# Patient Record
Sex: Female | Born: 1940
Health system: Southern US, Community
[De-identification: ages and names within clinical notes are randomized; demographics above are authoritative.]

## PROBLEM LIST (undated history)

## (undated) DIAGNOSIS — I10 Essential (primary) hypertension: Secondary | ICD-10-CM

## (undated) DIAGNOSIS — H539 Unspecified visual disturbance: Secondary | ICD-10-CM

## (undated) HISTORY — PX: CHOLECYSTECTOMY, LAPAROSCOPIC: SHX56

## (undated) HISTORY — DX: Unspecified visual disturbance: H53.9

## (undated) NOTE — *Deleted (*Deleted)
Postherpetic Neuralgia Postherpetic neuralgia (PHN) is nerve pain that occurs after a shingles infection. Shingles is a painful rash that appears on one area of the body, usually on the trunk or face. Shingles is caused by the varicella-zoster virus. This is the same virus that causes chickenpox. In people who have had chickenpox, the virus can resurface years later and cause shingles. You may have PHN if you continue to have pain for 4 months after your shingles rash has gone away. PHN appears in the same area where you had the shingles rash. The pain usually goes away after the rash disappears. Getting a vaccination for shingles can prevent PHN. This vaccine is recommended for people older than 60. It may prevent shingles, and may also lower your risk of PHN if you do get shingles. What are the causes? This condition is caused by damage to your nerves from the varicella-zoster virus. The damage makes your nerves overly sensitive. What increases the risk? The following factors may make you more likely to develop this condition:  Being older than 60 years of age.  Having severe pain before your shingles rash starts.  Having a severe rash.  Having shingles in and around the eye area.  Having a disease that makes your body unable to fight infections (weak immune system). What are the signs or symptoms? The main symptom of this condition is pain. The pain may:  Often be very bad and may be described as stabbing, burning, or feeling like an electric shock.  Come and go or may be there all the time.  Be triggered by light touches on the skin or changes in temperature. You may have itching along with the pain. How is this diagnosed? This condition may be diagnosed based on your symptoms and your history of shingles. Lab studies and other diagnostic tests are usually not needed. How is this treated? There is no cure for this condition. Treatment for PHN will focus on pain relief.  Over-the-counter pain relievers do not usually relieve PHN pain. You may need to work with a pain specialist. Treatment may include:  Antidepressant medicines to help with pain and improve sleep.  Anti-seizure medicines to relieve nerve pain.  Strong pain relievers (opioids).  A numbing patch worn on the skin (lidocaine patch).  Botox (botulinum toxin) injections to block pain signals between nerves and muscles.  Injections of numbing medicine or anti-inflammatory medicines around irritated nerves. Follow these instructions at home:   It may take a long time to recover from PHN. Work closely with your health care provider and develop a good support system at home.  Take over-the-counter and prescription medicines only as told by your health care provider.  Do not drive or use heavy machinery while taking prescription pain medicine.  Wear loose, comfortable clothing.  Cover sensitive areas with a dressing to reduce friction from clothing rubbing on the area.  If directed, put ice on the painful area: ? Put ice in a plastic bag. ? Place a towel between your skin and the bag. ? Leave the ice on for 20 minutes, 2-3 times a day.  Talk to your health care provider if you feel depressed or desperate. Living with long-term pain can be depressing.  Keep all follow-up visits as told by your health care provider. This is important. Contact a health care provider if:  Your medicine is not helping.  You are struggling to manage your pain at home. Summary  Postherpetic neuralgia is a very painful disorder   that can occur after an episode of shingles.  The pain is often severe, burning, electric, or stabbing.  Prescription medicines can be helpful in managing persistent pain.  Getting a vaccination for shingles can prevent PHN. This vaccine is recommended for people older than 60. This information is not intended to replace advice given to you by your health care provider. Make sure  you discuss any questions you have with your health care provider. Document Revised: 03/15/2017 Document Reviewed: 06/19/2016 Elsevier Patient Education  2020 [employer] Inc.  

---

## 1998-04-28 ENCOUNTER — Ambulatory Visit (HOSPITAL_COMMUNITY): Admission: RE | Admit: 1998-04-28 | Discharge: 1998-04-28 | Payer: Self-pay | Admitting: Family Medicine

## 1998-04-28 ENCOUNTER — Encounter: Payer: Self-pay | Admitting: Family Medicine

## 1999-09-20 ENCOUNTER — Encounter: Payer: Self-pay | Admitting: Family Medicine

## 1999-09-20 ENCOUNTER — Ambulatory Visit (HOSPITAL_COMMUNITY): Admission: RE | Admit: 1999-09-20 | Discharge: 1999-09-20 | Payer: Self-pay | Admitting: Family Medicine

## 1999-11-29 ENCOUNTER — Ambulatory Visit (HOSPITAL_COMMUNITY): Admission: RE | Admit: 1999-11-29 | Discharge: 1999-11-29 | Payer: Self-pay | Admitting: *Deleted

## 1999-11-29 ENCOUNTER — Encounter: Payer: Self-pay | Admitting: *Deleted

## 1999-12-15 ENCOUNTER — Encounter: Payer: Self-pay | Admitting: General Surgery

## 1999-12-20 ENCOUNTER — Encounter (INDEPENDENT_AMBULATORY_CARE_PROVIDER_SITE_OTHER): Payer: Self-pay | Admitting: Specialist

## 1999-12-20 ENCOUNTER — Observation Stay (HOSPITAL_COMMUNITY): Admission: RE | Admit: 1999-12-20 | Discharge: 1999-12-21 | Payer: Self-pay | Admitting: General Surgery

## 2000-12-02 ENCOUNTER — Encounter: Payer: Self-pay | Admitting: Family Medicine

## 2000-12-02 ENCOUNTER — Encounter: Admission: RE | Admit: 2000-12-02 | Discharge: 2000-12-02 | Payer: Self-pay | Admitting: Family Medicine

## 2001-07-21 ENCOUNTER — Encounter: Payer: Self-pay | Admitting: Family Medicine

## 2001-07-21 ENCOUNTER — Ambulatory Visit (HOSPITAL_COMMUNITY): Admission: RE | Admit: 2001-07-21 | Discharge: 2001-07-21 | Payer: Self-pay | Admitting: Family Medicine

## 2003-03-15 ENCOUNTER — Emergency Department (HOSPITAL_COMMUNITY): Admission: EM | Admit: 2003-03-15 | Discharge: 2003-03-15 | Payer: Self-pay | Admitting: Emergency Medicine

## 2008-04-22 ENCOUNTER — Other Ambulatory Visit: Admission: RE | Admit: 2008-04-22 | Discharge: 2008-04-22 | Payer: Self-pay | Admitting: Family Medicine

## 2010-09-01 NOTE — Op Note (Signed)
Operating Room Services  Patient:    Kathleen Schneider, Kathleen Schneider                      MRN: [id number] Proc. Date: 12/20/99 Adm. Date:  [id number] Attending:  Autumn Messing S                           Operative Report  PREOPERATIVE DIAGNOSIS:  Symptomatic cholelithiasis.  POSTOPERATIVE DIAGNOSIS:  Symptomatic cholelithiasis.  PROCEDURE:  Laparoscopic cholecystectomy.  SURGEON:  Autumn Messing, M.D.  ASSISTANT:  Fenton Malling. Lucia Gaskins, M.D.  ANESTHESIA:  General endotracheal.  DESCRIPTION OF PROCEDURE:  After informed consent was obtained, the patient was brought to the operating room and placed in the supine position on the operating table.  After adequate induction of general anesthesia, the patients abdomen was prepped with Betadine and draped in the usual sterile manner.  A small transverse supraumbilical incision was made with the 15 blade knife.  This was carried down through the skin and the subcutaneous tissue, and then blunt dissection was used to identify the anterior abdominal fascia. The fascia was incised with the 15 blade knife and each end was grasped with Kocher clamps.  Kocher clamps were elevated and blunt dissection through the fat and peritoneum allowed entry into the abdominal cavity.  On digital exam, there were no adhesions superiorly in the abdominal cavity, but there did appear to be some inferior to the incision.  These were not taken down since they were not in the way.  The laparoscope was then inserted.  A 0 Vicryl pursestring stitch was placed in the fascia around this incision and a Hassan cannula was placed through this hole into the abdominal cavity.  It was then anchored with the two ends of the Vicryl pursestring.  The abdomen was then insufflated with carbon dioxide.  The laparoscope was inserted through the [facility] cannula and the liver edge was easily identified.  Next, 0.25% Marcaine was injected into the skin and subcutaneous tissue, and along the  peritoneum in the superior position and a superior midline incision was then made with the 15 blade knife through the skin and subcutaneous tissue.  A 10 mm port was then placed through this incision bluntly into the abdominal cavity under direct vision with the laparoscope.  Next, two sites were chosen for lateral port placements and these areas were injected with 0.25% Marcaine.  Small incisions were made laterally on the right side of the abdomen in these areas with the 15 blade.  Five millimeter ports were then placed through these incisions into the abdominal cavity and under direct vision with the laparoscope.  The patient was placed in the head up position and ruled slightly laterally with the left side down.  This allowed better visualization of the dome of the gallbladder.  A blunt grasper was then placed through the lateral most 5 mm port and used to grasp the dome of the gallbladder.  A blunt grasper was then placed through the other lateral port and used to grasp and retract the fundus of the gallbladder.  The [place] dissector was placed through the superior midline port.  Using the [facility] with the electrocautery, the peritoneal reflection over top it, at the base of the gallbladder was opened.  Once this was done, the [place] was used to bluntly dissect around the area of the gallbladder neck. The gallbladder neck and cystic duct junction was readily identified, and was  dissected bluntly in a circumferential manner.  The common duct was kept medial to this dissection.  Three clips were placed proximally and one distally on the cystic duct and laparoscopic scissors were used to divide the duct between the two sets of clips.  The cystic artery was then identified posterior to this, and again, dissected bluntly in a circumferential manner using the [place].  Two clips were placed proximally and one distally on the artery, and the artery was divided with  laparoscopic scissors between the two.  Next, a hook cautery was used to free the gallbladder from the liver bed using a combination of blunt dissection and sharp dissection with the Bovie electrocautery.  Before removing the gallbladder completely from the liver bed, the liver bed was inspected and there were several places that required use of the electrocautery for hemostasis.  One small area required placement of Surgicel in it and holding the pressure for several minutes.  The gallbladder was then removed the rest of the way from the liver bed using the Bovie electrocautery.  The laparoscope was then moved to the superior midline port and the endoscopic bag was placed through the [facility] cannula.  Under direct vision, the gallbladder was placed in the endoscopic bag and removed from the abdomen through the umbilical port.  The Hassan cannula was then replaced through the umbilical port and the liver bed was inspected once more and found to be hemostatic.  The abdomen was irrigated with copious amounts of saline.  The ports were removed under direct vision and were found to be hemostatic.  The supraumbilical port was closed with the previously placed Vicryl pursestring suture.  The skin was closed with interrupted subcuticular stitches using 4-0 Monocryl.  Benzoin and Steri-Strips were applied.  The patient tolerated the procedure well.  At the end of the case, all needle, sponge, and instrument counts were correct.  The patient was taken to the recovery room in stable condition. DD:  12/20/99 TD:  12/21/99 Job: [id number]

## 2011-02-23 ENCOUNTER — Other Ambulatory Visit (HOSPITAL_COMMUNITY)
Admission: RE | Admit: 2011-02-23 | Discharge: 2011-02-23 | Disposition: A | Discharge status: Home / Self Care | Payer: Self-pay | Source: Ambulatory Visit | Attending: Family Medicine | Admitting: Family Medicine

## 2011-02-23 ENCOUNTER — Other Ambulatory Visit: Payer: Self-pay | Admitting: Family Medicine

## 2011-02-23 DIAGNOSIS — Z124 Encounter for screening for malignant neoplasm of cervix: Secondary | ICD-10-CM | POA: Insufficient documentation

## 2015-02-18 ENCOUNTER — Ambulatory Visit (INDEPENDENT_AMBULATORY_CARE_PROVIDER_SITE_OTHER): Payer: PPO | Admitting: Neurology

## 2015-02-18 ENCOUNTER — Telehealth: Payer: Self-pay

## 2015-02-18 ENCOUNTER — Encounter: Payer: Self-pay | Admitting: Neurology

## 2015-02-18 ENCOUNTER — Ambulatory Visit: Payer: Self-pay | Admitting: Neurology

## 2015-02-18 VITALS — BP 104/66 | HR 78 | Resp 20 | Ht 64.0 in | Wt 235.0 lb

## 2015-02-18 DIAGNOSIS — B0229 Other postherpetic nervous system involvement: Secondary | ICD-10-CM | POA: Insufficient documentation

## 2015-02-18 DIAGNOSIS — G4700 Insomnia, unspecified: Secondary | ICD-10-CM | POA: Diagnosis not present

## 2015-02-18 DIAGNOSIS — R0683 Snoring: Secondary | ICD-10-CM

## 2015-02-18 DIAGNOSIS — R208 Other disturbances of skin sensation: Secondary | ICD-10-CM | POA: Insufficient documentation

## 2015-02-18 MED ORDER — LAMOTRIGINE 100 MG PO TABS: 100.0000 mg | ORAL_TABLET | Freq: Every day | ORAL | Status: DC

## 2015-02-18 MED ORDER — LAMOTRIGINE 25 MG PO TABS: ORAL_TABLET | ORAL | Status: DC

## 2015-02-18 MED ORDER — LIDOCAINE 5 % EX PTCH
2.0000 | MEDICATED_PATCH | CUTANEOUS | Status: DC
Start: 2015-02-18 — End: 2015-05-24

## 2015-02-18 NOTE — Progress Notes (Signed)
[facility]  PATIENT: Kathleen Schneider DOB: Jul 04, 1940  REFERRING DOCTOR OR PCP:  Dr. Nancy Fetter SOURCE: patient and records from Dr. Nancy Fetter  _________________________________   HISTORICAL  CHIEF COMPLAINT:  Chief Complaint  Patient presents with  . Postherpetic Neuralgia    Sts. she had shingles in August 2014, since then has had pain right lower back radiating around toright hip, and right lateral rib pain.  Sts. pain is aggravated by waistband of pants.  Sts. she is currently taking Gabapentin 300mg  tid and sts. this brings pain from an 8 to a 5 on 1-10 pain scale.  She would like to discuss other tx. options that might provide more relief/fim.    HISTORY OF PRESENT ILLNESS:   I had the pleasure seeing you patient, Kathleen Schneider, at [facility] for a neurologic consultation regarding her post herpetic neuralgia.     In mid 2014, she had the onset of lower thoracic pain on the right.   She saw her PCP 2 weeks later and was told that she had also had evidence of a recent rash in that distribution.   Pain has been persistent.   She notes that touch feels like gait burning prickly pain.    Clothes rubbing against her lower abdomen/flank increases the pain.   She was prescribed gabapentin.  She feels it helps pain to reduce from an 8 to a 5/10.   She takes 300 x 3 tablets most days (7 am, 3 pm, 11 pm) but sometimes will take a 4th pill if she will be out of the house/.   She generally tolerates it well but is sometimes sleepy.   She has never tried Lidoderm and has not tried any other medication besides gabapentin.     LBP:   She sometimes has pain in her back, unrelatred to her flank pain from the PHN.  Insomnia:  She usually falls asleep easily but often wakes up and has trouble falling back asleep.  She wakes up mildly unrefreshed many days.    She often naps later in the day and occasionally dozes off watching TV.Marland Kitchen  She has been told that she snores  She has  never been told she snorts or gasps.   However, she wakes up catching her breath  on rare occasions    REVIEW OF SYSTEMS: Constitutional: No fevers, chills, sweats, or change in appetite Eyes: No visual changes, double vision, eye pain Ear, nose and throat: No hearing loss, ear pain, nasal congestion, sore throat Cardiovascular: No chest pain, palpitations Respiratory: No shortness of breath at rest or with exertion.   No wheezes GastrointestinaI: No nausea, vomiting, diarrhea, abdominal pain, fecal incontinence Genitourinary: No dysuria, urinary retention or frequency.  No nocturia. Musculoskeletal: No neck pain, back pain Integumentary: No rash, pruritus, skin lesions Neurological: as above Psychiatric: No depression at this time.  No anxiety Endocrine: No palpitations, diaphoresis, change in appetite, change in weigh or increased thirst Hematologic/Lymphatic: No anemia, purpura, petechiae. Allergic/Immunologic: No itchy/runny eyes, nasal congestion, recent allergic reactions, rashes  ALLERGIES: No Known Allergies  HOME MEDICATIONS:  Current outpatient prescriptions:  .  aspirin 325 MG tablet, Take 325 mg by mouth daily., Disp: , Rfl:  .  gabapentin (NEURONTIN) 300 MG capsule, Take 300 mg by mouth 3 (three) times daily., Disp: , Rfl:   PAST MEDICAL HISTORY: Past Medical History  Diagnosis Date  . Vision abnormalities     PAST SURGICAL HISTORY: Past Surgical History  Procedure Laterality Date  .  Cholecystectomy, laparoscopic      FAMILY HISTORY: Family History  Problem Relation Age of Onset  . Seizures Mother   . Alcoholism Father     SOCIAL HISTORY:  Social History   Social History  . Marital Status: Married    Spouse Name: N/A  . Number of Children: N/A  . Years of Education: N/A   Occupational History  . Not on file.   Social History Main Topics  . Smoking status: Former Research scientist (life sciences)  . Smokeless tobacco: Not on file  . Alcohol Use: 0.0 oz/week    0  Standard drinks or equivalent per week     Comment: Rare--about twice per yr/fim  . Drug Use: No  . Sexual Activity: Not on file   Other Topics Concern  . Not on file   Social History Narrative  . No narrative on file     PHYSICAL EXAM  Filed Vitals:   02/18/15 1029  BP: 104/66  Pulse: 78  Resp: 20  Height: 5\' 4"  (1.626 m)  Weight: 235 lb (106.595 kg)    Body mass index is 40.32 kg/(m^2).   General: The patient is well-developed and well-nourished and in no acute distress  Neck: The neck is supple, no carotid bruits are noted.  The neck is nontender.  Cardiovascular: The heart has a regular rate and rhythm with a normal S1 and S2. There were no murmurs, gallops or rubs.   Skin: No rashes.  Musculoskeletal:  Back is nontender  Neurologic Exam  Mental status: The patient is alert and oriented x 3 at the time of the examination. The patient has apparent normal recent and remote memory, with an apparently normal attention span and concentration ability.   Speech is normal.  Cranial nerves: Extraocular movements are full.   There is good facial sensation to soft touch bilaterally.Facial strength is normal.  Trapezius and sternocleidomastoid strength is normal. No dysarthria is noted.  The tongue is midline, and the patient has symmetric elevation of the soft palate. No obvious hearing deficits are noted.  Motor:  Muscle bulk is normal.   Tone is normal. Strength is  5 / 5 in all 4 extremities.   Sensory: Sensory testing is intact to pinprick, soft touch and vibration sensation in  The limbs. She has  Hyperpathia and allodynia over the right T11 dermatome  Coordination: Cerebellar testing reveals good finger-nose-finger and heel-to-shin bilaterally.  Gait and station: Station is normal.   Gait is normal. Tandem gait is normal. Romberg is negative.   Reflexes: Deep tendon reflexes are symmetric and normal bilaterally.   Plantar responses are flexor.    DIAGNOSTIC DATA  (LABS, IMAGING, TESTING) - I reviewed patient records, labs, notes, testing and imaging myself where available.      ASSESSMENT AND PLAN  Postherpetic neuralgia  Dysesthesia  Insomnia  Snoring  In summary, Marylisa Kalivas is a 74 year old woman with postherpetic neuralgia for the past 2 years that has not responded well enough 2 gabapentin. The dermatomal distribution appears to be the right T11 dermatome though cannot rule out that it is actually T10 or T12.     We discussed various options. She is most interested in a procedure that would hopefully get rid of the pain permanently. I think that she should try some other treatments before going down that route. I have prescribed Lidoderm patches and will also titrate lamotrigine up to 100 mg by mouth twice a day as it is sometimes more beneficial than gabapentin for  PHN. She will continue on the gabapentin. If this interaction is not beneficial, I could refer her to a pain management Center for  Pulsed RFA of the affected dorsal root ganglion or other procedure.  A second problem is insomnia, snoring and sleepiness. I discussed with her that she could have obstructive sleep apnea and I would consider getting a sleep study if her excessive daytime sleepiness worsens.  She will return to see me in 3 months or sooner if there are new or worsening neurologic symptoms.  Thank you for asking me to see Mrs. Oestreicher for a neurologic consultation. Please let me know if I can be of further assistance with her or other patients in the future.    Isak Sotomayor A. Felecia Shelling, MD, PhD [id number], [id number] AM Certified in Neurology, Clinical Neurophysiology, Sleep Medicine, Pain Medicine and Neuroimaging  [facility] [address] [place], [place] [address] [phone]

## 2015-02-18 NOTE — Patient Instructions (Signed)
The pharmacy has the prescription of lamotrigine 25 mg. Please take it as follows: For 1 week take 1 pill once a day. The second week, take 1 pill twice a day. The third week, take 1 pill in the morning and 2 at bedtime or evening. The fourth week take 2 pills twice a day.  I have also give you a paper prescription for lamotrigine 100 mg tablets after the first 4 weeks you should take 100 mg twice a day.  Most people tolerate lamotrigine very well. Some will get a rash. If you do get a significant rash stop the medicine immediately and let us know.

## 2015-02-18 NOTE — Telephone Encounter (Signed)
Called pt to ask her to r/s her appt for today. Dr. Jaynee Eagles is not feeling well. Home number listed kept ringing busy. Left message on a cell phone asking her to call us back as soon as she gets the message.

## 2015-03-22 ENCOUNTER — Telehealth: Payer: Self-pay | Admitting: Neurology

## 2015-03-22 MED ORDER — LAMOTRIGINE 100 MG PO TABS: 100.0000 mg | ORAL_TABLET | Freq: Two times a day (BID) | ORAL | Status: DC

## 2015-03-22 NOTE — Telephone Encounter (Signed)
It appears the 25mg  was a starting titration dose.  Patient is to transition to 100mg  tabs once titration os complete, a Rx was written at last OV. (Ov note says: lamotrigine up to 100 mg by mouth twice a day)  I called back and spoke with the patient.  She asked that we reprocess Rx, as she does not have the Rx written on 11/04.  Rx has been resent.  Receipt confirmed by pharmacy.

## 2015-03-22 NOTE — Telephone Encounter (Signed)
Pt called requesting refill for lamoTRIgine (LAMICTAL) 25 MG tablet .

## 2015-05-23 DIAGNOSIS — Z6841 Body Mass Index (BMI) 40.0 and over, adult: Secondary | ICD-10-CM | POA: Diagnosis not present

## 2015-05-23 DIAGNOSIS — B0229 Other postherpetic nervous system involvement: Secondary | ICD-10-CM | POA: Diagnosis not present

## 2015-05-24 ENCOUNTER — Encounter: Payer: Self-pay | Admitting: *Deleted

## 2015-05-24 ENCOUNTER — Telehealth: Payer: Self-pay | Admitting: Neurology

## 2015-05-24 ENCOUNTER — Ambulatory Visit (INDEPENDENT_AMBULATORY_CARE_PROVIDER_SITE_OTHER): Payer: PPO | Admitting: Neurology

## 2015-05-24 ENCOUNTER — Encounter: Payer: Self-pay | Admitting: Neurology

## 2015-05-24 VITALS — BP 132/78 | HR 72 | Resp 20 | Ht 64.0 in | Wt 247.0 lb

## 2015-05-24 DIAGNOSIS — G4719 Other hypersomnia: Secondary | ICD-10-CM | POA: Diagnosis not present

## 2015-05-24 DIAGNOSIS — R0683 Snoring: Secondary | ICD-10-CM

## 2015-05-24 DIAGNOSIS — B0229 Other postherpetic nervous system involvement: Secondary | ICD-10-CM | POA: Diagnosis not present

## 2015-05-24 DIAGNOSIS — R208 Other disturbances of skin sensation: Secondary | ICD-10-CM

## 2015-05-24 DIAGNOSIS — G4700 Insomnia, unspecified: Secondary | ICD-10-CM

## 2015-05-24 MED ORDER — LAMOTRIGINE 150 MG PO TABS: 150.0000 mg | ORAL_TABLET | Freq: Two times a day (BID) | ORAL | Status: DC

## 2015-05-24 MED ORDER — LIDOCAINE 5 % EX PTCH: 2.0000 | MEDICATED_PATCH | CUTANEOUS | Status: DC

## 2015-05-24 NOTE — Patient Instructions (Signed)
Pick up the new lamotrigine prescription. Take 150 mg twice a day.  If you want to try a higher dose given [name] a call and we can call in the 200 mg to take twice a day.  If you're daytime sleepiness worsens, reconsider getting a sleep study.  I will see you back in 4 months but call sooner if you have any problems.

## 2015-05-24 NOTE — Progress Notes (Signed)
[facility]  PATIENT: Kathleen Schneider DOB: 1941-04-03  REFERRING DOCTOR OR PCP:  Dr. Nancy Fetter SOURCE: patient and records from Dr. Nancy Fetter  _________________________________   HISTORICAL  CHIEF COMPLAINT:  Chief Complaint  Patient presents with  . Postherpetic Neuralgia    Sts. she has continued Gabapentin bid, and is taking Lamictal as rx'd.  Lidoderm patches weren't covered by insurance so she didn't start them.  She sts. pain is about 50% better./fim    HISTORY OF PRESENT ILLNESS:  Kathleen Schneider is a 75 year old woman with post herpetic neuralgia.     PHN Dysesthesia:  She has PHN pain in the lower thoracic region on the right.  The addition of Lamictal to her gabapentin has resulted in about a 50% improvement of her pain.  The area that is painful seems smaller.     Lidoderm patches were too expensive so she has not tried those.    She reduced gabapentin to bid so both med's, would be bid.   Although pain is better, she still has allodynia and close or anything rubbing against her right flank hurts.  PHN / Dysesthesia histoet:   In mid 2014, she had the onset of lower thoracic pain on the right.   She saw her PCP 2 weeks later and was told that she had also had evidence of a recent rash in that distribution.   Pain has been persistent.   She notes that touch feels like gait burning prickly pain.    Clothes rubbing against her lower abdomen/flank increases the pain.   She was prescribed gabapentin.  She felt it helped reduce from an 8 to a 5/10.   It made her sleepy.   Lamotrigine was added with benefit.     Insomnia:  She feels she is sleeping deeper on current medications.    She has nocturia x 3.   She often wakes up and has trouble falling back asleep once awake.    She falls asleep easily at the sleep onset.    She wakes up mildly unrefreshed many days.    She often naps later in the day and occasionally dozes off watching TV.Marland Kitchen  She has been told that she snores  She  occasioanally wakes up catching her breath  on rare occasions    Excessive daytime sleepiness EPWORTH SLEEPINESS SCALE  On a scale of 0 - 3 what is the chance of dozing:  Sitting and Reading:   3 Watching TV (IPAD):   2 Sitting inactive in a public place: 2 Passenger in car for one hour: 3 Lying down to rest in the afternoon: 3 Sitting and talking to someone: 0 Sitting quietly after lunch:  3 In a car, stopped in traffic:  0  Total (out of 24):    16/24  (moderatre sleepiness)   REVIEW OF SYSTEMS: Constitutional: No fevers, chills, sweats, or change in appetite Eyes: No visual changes, double vision, eye pain Ear, nose and throat: No hearing loss, ear pain, nasal congestion, sore throat Cardiovascular: No chest pain, palpitations Respiratory: No shortness of breath at rest or with exertion.   No wheezes GastrointestinaI: No nausea, vomiting, diarrhea, abdominal pain, fecal incontinence Genitourinary: No dysuria, urinary retention or frequency.  No nocturia. Musculoskeletal: No neck pain, back pain Integumentary: No rash, pruritus, skin lesions Neurological: as above Psychiatric: No depression at this time.  No anxiety Endocrine: No palpitations, diaphoresis, change in appetite, change in weigh or increased thirst Hematologic/Lymphatic: No anemia, purpura, petechiae. Allergic/Immunologic:  No itchy/runny eyes, nasal congestion, recent allergic reactions, rashes  ALLERGIES: No Known Allergies  HOME MEDICATIONS:  Current outpatient prescriptions:  .  gabapentin (NEURONTIN) 300 MG capsule, Take 300 mg by mouth 3 (three) times daily., Disp: , Rfl:  .  lamoTRIgine (LAMICTAL) 100 MG tablet, Take 1 tablet (100 mg total) by mouth 2 (two) times daily., Disp: 60 tablet, Rfl: 5 .  aspirin 325 MG tablet, Take 325 mg by mouth daily. Reported on 05/24/2015, Disp: , Rfl:  .  lidocaine (LIDODERM) 5 %, Place 2 patches onto the skin daily. Remove & Discard patch within 12 hours or as  directed by MD (Patient not taking: Reported on 05/24/2015), Disp: 60 patch, Rfl: 5  PAST MEDICAL HISTORY: Past Medical History  Diagnosis Date  . Vision abnormalities     PAST SURGICAL HISTORY: Past Surgical History  Procedure Laterality Date  . Cholecystectomy, laparoscopic      FAMILY HISTORY: Family History  Problem Relation Age of Onset  . Seizures Mother   . Alcoholism Father     SOCIAL HISTORY:  Social History   Social History  . Marital Status: Married    Spouse Name: N/A  . Number of Children: N/A  . Years of Education: N/A   Occupational History  . Not on file.   Social History Main Topics  . Smoking status: Former Research scientist (life sciences)  . Smokeless tobacco: Not on file  . Alcohol Use: 0.0 oz/week    0 Standard drinks or equivalent per week     Comment: Rare--about twice per yr/fim  . Drug Use: No  . Sexual Activity: Not on file   Other Topics Concern  . Not on file   Social History Narrative     PHYSICAL EXAM  Filed Vitals:   05/24/15 0850  BP: 132/78  Pulse: 72  Resp: 20  Height: 5\' 4"  (1.626 m)  Weight: 247 lb (112.038 kg)    Body mass index is 42.38 kg/(m^2).   General: The patient is well-developed and well-nourished and in no acute distress  HEENT:    She has a non-erythematous pharynx.    Soft palate is low lying.  Mallampati 3.    Skin: No rashes.  Musculoskeletal:  Back is nontender  Neurologic Exam  Mental status: The patient is alert and oriented x 3 at the time of the examination. The patient has apparent normal recent and remote memory, with an apparently normal attention span and concentration ability.   Speech is normal.  Cranial nerves: Extraocular movements are full.   There is good facial sensation to soft touch bilaterally.Facial strength is normal.  Trapezius and sternocleidomastoid strength is normal. No dysarthria is noted.  The tongue is midline, and the patient has symmetric elevation of the soft palate. No obvious hearing  deficits are noted.  Motor:  Muscle bulk is normal.   Tone is normal. Strength is  5 / 5 in all 4 extremities.   Sensory: Sensory testing is intact to pinprick, soft touch and vibration sensation in her limbs. She has  hyperpathia and allodynia over the right T11 or T12 dermatome  Coordination: Cerebellar testing reveals good finger-nose-finger and heel-to-shin bilaterally.  Gait and station: Station is normal.   Gait is normal. Tandem gait is normal. Romberg is negative.   Reflexes: Deep tendon reflexes are symmetric and normal bilaterally.         DIAGNOSTIC DATA (LABS, IMAGING, TESTING) - I reviewed patient records, labs, notes, testing and imaging myself where available.  ASSESSMENT AND PLAN   Postherpetic neuralgia  Dysesthesia  Insomnia  Snoring  Excessive daytime sleepiness   1.   Increase lamotrigine to 150 mg po bid and consider increase to 200 mg po bid depending on results (advised her to call in one month if she wants higher dose).   Can also increase gabapentin further.   2.   We discussed probable OSA but she would not wear CPAP so prefers not to be tested at this point.   Will reconsider if excessive sleepiness worsens.   I also discussed HST as option to PSG split night.    Try to lose weight as that might help.    3.   She will return to see me in 4 months or sooner if there are new or worsening neurologic symptoms.    Richard A. Felecia Shelling, MD, PhD [id number], [id number] Certified in Neurology, Clinical Neurophysiology, Sleep Medicine, Pain Medicine and Neuroimaging  [facility] [address] [place], [place] [address] [phone]

## 2015-05-24 NOTE — Telephone Encounter (Signed)
Patient is calling and states she needs a new Rx Lidocaine 5% as she never had the other prescription filled sent to [facility] on [address]. Also, she said she was looking at the list of medicaiton she takes at her appointment today and aspirins needs to be removed, also Gabapentin 300 mg needs to be changed to 2 X day.  Thanks!

## 2015-05-24 NOTE — Telephone Encounter (Signed)
New rx. for Lidocaine patches sent to [facility] on [address].  I have removed ASA from her med list an noted that she is only taking Gabapentin bid/fim

## 2015-05-25 ENCOUNTER — Encounter: Payer: Self-pay | Admitting: *Deleted

## 2015-05-25 NOTE — Telephone Encounter (Signed)
[facility] called to request a prior auth be done for medication lidocaine (LIDODERM) 5 % May call [phone]

## 2015-05-26 NOTE — Telephone Encounter (Signed)
Lidoderm patches are actually FDA indicated for post herpetic neuralgia so maybe she is able to get free medicine from the drug company.   If we can't get this, we can have her try lidocaine ointment

## 2015-05-26 NOTE — Telephone Encounter (Signed)
PA form for Lidoderm patches was completed and faxed to [facility] yesterday/fim

## 2015-05-31 ENCOUNTER — Encounter: Payer: Self-pay | Admitting: *Deleted

## 2015-09-21 ENCOUNTER — Encounter: Payer: Self-pay | Admitting: Neurology

## 2015-09-21 ENCOUNTER — Ambulatory Visit (INDEPENDENT_AMBULATORY_CARE_PROVIDER_SITE_OTHER): Payer: PPO | Admitting: Neurology

## 2015-09-21 VITALS — BP 138/88 | HR 74 | Resp 16 | Ht 64.0 in | Wt 242.0 lb

## 2015-09-21 DIAGNOSIS — G4719 Other hypersomnia: Secondary | ICD-10-CM

## 2015-09-21 DIAGNOSIS — R208 Other disturbances of skin sensation: Secondary | ICD-10-CM

## 2015-09-21 DIAGNOSIS — B0229 Other postherpetic nervous system involvement: Secondary | ICD-10-CM | POA: Diagnosis not present

## 2015-09-21 DIAGNOSIS — R0683 Snoring: Secondary | ICD-10-CM

## 2015-09-21 DIAGNOSIS — G4700 Insomnia, unspecified: Secondary | ICD-10-CM | POA: Diagnosis not present

## 2015-09-21 MED ORDER — LAMOTRIGINE 200 MG PO TABS: 200.0000 mg | ORAL_TABLET | Freq: Two times a day (BID) | ORAL | Status: DC

## 2015-09-21 NOTE — Progress Notes (Signed)
[facility]  PATIENT: Kathleen Schneider DOB: 1940-11-13  REFERRING DOCTOR OR PCP:  Dr. Nancy Fetter SOURCE: patient and records from Dr. Nancy Fetter  _________________________________   HISTORICAL  CHIEF COMPLAINT:  Chief Complaint  Patient presents with  . Post-herpetic Neuralgia    Sts. pain is improved--more than 50% with increase in lamictal and Lidocaine patches.  Today she would like to discuss bladder issues--both urinary frequency with occasional leaking if she doesn't make it to the bathroom in time, and difficutly emptying her bladder.  These are not new issues--she has just never been eval for them/fim    HISTORY OF PRESENT ILLNESS:  Kathleen Schneider is a 75 year old woman with post herpetic neuralgia and insomnia  PHN Dysesthesia:  She has PHN pain in the lower thoracic region on the right.  Increasing the lamotrigine to 150 mg po bid.     She was able to reduce gabapantin to just 2 a day.  She continue son Lidoderm .  The area that is painful seems to have shrunk .    She still has allodynia and close or anything rubbing against her right flank hurts.  PHN / Dysesthesia history from initial visit:   In mid 2014, she had the onset of lower thoracic pain on the right.   She saw her PCP 2 weeks later and was told that she had also had evidence of a recent rash in that distribution.   Pain has been persistent.   She notes that touch feels like gait burning prickly pain.    Clothes rubbing against her lower abdomen/flank increases the pain.   She was prescribed gabapentin.  She felt it helped reduce from an 8 to a 5/10.   It made her sleepy.   Lamotrigine was added with benefit.     Insomnia:  She has insomnia if she takes a nap (which is common).    She has nocturia x 3.   She often wakes up to urinate and has trouble falling back asleep once awake.  She has mixed urinary hesitancy and frequency  She falls asleep easily at the sleep onset.   She has been told that she snores  She  occasioanally wakes up catching her breath  on rare occasions    Excessive daytime sleepiness: EPWORTH SLEEPINESS SCALE  On a scale of 0 - 3 what is the chance of dozing:  Sitting and Reading:   3 Watching TV (IPAD):   2 Sitting inactive in a public place: 2 Passenger in car for one hour: 3 Lying down to rest in the afternoon: 3 Sitting and talking to someone: 0 Sitting quietly after lunch:  3 In a car, stopped in traffic:  0  Total (out of 24):    16/24  (moderatre sleepiness)   REVIEW OF SYSTEMS: Constitutional: No fevers, chills, sweats, or change in appetite.   She has excessive sleepiness. Eyes: No visual changes, double vision, eye pain Ear, nose and throat: No hearing loss, ear pain, nasal congestion, sore throat Cardiovascular: No chest pain, palpitations Respiratory: No shortness of breath at rest or with exertion.   No wheezes.   Has snoring GastrointestinaI: No nausea, vomiting, diarrhea, abdominal pain, fecal incontinence Genitourinary: No dysuria, urinary retention or frequency.  No nocturia. Musculoskeletal: No neck pain, back pain Integumentary: No rash, pruritus, skin lesions Neurological: as above Psychiatric: No depression at this time.  No anxiety Endocrine: No palpitations, diaphoresis, change in appetite, change in weigh or increased thirst Hematologic/Lymphatic: No anemia,  purpura, petechiae. Allergic/Immunologic: No itchy/runny eyes, nasal congestion, recent allergic reactions, rashes  ALLERGIES: No Known Allergies  HOME MEDICATIONS:  Current outpatient prescriptions:  .  gabapentin (NEURONTIN) 300 MG capsule, Take 300 mg by mouth 3 (three) times daily., Disp: , Rfl:  .  lamoTRIgine (LAMICTAL) 150 MG tablet, Take 1 tablet (150 mg total) by mouth 2 (two) times daily., Disp: 60 tablet, Rfl: 11 .  lidocaine (LIDODERM) 5 %, Place 2 patches onto the skin daily. Remove & Discard patch within 12 hours or as directed by MD, Disp: 60 patch, Rfl: 5  PAST  MEDICAL HISTORY: Past Medical History  Diagnosis Date  . Vision abnormalities     PAST SURGICAL HISTORY: Past Surgical History  Procedure Laterality Date  . Cholecystectomy, laparoscopic      FAMILY HISTORY: Family History  Problem Relation Age of Onset  . Seizures Mother   . Alcoholism Father     SOCIAL HISTORY:  Social History   Social History  . Marital Status: Married    Spouse Name: N/A  . Number of Children: N/A  . Years of Education: N/A   Occupational History  . Not on file.   Social History Main Topics  . Smoking status: Former Research scientist (life sciences)  . Smokeless tobacco: Not on file  . Alcohol Use: 0.0 oz/week    0 Standard drinks or equivalent per week     Comment: Rare--about twice per yr/fim  . Drug Use: No  . Sexual Activity: Not on file   Other Topics Concern  . Not on file   Social History Narrative     PHYSICAL EXAM  Filed Vitals:   09/21/15 0924  BP: 138/88  Pulse: 74  Resp: 16  Height: 5\' 4"  (1.626 m)  Weight: 242 lb (109.77 kg)    Body mass index is 41.52 kg/(m^2).   General: The patient is well-developed and well-nourished and in no acute distress  HEENT:    She has a non-erythematous pharynx.    Soft palate is low lying and pharynx is Mallampati 3.    Skin: No rashes.  Musculoskeletal:  Back is nontender  Neurologic Exam  Mental status: The patient is alert and oriented x 3 at the time of the examination. The patient has apparent normal recent and remote memory, with an apparently normal attention span and concentration ability.   Speech is normal.  Cranial nerves: Extraocular movements are full.   There is good facial sensation to soft touch bilaterally.Facial strength is normal.  Trapezius and sternocleidomastoid strength is normal. No dysarthria is noted.  The tongue is midline, and the patient has symmetric elevation of the soft palate. No obvious hearing deficits are noted.  Motor:  Muscle bulk is normal.   Tone is normal.  Strength is  5 / 5 in all 4 extremities.   Sensory: Sensory testing is intact to pinprick, soft touch and vibration sensation in her limbs. She has  hyperpathia and allodynia over the right T11 or T12 dermatome  Gait and station: Station is normal.   Gait is normal. Tandem gait is slightly wide, likely normal for age.. Romberg is negative.   Reflexes: Deep tendon reflexes are symmetric and normal bilaterally.         DIAGNOSTIC DATA (LABS, IMAGING, TESTING) - I reviewed patient records, labs, notes, testing and imaging myself where available.      ASSESSMENT AND PLAN   Postherpetic neuralgia  Dysesthesia   1.   Increase lamotrigine to 200 mg po bid.  Due to sleepiness, we won't  increase gabapentin further.   2.   We discussed probable OSA but she would not wear CPAP so prefers not to be tested at this point.   Will reconsider if excessive sleepiness worsens.   I also discussed HST as option to PSG split night.    Try to lose weight as that might help.    3.   She will return to see me in 4 months or sooner if there are new or worsening neurologic symptoms.    Richard A. Felecia Shelling, MD, PhD [id number], 0000000 AM Certified in Neurology, Clinical Neurophysiology, Sleep Medicine, Pain Medicine and Neuroimaging  [facility] [address] [place], [place] [address] [phone]

## 2016-01-06 DIAGNOSIS — Z1389 Encounter for screening for other disorder: Secondary | ICD-10-CM | POA: Diagnosis not present

## 2016-01-06 DIAGNOSIS — Z23 Encounter for immunization: Secondary | ICD-10-CM | POA: Diagnosis not present

## 2016-01-06 DIAGNOSIS — D1723 Benign lipomatous neoplasm of skin and subcutaneous tissue of right leg: Secondary | ICD-10-CM | POA: Diagnosis not present

## 2016-01-06 DIAGNOSIS — R05 Cough: Secondary | ICD-10-CM | POA: Diagnosis not present

## 2016-03-12 ENCOUNTER — Other Ambulatory Visit: Payer: Self-pay | Admitting: Neurology

## 2016-03-12 NOTE — Telephone Encounter (Signed)
Pt request refill for lidocaine (LIDODERM) 5 %

## 2016-03-22 ENCOUNTER — Ambulatory Visit: Payer: PPO | Admitting: Neurology

## 2016-03-27 ENCOUNTER — Ambulatory Visit (INDEPENDENT_AMBULATORY_CARE_PROVIDER_SITE_OTHER): Payer: PPO | Admitting: Neurology

## 2016-03-27 ENCOUNTER — Encounter: Payer: Self-pay | Admitting: Neurology

## 2016-03-27 DIAGNOSIS — R208 Other disturbances of skin sensation: Secondary | ICD-10-CM

## 2016-03-27 DIAGNOSIS — G4700 Insomnia, unspecified: Secondary | ICD-10-CM

## 2016-03-27 DIAGNOSIS — M545 Low back pain, unspecified: Secondary | ICD-10-CM

## 2016-03-27 DIAGNOSIS — G4719 Other hypersomnia: Secondary | ICD-10-CM

## 2016-03-27 DIAGNOSIS — R0683 Snoring: Secondary | ICD-10-CM

## 2016-03-27 DIAGNOSIS — B0229 Other postherpetic nervous system involvement: Secondary | ICD-10-CM | POA: Diagnosis not present

## 2016-03-27 MED ORDER — MELOXICAM 7.5 MG PO TABS: 7.5000 mg | ORAL_TABLET | Freq: Every day | ORAL | 5 refills | Status: DC

## 2016-03-27 NOTE — Progress Notes (Signed)
[facility]  PATIENT: Kathleen Schneider DOB: 1940-06-27  REFERRING DOCTOR OR PCP:  Dr. Nancy Fetter SOURCE: patient and records from Dr. Nancy Fetter  _________________________________   HISTORICAL  CHIEF COMPLAINT:  Chief Complaint  Patient presents with  . Postherpetic Neuralgia    Sts. neuralgia is improved since increasing Lamictal to 200mg  bid. She wasn't sure if she could continue Gabapentin when Lamictal was increased, so she stopped it.  She needs clarification--if she needs to continue off of Gabapentin, or can restart it./fim    HISTORY OF PRESENT ILLNESS:  Kathleen Schneider is a 75 year old woman with post herpetic neuralgia and insomnia  PHN Dysesthesia:  She has less PHN pain in the right lower thoracic region on the right on  lamotrigine 200 mg po bid.     She also takes gabapentin 300.  She is also on Lidoderm .  She has some allodynia still and clothes and rubbing against her right flank hurts.  PHN / Dysesthesia history from initial visit:   In mid 2014, she had the onset of lower thoracic pain on the right.   She saw her PCP 2 weeks later and was told that she had also had evidence of a recent rash in that distribution.   Pain has been persistent.   She notes that touch feels like gait burning prickly pain.    Clothes rubbing against her lower abdomen/flank increases the pain.   She was prescribed gabapentin.  She felt it helped reduce from an 8 to a 5/10.   It made her sleepy.   Lamotrigine was added with benefit.     LBP:   She has axial LBP that increases with prolonged standing and walking.   She finds that she stoops more when she walks and using a shopping cart helps.     Insomnia:  She has insomnia if she takes a nap (which is common).    She has nocturia x 3.   She often wakes up to urinate and has trouble falling back asleep once awake.  She has mixed urinary hesitancy and frequency  She falls asleep easily at the sleep onset.   She has been told that she snores   She occasionally wakes up catching her breath  on rare occasions.     Her Epworth score is 16/24  (moderate sleepiness)  Mood:   She has noted mild depression  REVIEW OF SYSTEMS: Constitutional: No fevers, chills, sweats, or change in appetite.   She has excessive sleepiness.and insomnia Eyes: No visual changes, double vision, eye pain Ear, nose and throat: No hearing loss, ear pain, nasal congestion, sore throat Cardiovascular: No chest pain, palpitations Respiratory: No shortness of breath at rest or with exertion.   No wheezes.   Has snoring GastrointestinaI: No nausea, vomiting, diarrhea, abdominal pain, fecal incontinence Genitourinary: No dysuria, urinary retention or frequency.  No nocturia. Musculoskeletal: No neck pain, back pain Integumentary: No rash, pruritus, skin lesions Neurological: as above Psychiatric: mild depression at this time.  No anxiety Endocrine: No palpitations, diaphoresis, change in appetite, change in weigh or increased thirst Hematologic/Lymphatic: No anemia, purpura, petechiae. Allergic/Immunologic: No itchy/runny eyes, nasal congestion, recent allergic reactions, rashes  ALLERGIES: No Known Allergies  HOME MEDICATIONS:  Current Outpatient Prescriptions:  .  lamoTRIgine (LAMICTAL) 200 MG tablet, Take 1 tablet (200 mg total) by mouth 2 (two) times daily., Disp: 60 tablet, Rfl: 11 .  lidocaine (LIDODERM) 5 %, Place 2 patches onto the skin daily. Remove & Discard patch  within 12 hours or as directed by MD, Disp: 60 patch, Rfl: 5 .  lidocaine (LIDODERM) 5 %, USE TWO PATCHES EXTERNALLY ONCE DAILY. REMOVE  AND DISCARD PATCH WITHIN 12 HOURS OR AS DIRECTED BY MD, Disp: 60 patch, Rfl: 5 .  gabapentin (NEURONTIN) 300 MG capsule, Take 300 mg by mouth 3 (three) times daily., Disp: , Rfl:  .  meloxicam (MOBIC) 7.5 MG tablet, Take 1 tablet (7.5 mg total) by mouth daily., Disp: 30 tablet, Rfl: 5  PAST MEDICAL HISTORY: Past Medical History:  Diagnosis Date  .  Vision abnormalities     PAST SURGICAL HISTORY: Past Surgical History:  Procedure Laterality Date  . CHOLECYSTECTOMY, LAPAROSCOPIC      FAMILY HISTORY: Family History  Problem Relation Age of Onset  . Seizures Mother   . Alcoholism Father     SOCIAL HISTORY:  Social History   Social History  . Marital status: Married    Spouse name: N/A  . Number of children: N/A  . Years of education: N/A   Occupational History  . Not on file.   Social History Main Topics  . Smoking status: Former Research scientist (life sciences)  . Smokeless tobacco: Not on file  . Alcohol use 0.0 oz/week     Comment: Rare--about twice per yr/fim  . Drug use: No  . Sexual activity: Not on file   Other Topics Concern  . Not on file   Social History Narrative  . No narrative on file     PHYSICAL EXAM  There were no vitals filed for this visit.  There is no height or weight on file to calculate BMI.   General: The patient is well-developed and well-nourished and in no acute distress   Skin: No rashes.  Musculoskeletal:  Back is mildly tender over paraspinal muscles  Neurologic Exam  Mental status: The patient is alert and oriented x 3 at the time of the examination. The patient has apparent normal recent and remote memory, with an apparently normal attention span and concentration ability.   Speech is normal.  Cranial nerves: Extraocular movements are full.   There is good facial sensation to soft touch bilaterally.Facial strength is normal.  Trapezius and sternocleidomastoid strength is normal. No dysarthria is noted.  The tongue is midline, and the patient has symmetric elevation of the soft palate. No obvious hearing deficits are noted.  Motor:  Muscle bulk is normal.   Tone is normal. Strength is  5 / 5 in all 4 extremities.   Sensory: Sensory testing is intact to pinprick, soft touch and vibration sensation in her arms. She has  hyperpathia and allodynia over the right T11 or T12 dermatome.   Mild reduced  left L5 sensation to touch  Gait and station: Station is normal.   Gait is normal. Tandem gait is slightly wide, likely normal for age.. Romberg is negative.   Reflexes: Deep tendon reflexes are symmetric and normal bilaterally.         DIAGNOSTIC DATA (LABS, IMAGING, TESTING) - I reviewed patient records, labs, notes, testing and imaging myself where available.      ASSESSMENT AND PLAN   Postherpetic neuralgia  Dysesthesia  Insomnia, unspecified type  Midline low back pain without sciatica, unspecified chronicity  Snoring  Excessive daytime sleepiness   1.   Continue lamotrigine to 200 mg po bid.    2.   She has probable OSA but she would not wear CPAP or oral appliance so prefers not to be tested at this  point.  Try to lose weight as that might help.    3.   If LBP worsens, we will check lumbar MRI to determine if there is spinal stenosis that may ned a procedure.   4.   She will return to see me in 4 months or sooner if there are new or worsening neurologic symptoms.    Azie Mcconahy A. Felecia Shelling, MD, PhD 87/18/3672, 55:00 PM Certified in Neurology, Clinical Neurophysiology, Sleep Medicine, Pain Medicine and Neuroimaging  [facility] [address] [place], [place] [address] [phone]

## 2016-08-13 ENCOUNTER — Telehealth: Payer: Self-pay | Admitting: Neurology

## 2016-08-13 MED ORDER — LIDOCAINE 5 % EX PTCH: 2.0000 | MEDICATED_PATCH | CUTANEOUS | 5 refills | Status: DC

## 2016-08-13 NOTE — Addendum Note (Signed)
Addended by: France Ravens I on: 08/13/2016 04:43 PM   Modules accepted: Orders

## 2016-08-13 NOTE — Telephone Encounter (Signed)
Lidoderm refilled as requested/fim

## 2016-08-13 NOTE — Telephone Encounter (Signed)
Patient called office requesting refills for lidocaine (LIDODERM) 5 %.

## 2016-09-25 ENCOUNTER — Encounter: Payer: Self-pay | Admitting: Neurology

## 2016-09-25 ENCOUNTER — Ambulatory Visit (INDEPENDENT_AMBULATORY_CARE_PROVIDER_SITE_OTHER): Payer: PPO | Admitting: Neurology

## 2016-09-25 VITALS — BP 146/85 | HR 94 | Resp 20 | Ht 64.0 in | Wt 229.0 lb

## 2016-09-25 DIAGNOSIS — M545 Low back pain, unspecified: Secondary | ICD-10-CM

## 2016-09-25 DIAGNOSIS — G4700 Insomnia, unspecified: Secondary | ICD-10-CM

## 2016-09-25 DIAGNOSIS — B0229 Other postherpetic nervous system involvement: Secondary | ICD-10-CM

## 2016-09-25 DIAGNOSIS — R208 Other disturbances of skin sensation: Secondary | ICD-10-CM | POA: Diagnosis not present

## 2016-09-25 DIAGNOSIS — M48061 Spinal stenosis, lumbar region without neurogenic claudication: Secondary | ICD-10-CM

## 2016-09-25 MED ORDER — LAMOTRIGINE 200 MG PO TABS: 200.0000 mg | ORAL_TABLET | Freq: Two times a day (BID) | ORAL | 11 refills | Status: DC

## 2016-09-25 MED ORDER — LIDOCAINE 5 % EX PTCH: 2.0000 | MEDICATED_PATCH | CUTANEOUS | 5 refills | Status: DC

## 2016-09-25 MED ORDER — MELOXICAM 7.5 MG PO TABS: 7.5000 mg | ORAL_TABLET | Freq: Every day | ORAL | 5 refills | Status: DC

## 2016-09-25 NOTE — Progress Notes (Signed)
[facility]  PATIENT: Kathleen Schneider DOB: December 22, 1940  REFERRING DOCTOR OR PCP:  Dr. Nancy Fetter SOURCE: patient and records from Dr. Nancy Fetter  _________________________________   HISTORICAL  CHIEF COMPLAINT:  Chief Complaint  Patient presents with  . Postherpetic Neuralgia    Sts. neuralgia (mainly right flank) cintinues to improve with Lamictal./fim    HISTORY OF PRESENT ILLNESS:  Kathleen Schneider is a 76 year old woman with post herpetic neuralgia and insomnia  PHN Dysesthesia:  She feels her PHN pain (in the right lower thoracic region) is doing bette on lamotrigine and Lidoderm.  She stopped gabapentin 300.  She is also on Lidoderm .  She has allodynia/hyperpathia triggered by clothes at times in that region.  This is also doing better.  PHN / Dysesthesia history from initial visit:   In mid 2014, she had the onset of lower thoracic pain on the right.   She saw her PCP 2 weeks later and was told that she had also had evidence of a recent rash in that distribution.   Pain has been persistent.   She notes that touch feels like gait burning prickly pain.    Clothes rubbing against her lower abdomen/flank increases the pain.   Gabapentin helped but made her sleepy.     Lamotrigine was added with benefit.     LBP:   LBP is worsening and now occurring daily.   While resting LBP is mild but it can intensify if she stands a while or with walking.   Pain is axial without radiation into legs. She finds that she stoops more when she walks and using a shopping cart helps.    She has never had any imaging.   Meloxicam helps slightly  Insomnia:  She often has insomnia, especially if she takes a nap (which is common).    She also has nocturia x 3.   She often wakes up to urinate and has trouble falling back asleep once awake.  She has mixed urinary hesitancy and frequency     She has been told that she snores  She occasionally wakes up catching her breath  on rare occasions.     Her Epworth  score is 16/24  (moderate sleepiness).   She is not interested in a PSG and would not wear CPAP or an oral appliance.  Mood:   She has noted mild depression but this has not worsened.     REVIEW OF SYSTEMS: Constitutional: No fevers, chills, sweats, or change in appetite.   EDS and insomnia Eyes: No visual changes, double vision, eye pain Ear, nose and throat: No hearing loss, ear pain, nasal congestion, sore throat Cardiovascular: No chest pain, palpitations Respiratory: No shortness of breath at rest or with exertion.   No wheezes.   She has snoring GastrointestinaI: No nausea, vomiting, diarrhea, abdominal pain, fecal incontinence Genitourinary: No dysuria, urinary retention or frequency.  No nocturia. Musculoskeletal: No neck pain, back pain Integumentary: No rash, pruritus, skin lesions Neurological: as above Psychiatric: mild depression at this time.  No anxiety Endocrine: No palpitations, diaphoresis, change in appetite, change in weigh or increased thirst Hematologic/Lymphatic: No anemia, purpura, petechiae. Allergic/Immunologic: No itchy/runny eyes, nasal congestion, recent allergic reactions, rashes  ALLERGIES: No Known Allergies  HOME MEDICATIONS:  Current Outpatient Prescriptions:  .  lamoTRIgine (LAMICTAL) 200 MG tablet, Take 1 tablet (200 mg total) by mouth 2 (two) times daily., Disp: 60 tablet, Rfl: 11 .  lidocaine (LIDODERM) 5 %, Place 2 patches onto the skin  daily. Remove & Discard patch within 12 hours or as directed by MD, Disp: 60 patch, Rfl: 5 .  meloxicam (MOBIC) 7.5 MG tablet, Take 1 tablet (7.5 mg total) by mouth daily., Disp: 30 tablet, Rfl: 5 .  gabapentin (NEURONTIN) 300 MG capsule, Take 300 mg by mouth 3 (three) times daily., Disp: , Rfl:   PAST MEDICAL HISTORY: Past Medical History:  Diagnosis Date  . Vision abnormalities     PAST SURGICAL HISTORY: Past Surgical History:  Procedure Laterality Date  . CHOLECYSTECTOMY, LAPAROSCOPIC      FAMILY  HISTORY: Family History  Problem Relation Age of Onset  . Seizures Mother   . Alcoholism Father     SOCIAL HISTORY:  Social History   Social History  . Marital status: Married    Spouse name: N/A  . Number of children: N/A  . Years of education: N/A   Occupational History  . Not on file.   Social History Main Topics  . Smoking status: Former Research scientist (life sciences)  . Smokeless tobacco: Never Used  . Alcohol use 0.0 oz/week     Comment: Rare--about twice per yr/fim  . Drug use: No  . Sexual activity: Not on file   Other Topics Concern  . Not on file   Social History Narrative  . No narrative on file     PHYSICAL EXAM  Vitals:   09/25/16 1100  BP: (!) 146/85  Pulse: 94  Resp: 20  Weight: 229 lb (103.9 kg)  Height: 5\' 4"  (1.626 m)    Body mass index is 39.31 kg/m.   General: The patient is well-developed and well-nourished and in no acute distress   Skin: No rashes.  Musculoskeletal:  Back is mildly tender over paraspinal muscles  Neurologic Exam  Mental status: The patient is alert and oriented x 3 at the time of the examination. The patient has apparent normal recent and remote memory, with an apparently normal attention span and concentration ability.   Speech is normal.  Cranial nerves: Extraocular movements are full.   There is good facial sensation to soft touch bilaterally.Facial strength is normal.  Trapezius and sternocleidomastoid strength is normal. No dysarthria is noted.  The tongue is midline, and the patient has symmetric elevation of the soft palate. No obvious hearing deficits are noted.  Motor:  Muscle bulk is normal.   Tone is normal. Strength is  5 / 5 in all 4 extremities.   Sensory:  She has intact sensation to touch and vibration in the arms. There is  hyperpathia and allodynia over the right T11 or T12 dermatome.   Mild reduced left L5 sensation to touch  Gait and station: Station is normal.   Gait is normal. Tandem gait is slightly wide, likely  normal for age.. Romberg is negative.   Reflexes: Deep tendon reflexes are symmetric and normal bilaterally.         DIAGNOSTIC DATA (LABS, IMAGING, TESTING) - I reviewed patient records, labs, notes, testing and imaging myself where available.      ASSESSMENT AND PLAN   Postherpetic neuralgia  Dysesthesia  Midline low back pain without sciatica, unspecified chronicity  Insomnia, unspecified type   1.   Postherpetic neuralgia, she will continue lamotrigine 200 mg by mouth twice a day and Lidoderm patches.  2.   We discussed her insomnia and probable sleep apnea. Even if she has OSA she would not wear CPAP or oral appliance so prefers not to be tested at this point.  We  discussed she should try to lose weight.   3.   LBP likely either facet hypertrophy or spinal stenosis and is not getting any better. Has numbness in left L5 distribution on examination. Check lumbar MRI to determine if there is spinal stenosis that may ned a procedure.    If facet hypertrophy, consider medial branch blocks/RFA.   If spinal stenosis, consider ESI. 4.   She will return to see me in 6 months or sooner if there are new or worsening neurologic symptoms.    Richard A. Felecia Shelling, MD, PhD 3/54/6568, 12:75 AM Certified in Neurology, Clinical Neurophysiology, Sleep Medicine, Pain Medicine and Neuroimaging  [facility] [address] [place], [place] [address] [phone]  She fee

## 2016-10-10 ENCOUNTER — Ambulatory Visit
Admission: RE | Admit: 2016-10-10 | Discharge: 2016-10-10 | Disposition: A | Discharge status: Home / Self Care | Payer: PPO | Source: Ambulatory Visit | Attending: Neurology | Admitting: Neurology

## 2016-10-10 DIAGNOSIS — M545 Low back pain, unspecified: Secondary | ICD-10-CM

## 2016-10-10 DIAGNOSIS — M48061 Spinal stenosis, lumbar region without neurogenic claudication: Secondary | ICD-10-CM

## 2016-10-15 ENCOUNTER — Telehealth: Payer: Self-pay | Admitting: *Deleted

## 2016-10-15 DIAGNOSIS — M48061 Spinal stenosis, lumbar region without neurogenic claudication: Secondary | ICD-10-CM

## 2016-10-15 DIAGNOSIS — M545 Low back pain: Secondary | ICD-10-CM

## 2016-10-15 DIAGNOSIS — G8929 Other chronic pain: Secondary | ICD-10-CM

## 2016-10-15 NOTE — Telephone Encounter (Signed)
Pt returned call, it was by accident that she hung up.  Pt asking for a call back

## 2016-10-15 NOTE — Telephone Encounter (Signed)
-----   Message from Britt Bottom, MD sent at 10/14/2016 12:37 PM EDT ----- MRI shows a lot of arthritis at L4L5.   If not better, we can refer to [facility] or elsewhere for L4L5 medial branch blocks / RFA

## 2016-10-15 NOTE — Telephone Encounter (Signed)
LMOM and per RAS, reveiwed MRI results as below.  If pain is no better, she should call back and will place order for MBB/RFA/fim

## 2016-10-16 NOTE — Telephone Encounter (Signed)
I have spoken with pt. this afternoon and per RAS, reviewed MRI results as below.  She verbalized understanding of same and sts. she would like to proceed with MBB.  Order placed in EPIC/fim

## 2016-10-16 NOTE — Addendum Note (Signed)
Addended by: France Ravens I on: 10/16/2016 04:17 PM   Modules accepted: Orders

## 2016-10-19 ENCOUNTER — Other Ambulatory Visit: Payer: Self-pay | Admitting: Neurology

## 2016-10-19 DIAGNOSIS — G8929 Other chronic pain: Secondary | ICD-10-CM

## 2016-10-19 DIAGNOSIS — M48062 Spinal stenosis, lumbar region with neurogenic claudication: Secondary | ICD-10-CM

## 2016-10-19 DIAGNOSIS — M545 Low back pain: Secondary | ICD-10-CM

## 2016-10-22 ENCOUNTER — Telehealth: Payer: Self-pay | Admitting: Neurology

## 2016-10-22 NOTE — Telephone Encounter (Signed)
Kathleen Schneider with [facility] just called stating she is trying to get authorization for the medial branch block and her insurance is wondering if she has had physical therapy and if not the reason why. The best number to contact Kathleen Schneider is [phone] and she leaves at 3 pm every day this week.

## 2016-10-22 NOTE — Telephone Encounter (Signed)
I have spoken with pt. to confirm that she has never had PT for lbp, and she confirmed this.  I have explained that it sounds as it her ins. co. may require PT prior to approving MBB for her.  She verbalized understanding of same and is agreeable to PT if ins. requires it.  I lmom for Anderson Malta at [facility] to let her know this--if ins. won't approve, she can cancel pt's MBB next week and we will order PT/fim

## 2016-10-23 ENCOUNTER — Telehealth: Payer: Self-pay | Admitting: Neurology

## 2016-10-23 DIAGNOSIS — G8929 Other chronic pain: Secondary | ICD-10-CM

## 2016-10-23 DIAGNOSIS — M1990 Unspecified osteoarthritis, unspecified site: Secondary | ICD-10-CM

## 2016-10-23 DIAGNOSIS — M545 Low back pain, unspecified: Secondary | ICD-10-CM

## 2016-10-23 NOTE — Telephone Encounter (Signed)
Anderson Malta with [facility] is calling to advise patient does need PT for insurance to approve injections.

## 2016-10-23 NOTE — Addendum Note (Signed)
Addended by: France Ravens I on: 10/23/2016 09:54 AM   Modules accepted: Orders

## 2016-10-23 NOTE — Telephone Encounter (Signed)
LMOM for pt. that per Anderson Malta at [facility], insurance requires she try PT before they will approve MBB.  Appt. at [facility] has been cancelled and referral for PT has been ordered/fim

## 2016-10-31 ENCOUNTER — Other Ambulatory Visit: Payer: PPO

## 2017-01-23 DIAGNOSIS — Z0000 Encounter for general adult medical examination without abnormal findings: Secondary | ICD-10-CM | POA: Diagnosis not present

## 2017-03-08 DIAGNOSIS — R05 Cough: Secondary | ICD-10-CM | POA: Diagnosis not present

## 2017-03-27 ENCOUNTER — Telehealth: Payer: Self-pay | Admitting: Neurology

## 2017-03-27 ENCOUNTER — Ambulatory Visit: Payer: PPO | Admitting: Neurology

## 2017-03-27 MED ORDER — LIDOCAINE 5 % EX PTCH: 2.0000 | MEDICATED_PATCH | CUTANEOUS | 5 refills | Status: DC

## 2017-03-27 NOTE — Telephone Encounter (Signed)
Pt calling for a new prescription for lidocaine (LIDODERM) 5 %, please send to  [facility], [address] [phone] (Phone) [phone] (Fax)

## 2017-03-27 NOTE — Telephone Encounter (Signed)
Lidoderm patches escribed to [facility] as requested/fim

## 2017-03-27 NOTE — Addendum Note (Signed)
Addended by: France Ravens I on: 03/27/2017 10:48 AM   Modules accepted: Orders

## 2017-04-03 DIAGNOSIS — B0229 Other postherpetic nervous system involvement: Secondary | ICD-10-CM | POA: Diagnosis not present

## 2017-04-03 DIAGNOSIS — M8588 Other specified disorders of bone density and structure, other site: Secondary | ICD-10-CM | POA: Diagnosis not present

## 2017-04-03 DIAGNOSIS — Z1389 Encounter for screening for other disorder: Secondary | ICD-10-CM | POA: Diagnosis not present

## 2017-04-03 DIAGNOSIS — Z23 Encounter for immunization: Secondary | ICD-10-CM | POA: Diagnosis not present

## 2017-04-03 DIAGNOSIS — N183 Chronic kidney disease, stage 3 (moderate): Secondary | ICD-10-CM | POA: Diagnosis not present

## 2017-04-03 DIAGNOSIS — Z0000 Encounter for general adult medical examination without abnormal findings: Secondary | ICD-10-CM | POA: Diagnosis not present

## 2017-05-06 ENCOUNTER — Other Ambulatory Visit: Payer: Self-pay | Admitting: Neurology

## 2017-05-13 ENCOUNTER — Encounter (INDEPENDENT_AMBULATORY_CARE_PROVIDER_SITE_OTHER): Payer: Self-pay

## 2017-05-13 ENCOUNTER — Encounter: Payer: Self-pay | Admitting: Neurology

## 2017-05-13 ENCOUNTER — Ambulatory Visit: Payer: PPO | Admitting: Neurology

## 2017-05-13 VITALS — BP 142/77 | HR 82 | Ht 64.0 in | Wt 229.0 lb

## 2017-05-13 DIAGNOSIS — M47899 Other spondylosis, site unspecified: Secondary | ICD-10-CM | POA: Insufficient documentation

## 2017-05-13 DIAGNOSIS — M47819 Spondylosis without myelopathy or radiculopathy, site unspecified: Principal | ICD-10-CM

## 2017-05-13 DIAGNOSIS — M545 Low back pain, unspecified: Secondary | ICD-10-CM

## 2017-05-13 DIAGNOSIS — B0229 Other postherpetic nervous system involvement: Secondary | ICD-10-CM

## 2017-05-13 DIAGNOSIS — M4690 Unspecified inflammatory spondylopathy, site unspecified: Secondary | ICD-10-CM

## 2017-05-13 MED ORDER — MELOXICAM 15 MG PO TABS: 15.0000 mg | ORAL_TABLET | Freq: Every day | ORAL | 5 refills | Status: DC

## 2017-05-13 NOTE — Progress Notes (Signed)
[facility]  PATIENT: Kathleen Schneider DOB: 02/02/1941  REFERRING DOCTOR OR PCP:  Dr. Nancy Fetter SOURCE: patient and records from Dr. Nancy Fetter  _________________________________   HISTORICAL  CHIEF COMPLAINT:  Chief Complaint  Patient presents with  . Follow-up    Patient reports that she is doing as well as can be expected.     HISTORY OF PRESENT ILLNESS:  Kathleen Schneider is a 77 year old woman with post herpetic neuralgia and insomnia  Update 05/13/2017:  Her PHN is much better and she continues Lamictal and Lidoderm.   However, she is experiencing pain in the lower back and down the leg.   Her lower back pain worsened more the past couple months.    ASA helps the LBP pain some.    I had referred her for facet (medial branch) blocks and RFA due to facet hypertrophy/degeneration at L4L5 but insurance is insisting that she do PT first.     She notes her LBP is worse if standing a while and better if she sits or bends forward.    She is on meloxicam 7.5 mg but feels some the benefit is short lived (2-4 hours)  From 09/25/2016: PHN Dysesthesia:  She feels her PHN pain (in the right lower thoracic region) is doing bette on lamotrigine and Lidoderm.  She stopped gabapentin 300.  She is also on Lidoderm .  She has allodynia/hyperpathia triggered by clothes at times in that region.  This is also doing better.  PHN / Dysesthesia history from initial visit:   In mid 2014, she had the onset of lower thoracic pain on the right.   She saw her PCP 2 weeks later and was told that she had also had evidence of a recent rash in that distribution.   Pain has been persistent.   She notes that touch feels like gait burning prickly pain.    Clothes rubbing against her lower abdomen/flank increases the pain.   Gabapentin helped but made her sleepy.     Lamotrigine was added with benefit.     LBP:   LBP is worsening and now occurring daily.   While resting LBP is mild but it can intensify if she  stands a while or with walking.   Pain is axial without radiation into legs. She finds that she stoops more when she walks and using a shopping cart helps.    She has never had any imaging.   Meloxicam helps slightly  Insomnia:  She often has insomnia, especially if she takes a nap (which is common).    She also has nocturia x 3.   She often wakes up to urinate and has trouble falling back asleep once awake.  She has mixed urinary hesitancy and frequency     She has been told that she snores  She occasionally wakes up catching her breath  on rare occasions.     Her Epworth score is 16/24  (moderate sleepiness).   She is not interested in a PSG and would not wear CPAP or an oral appliance.  Mood:   She has noted mild depression but this has not worsened.     REVIEW OF SYSTEMS: Constitutional: No fevers, chills, sweats, or change in appetite.   EDS and insomnia Eyes: No visual changes, double vision, eye pain Ear, nose and throat: No hearing loss, ear pain, nasal congestion, sore throat Cardiovascular: No chest pain, palpitations Respiratory: No shortness of breath at rest or with exertion.   No wheezes.  She has snoring GastrointestinaI: No nausea, vomiting, diarrhea, abdominal pain, fecal incontinence Genitourinary: No dysuria, urinary retention or frequency.  No nocturia. Musculoskeletal: No neck pain, back pain Integumentary: No rash, pruritus, skin lesions Neurological: as above Psychiatric: mild depression at this time.  No anxiety Endocrine: No palpitations, diaphoresis, change in appetite, change in weigh or increased thirst Hematologic/Lymphatic: No anemia, purpura, petechiae. Allergic/Immunologic: No itchy/runny eyes, nasal congestion, recent allergic reactions, rashes  ALLERGIES: No Known Allergies  HOME MEDICATIONS:  Current Outpatient Medications:  .  lamoTRIgine (LAMICTAL) 200 MG tablet, Take 1 tablet (200 mg total) by mouth 2 (two) times daily., Disp: 60 tablet, Rfl:  11 .  lidocaine (LIDODERM) 5 %, Place 2 patches onto the skin daily. Remove & Discard patch within 12 hours or as directed by MD, Disp: 60 patch, Rfl: 5 .  meloxicam (MOBIC) 15 MG tablet, Take 1 tablet (15 mg total) by mouth daily., Disp: 30 tablet, Rfl: 5  PAST MEDICAL HISTORY: Past Medical History:  Diagnosis Date  . Vision abnormalities     PAST SURGICAL HISTORY: Past Surgical History:  Procedure Laterality Date  . CHOLECYSTECTOMY, LAPAROSCOPIC      FAMILY HISTORY: Family History  Problem Relation Age of Onset  . Seizures Mother   . Alcoholism Father     SOCIAL HISTORY:  Social History   Socioeconomic History  . Marital status: Married    Spouse name: Not on file  . Number of children: Not on file  . Years of education: Not on file  . Highest education level: Not on file  Social Needs  . Financial resource strain: Not on file  . Food insecurity - worry: Not on file  . Food insecurity - inability: Not on file  . Transportation needs - medical: Not on file  . Transportation needs - non-medical: Not on file  Occupational History  . Not on file  Tobacco Use  . Smoking status: Former Research scientist (life sciences)  . Smokeless tobacco: Never Used  Substance and Sexual Activity  . Alcohol use: Yes    Alcohol/week: 0.0 oz    Comment: Rare--about twice per yr/fim  . Drug use: No  . Sexual activity: Not on file  Other Topics Concern  . Not on file  Social History Narrative  . Not on file     PHYSICAL EXAM  Vitals:   05/13/17 1429  BP: (!) 142/77  Pulse: 82  Weight: 229 lb (103.9 kg)  Height: 5\' 4"  (1.626 m)    Body mass index is 39.31 kg/m.   General: The patient is well-developed and well-nourished and in no acute distress   Skin: No rashes.  Musculoskeletal:  Back is mildly tender over paraspinal muscles  Neurologic Exam  Mental status: The patient is alert and oriented x 3 at the time of the examination. The patient has apparent normal recent and remote memory,  with an apparently normal attention span and concentration ability.   Speech is normal.  Cranial nerves: Extraocular movements are full.   There is good facial sensation to soft touch bilaterally.Facial strength is normal.  Trapezius and sternocleidomastoid strength is normal. No dysarthria is noted.  The tongue is midline, and the patient has symmetric elevation of the soft palate. No obvious hearing deficits are noted.  Motor:  Muscle bulk is normal.   Tone is normal. Strength is  5 / 5 in all 4 extremities.   Sensory:  She has intact sensation to touch and vibration in the arms. She has mild hyperpathia  and allodynia at the T11 and T12 dermatomes on the right.  Also, mild reduced left L5 sensation to touch  Gait and station: Station is normal.   Gait is normal. The tandem gait is mildly wide... Romberg is negative.   Reflexes: Deep tendon reflexes are symmetric and normal bilaterally.         DIAGNOSTIC DATA (LABS, IMAGING, TESTING) - I reviewed patient records, labs, notes, testing and imaging myself where available.      ASSESSMENT AND PLAN   Facet joint syndrome ([place]) - Plan: Ambulatory referral to Physical Therapy  Postherpetic neuralgia  Midline low back pain without sciatica, unspecified chronicity - Plan: Ambulatory referral to Physical Therapy   1.   She will continue lamotrigine and Lidoderm patches for her postherpetic neuralgia..  2.  Increased meloxicam from 7.5-15 mg daily for her low back pain that is likely due to facet hypertrophy.  3.    Referral for physical therapy. If not better, consider medial branch blocks/RFA.   4.   She will return to see me in 6 months or sooner if there are new or worsening neurologic symptoms.    Richard A. Felecia Shelling, MD, PhD 3/47/4259, 5:63 PM Certified in Neurology, Clinical Neurophysiology, Sleep Medicine, Pain Medicine and Neuroimaging  [facility] [address], [place], [place] [address] [phone]

## 2017-06-06 ENCOUNTER — Telehealth: Payer: Self-pay | Admitting: Neurology

## 2017-06-06 DIAGNOSIS — M8588 Other specified disorders of bone density and structure, other site: Secondary | ICD-10-CM | POA: Diagnosis not present

## 2017-06-06 NOTE — Telephone Encounter (Signed)
Kathleen Schneider/[facility] [phone] pt lost lamoTRIgine (LAMICTAL) 200 MG tablet and meloxicam (MOBIC) 15 MG tablet, she picked these up on 2/17. Pt is there now and waiting for an answer if she can pick these up early. Please call asap

## 2017-06-06 NOTE — Telephone Encounter (Signed)
Spoke with pharmacist and explained ok for pt. to pick Lamcital and Meloxicam up early/fim

## 2017-07-15 ENCOUNTER — Ambulatory Visit: Payer: PPO | Attending: Neurology

## 2017-07-15 ENCOUNTER — Other Ambulatory Visit: Payer: Self-pay

## 2017-07-15 DIAGNOSIS — M545 Low back pain, unspecified: Secondary | ICD-10-CM

## 2017-07-15 DIAGNOSIS — M6281 Muscle weakness (generalized): Secondary | ICD-10-CM | POA: Insufficient documentation

## 2017-07-15 DIAGNOSIS — G8929 Other chronic pain: Secondary | ICD-10-CM | POA: Insufficient documentation

## 2017-07-15 NOTE — Therapy (Signed)
[facility] [address], [place], [place], [address] Phone: [phone]   Fax:  [phone]  Physical Therapy Evaluation  Patient Details  Name: Kathleen Schneider MRN: [id number] Date of Birth: 08-Dec-1940 Referring Provider: Arlice Colt, MD   Encounter Date: 07/15/2017  PT End of Session - 07/15/17 1529    Visit Number  1    Date for PT Re-Evaluation  09/09/17    Authorization Type  medicare    PT Start Time  1446    PT Stop Time  1530    PT Time Calculation (min)  44 min    Activity Tolerance  Patient tolerated treatment well    Behavior During Therapy  [facility] for tasks assessed/performed       Past Medical History:  Diagnosis Date  . Vision abnormalities     Past Surgical History:  Procedure Laterality Date  . CHOLECYSTECTOMY, LAPAROSCOPIC      There were no vitals filed for this visit.   Subjective Assessment - 07/15/17 1456    Subjective  Pt presents to PT with complaints of chronic LBP.  Pt had MRI 9 months ago and this showed facet joint syndrome, stenosis and HNP at L4-5.  Pt reports that she is limited in standing and walking due to pain.      Limitations  Walking;Standing    How long can you stand comfortably?  <30 minutes    How long can you walk comfortably?  5-10 minutes    Diagnostic tests  MRI: facet joint hypertrophy, stenosis    Patient Stated Goals  reduce LBP, stand and walk longer without limitation    Currently in Pain?  Yes    Pain Score  3  4-7/10    Pain Location  Back    Pain Orientation  Left;Right;Lower    Pain Type  Chronic pain    Pain Onset  More than a month ago    Pain Frequency  Constant    Aggravating Factors   standing and walking    Pain Relieving Factors  sitting, ibuprofen         [facility] PT Assessment - 07/15/17 0001      Assessment   Medical Diagnosis  facet joint syndrome and midline low back pain without sciatica    Referring Provider  Arlice Colt, MD     Onset Date/Surgical Date  07/16/15    Next MD Visit  6 months    Prior Therapy  none      Precautions   Precautions  None      Restrictions   Weight Bearing Restrictions  No      Balance Screen   Has the patient fallen in the past 6 months  Yes    How many times?  1 at night walking up the driveway    Has the patient had a decrease in activity level because of a fear of falling?   No    Is the patient reluctant to leave their home because of a fear of falling?   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 [employer]   Overall Cognitive Status  Within Functional Limits for tasks assessed      Observation/Other Assessments   Focus on Therapeutic Outcomes (FOTO)   56% limitation      Posture/Postural Control   Posture/Postural Control  Postural limitations    Postural Limitations  Flexed trunk;Forward head;Rounded Shoulders      ROM / Strength   AROM / PROM / Strength  AROM;PROM;Strength      AROM   Overall AROM   Within functional limits for tasks performed    Overall AROM Comments  lumbar A/ROM is full with pain reported at end range of each      PROM   Overall PROM   Deficits    Overall PROM Comments  hip flexibility is limited by 50% with SLR and 50% IR and 25% ER      Strength   Overall Strength  Deficits    Overall Strength Comments  4+/5 bil knee strength and 4/5 hip strength      Palpation   Spinal mobility  reduced spinal mobility in the lumbar spine without pain.  Tension over bil lumbar paraspinals       Transfers   Comments  max UE assist with be mobility and supine to sit transition      Ambulation/Gait    Ambulation/Gait  Yes    Gait Pattern  Step-through pattern                Objective measurements completed on examination: See above findings.              PT Education - 07/15/17 1527    Education provided  Yes    Education Details  lumbar and hip flexibility    Person(s) Educated  Patient    Methods  Explanation;Handout;Demonstration    Comprehension  Verbalized understanding;Returned demonstration       PT Short Term Goals - 07/15/17 1530      PT SHORT TERM GOAL #1   Title  be independent in initial HEP    Time  4    Period  Weeks    Status  New    Target Date  08/12/17      PT SHORT TERM GOAL #2   Title  verbalized and demonstrate correct body mechanics and postural modifications for lumbar protection with daily tasks    Time  4    Period  Weeks    Status  New    Target Date  08/12/17      PT SHORT TERM GOAL #3   Title  report < or = to 5/10 LBP with standing and walking    Time  4    Period  Weeks    Status  New    Target Date  08/12/17        PT Long Term Goals - 07/15/17 1501      PT LONG TERM GOAL #1   Title  be independent in advanced HEP    Time  8    Period  Weeks    Status  New    Target Date  09/09/17      PT LONG TERM GOAL #2   Title  walk > or = 10 minutes without limitation due to LBP    Time  8    Period  Weeks  Status  New    Target Date  09/09/17      PT LONG TERM GOAL #3   Title  report < or = to 3/10 LBP with standing activities at home    Time  8    Period  Weeks    Status  New    Target Date  09/09/17      PT LONG TERM GOAL #4   Title  reduce FOTO to < or = to 44% limitation    Time  8    Period  Weeks    Status  New    Target Date  09/09/17      PT LONG TERM GOAL #5   Title  perform regular walking daily in her driveway and verbalize understanding of how to progress    Time  8    Period  Weeks    Status  New    Target Date  09/09/17             Plan - 07/15/17 1622    Clinical  Impression Statement  Pt presents to PT with complaints of LBP of a chronic nature.  Recent imaging showed facet joint degeneration and stenosis.  Pt demonstrates flexed trunk posture in standing.  Pt with decreased hamstring flexibility and limited hip IR/ER.  Pt is limited to standing 30 minutes at home and < 10 minutes of walking in the community due to pain.  Pt will benefit from skilled PT for body mechanics education, core strength, hip strength and flexibility to reduce pain and improve tolerance for standing and walking.      History and Personal Factors relevant to plan of care:  DDD    Clinical Presentation  Stable    Clinical Decision Making  Low    Rehab Potential  Good    PT Frequency  2x / week    PT Duration  8 weeks    PT Treatment/Interventions  ADLs/Self Care Home Management;Cryotherapy;Electrical Stimulation;Ultrasound;Functional mobility training;Therapeutic activities;Therapeutic exercise;Patient/family education;Neuromuscular re-education;Manual techniques;Passive range of motion;Taping;Dry needling    PT Next Visit Plan  body mechanics education, review HEP, core strength, hip flexibility    Consulted and Agree with Plan of Care  Patient       Patient will benefit from skilled therapeutic intervention in order to improve the following deficits and impairments:  Impaired flexibility, Decreased activity tolerance, Postural dysfunction, Increased muscle spasms, Improper body mechanics, Pain  Visit Diagnosis: Chronic bilateral low back pain without sciatica - Plan: PT plan of care cert/re-cert  Muscle weakness (generalized) - Plan: PT plan of care cert/re-cert     Problem List Patient Active Problem List   Diagnosis Date Noted  . Facet joint syndrome 05/13/2017  . Spinal stenosis, lumbar 09/25/2016  . Lower back pain 03/27/2016  . Excessive daytime sleepiness 05/24/2015  . Postherpetic neuralgia 02/18/2015  . Dysesthesia 02/18/2015  . Insomnia 02/18/2015  . Snoring  02/18/2015    Sigurd Sos, PT 07/15/17 4:44 PM  [facility] [address], [place], [place], [address] Phone: [phone]   Fax:  [phone]  Name: Kathleen Schneider MRN: [id number] Date of Birth: Dec 17, 1940

## 2017-07-15 NOTE — Patient Instructions (Signed)
Access Code: [email or web address]  URL: [email or web address]  Date: 07/15/2017  Prepared by: Sigurd Sos   Exercises  Supine Lower Trunk Rotation - 3 reps - 1 sets - 20 hold - 3x daily - 7x weekly  Hooklying Single Knee to Chest - 3 reps - 1 sets - 20 hold - 3x daily - 7x weekly  Seated Hamstring Stretch - 3 reps - 1 sets - 20 hold - 3x daily - 7x weekly    [facility] [address], [place], [place] [address] Phone # [phone] Fax [phone]

## 2017-07-18 ENCOUNTER — Ambulatory Visit: Payer: PPO | Admitting: Physical Therapy

## 2017-07-18 ENCOUNTER — Encounter: Payer: Self-pay | Admitting: Physical Therapy

## 2017-07-18 DIAGNOSIS — M6281 Muscle weakness (generalized): Secondary | ICD-10-CM

## 2017-07-18 DIAGNOSIS — G8929 Other chronic pain: Secondary | ICD-10-CM

## 2017-07-18 DIAGNOSIS — M545 Low back pain: Secondary | ICD-10-CM | POA: Diagnosis not present

## 2017-07-18 NOTE — Therapy (Signed)
[facility] [address] [place], [place], [address] Phone: [phone]   Fax:  [phone]  Physical Therapy Treatment  Patient Details  Name: Kathleen Schneider MRN: [id number] Date of Birth: 26-Sep-1940 Referring Provider: Arlice Colt, MD   Encounter Date: 07/18/2017  PT End of Session - 07/18/17 1140    Visit Number  2    Date for PT Re-Evaluation  09/09/17    Authorization Type  medicare    PT Start Time  1101    PT Stop Time  1145    PT Time Calculation (min)  44 min    Activity Tolerance  Patient tolerated treatment well    Behavior During Therapy  [facility] for tasks assessed/performed       Past Medical History:  Diagnosis Date  . Vision abnormalities     Past Surgical History:  Procedure Laterality Date  . CHOLECYSTECTOMY, LAPAROSCOPIC      There were no vitals filed for this visit.  Subjective Assessment - 07/18/17 1104    Subjective  doing housework all morning so back is uncomfortable today.  hasn't done exercises yet today but has been doing them consistently.    Patient Stated Goals  reduce LBP, stand and walk longer without limitation    Currently in Pain?  Yes    Pain Score  8     Pain Location  Back    Pain Orientation  Right;Left;Lower    Pain Descriptors / Indicators  Aching;Dull    Pain Type  Chronic pain    Pain Onset  More than a month ago    Pain Frequency  Constant    Aggravating Factors   housework, standing, walking    Pain Relieving Factors  sitting, ibuprofen                       [facility] Adult PT Treatment/Exercise - 07/18/17 1117      Self-Care   Self-Care  ADL's    ADL's  discussed proper mechanics for housework and how to better protect back      Exercises   Exercises  Lumbar      Lumbar Exercises: Stretches   Passive Hamstring Stretch  Right;Left;3 reps;20 seconds    Single Knee to Chest Stretch  Right;Left;3 reps;20 seconds    Lower Trunk Rotation  3  reps;20 seconds    Lower Trunk Rotation Limitations  bil      Lumbar Exercises: Supine   Pelvic Tilt  10 reps;5 seconds    Clam  10 reps with ab set             PT Education - 07/18/17 1139    Education provided  Yes    Education Details  body mechanics with IADLs    Person(s) Educated  Patient    Methods  Explanation;Demonstration;Handout    Comprehension  Verbalized understanding       PT Short Term Goals - 07/15/17 1530      PT SHORT TERM GOAL #1   Title  be independent in initial HEP    Time  4    Period  Weeks    Status  New    Target Date  08/12/17      PT SHORT TERM GOAL #2   Title  verbalized and demonstrate correct body mechanics and postural modifications for lumbar protection with daily tasks    Time  4    Period  Weeks  Status  New    Target Date  08/12/17      PT SHORT TERM GOAL #3   Title  report < or = to 5/10 LBP with standing and walking    Time  4    Period  Weeks    Status  New    Target Date  08/12/17        PT Long Term Goals - 07/15/17 1501      PT LONG TERM GOAL #1   Title  be independent in advanced HEP    Time  8    Period  Weeks    Status  New    Target Date  09/09/17      PT LONG TERM GOAL #2   Title  walk > or = 10 minutes without limitation due to LBP    Time  8    Period  Weeks    Status  New    Target Date  09/09/17      PT LONG TERM GOAL #3   Title  report < or = to 3/10 LBP with standing activities at home    Time  8    Period  Weeks    Status  New    Target Date  09/09/17      PT LONG TERM GOAL #4   Title  reduce FOTO to < or = to 44% limitation    Time  8    Period  Weeks    Status  New    Target Date  09/09/17      PT LONG TERM GOAL #5   Title  perform regular walking daily in her driveway and verbalize understanding of how to progress    Time  8    Period  Weeks    Status  New    Target Date  09/09/17            Plan - 07/18/17 1140    Clinical Impression Statement  Pt tolerated  session well today with reports of decreased pain.  Began core stabilization exercises and plan to give at next session.  Will continue to benefit from PT to maximize function and decrease pain.    Rehab Potential  Good    PT Frequency  2x / week    PT Duration  8 weeks    PT Treatment/Interventions  ADLs/Self Care Home Management;Cryotherapy;Electrical Stimulation;Ultrasound;Functional mobility training;Therapeutic activities;Therapeutic exercise;Patient/family education;Neuromuscular re-education;Manual techniques;Passive range of motion;Taping;Dry needling    PT Next Visit Plan  body mechanics education, review HEP, core strength, hip flexibility    Consulted and Agree with Plan of Care  Patient       Patient will benefit from skilled therapeutic intervention in order to improve the following deficits and impairments:  Impaired flexibility, Decreased activity tolerance, Postural dysfunction, Increased muscle spasms, Improper body mechanics, Pain  Visit Diagnosis: Chronic bilateral low back pain without sciatica  Muscle weakness (generalized)     Problem List Patient Active Problem List   Diagnosis Date Noted  . Facet joint syndrome 05/13/2017  . Spinal stenosis, lumbar 09/25/2016  . Lower back pain 03/27/2016  . Excessive daytime sleepiness 05/24/2015  . Postherpetic neuralgia 02/18/2015  . Dysesthesia 02/18/2015  . Insomnia 02/18/2015  . Snoring 02/18/2015      Laureen Abrahams, PT, DPT 07/18/17 11:46 AM    [facility] [address], [place], [place], [address] Phone: [phone]   Fax:  [phone]  Name:  Kathleen Schneider MRN: [id number] Date of Birth: [birth year]

## 2017-07-18 NOTE — Patient Instructions (Addendum)
Sleeping on Back  Place pillow under knees. A pillow with cervical support and a roll around waist are also helpful. Copyright  VHI. All rights reserved.  Sleeping on Side Place pillow between knees. Use cervical support under neck and a roll around waist as needed. Copyright  VHI. All rights reserved.   Sleeping on Stomach   If this is the only desirable sleeping position, place pillow under lower legs, and under stomach or chest as needed.  Posture - Sitting   Sit upright, head facing forward. Try using a roll to support lower back. Keep shoulders relaxed, and avoid rounded back. Keep hips level with knees. Avoid crossing legs for long periods. Stand to Sit / Sit to Stand   To sit: Bend knees to lower self onto front edge of chair, then scoot back on seat. To stand: Reverse sequence by placing one foot forward, and scoot to front of seat. Use rocking motion to stand up.   Work Height and Reach  Ideal work height is no more than 2 to 4 inches below elbow level when standing, and at elbow level when sitting. Reaching should be limited to arm's length, with elbows slightly bent.  Bending  Bend at hips and knees, not back. Keep feet shoulder-width apart.    Posture - Standing   Good posture is important. Avoid slouching and forward head thrust. Maintain curve in low back and align ears over shoul- ders, hips over ankles.  Alternating Positions   Alternate tasks and change positions frequently to reduce fatigue and muscle tension. Take rest breaks. Computer Work   Position work to face forward. Use proper work and seat height. Keep shoulders back and down, wrists straight, and elbows at right angles. Use chair that provides full back support. Add footrest and lumbar roll as needed.  Getting Into / Out of Car  Lower self onto seat, scoot back, then bring in one leg at a time. Reverse sequence to get out.  Dressing  Lie on back to pull socks or slacks over feet, or sit  and bend leg while keeping back straight.    Housework - Sink  Place one foot on ledge of cabinet under sink when standing at sink for prolonged periods.   Pushing / Pulling  Pushing is preferable to pulling. Keep back in proper alignment, and use leg muscles to do the work.  Deep Squat   Squat and lift with both arms held against upper trunk. Tighten stomach muscles without holding breath. Use smooth movements to avoid jerking.  Avoid Twisting   Avoid twisting or bending back. Pivot around using foot movements, and bend at knees if needed when reaching for articles.  Carrying Luggage   Distribute weight evenly on both sides. Use a cart whenever possible. Do not twist trunk. Move body as a unit.   Lifting Principles .Maintain proper posture and head alignment. .Slide object as close as possible before lifting. .Move obstacles out of the way. .Test before lifting; ask for help if too heavy. .Tighten stomach muscles without holding breath. .Use smooth movements; do not jerk. .Use legs to do the work, and pivot with feet. .Distribute the work load symmetrically and close to the center of trunk. .Push instead of pull whenever possible.   Ask For Help   Ask for help and delegate to others when possible. Coordinate your movements when lifting together, and maintain the low back curve.  Log Roll   Lying on back, bend left knee and place left   arm across chest. Roll all in one movement to the right. Reverse to roll to the left. Always move as one unit. Housework - Sweeping  Use long-handled equipment to avoid stooping.   Housework - Wiping  Position yourself as close as possible to reach work surface. Avoid straining your back.  Laundry - Unloading Wash   To unload small items at bottom of washer, lift leg opposite to arm being used to reach.  [place] close to area to be raked. Use arm movements to do the work. Keep back straight and avoid  twisting.     Cart  When reaching into cart with one arm, lift opposite leg to keep back straight.   Getting Into / Out of Bed  Lower self to lie down on one side by raising legs and lowering head at the same time. Use arms to assist moving without twisting. Bend both knees to roll onto back if desired. To sit up, start from lying on side, and use same move-ments in reverse. Housework - Vacuuming  Hold the vacuum with arm held at side. Step back and forth to move it, keeping head up. Avoid twisting.   Laundry - IT consultant so that bending and twisting can be avoided.   Laundry - Unloading Dryer  Squat down to reach into clothes dryer or use a reacher.  Gardening - Weeding / Probation officer or Kneel. Knee pads may be helpful.                     Access Code: [email or web address]  URL: [email or web address]  Date: 07/18/2017  Prepared by: Faustino Congress   Exercises  Supine Lower Trunk Rotation - 3 reps - 1 sets - 20 hold - 1x daily - 7x weekly  Hooklying Single Knee to Chest - 3 reps - 1 sets - 20 hold - 3x daily - 7x weekly  Seated Hamstring Stretch - 3 reps - 1 sets - 20 hold - 3x daily - 7x weekly  Patient Education  Household Activities

## 2017-07-23 ENCOUNTER — Ambulatory Visit: Payer: PPO | Admitting: Physical Therapy

## 2017-07-23 ENCOUNTER — Encounter: Payer: Self-pay | Admitting: Physical Therapy

## 2017-07-23 DIAGNOSIS — M545 Low back pain, unspecified: Secondary | ICD-10-CM

## 2017-07-23 DIAGNOSIS — G8929 Other chronic pain: Secondary | ICD-10-CM

## 2017-07-23 DIAGNOSIS — M6281 Muscle weakness (generalized): Secondary | ICD-10-CM

## 2017-07-23 NOTE — Therapy (Signed)
[facility] [address], [place], [place], [address] Phone: [phone]   Fax:  [phone]  Physical Therapy Treatment  Patient Details  Name: Kathleen Schneider MRN: [id number] Date of Birth: 1940-07-26 Referring Provider: Arlice Colt, MD   Encounter Date: 07/23/2017  PT End of Session - 07/23/17 1145    Visit Number  3    Date for PT Re-Evaluation  09/09/17    Authorization Type  medicare    PT Start Time  1110 pt arrived late    PT Stop Time  1144    PT Time Calculation (min)  34 min    Activity Tolerance  Patient tolerated treatment well    Behavior During Therapy  [facility] for tasks assessed/performed       Past Medical History:  Diagnosis Date  . Vision abnormalities     Past Surgical History:  Procedure Laterality Date  . CHOLECYSTECTOMY, LAPAROSCOPIC      There were no vitals filed for this visit.  Subjective Assessment - 07/23/17 1114    Subjective  arrived late to session, but doing exercises.  back was hurting yesterday "by the time I got home, I was in and out of the car all day."    Patient Stated Goals  reduce LBP, stand and walk longer without limitation    Currently in Pain?  Yes    Pain Score  4     Pain Location  Back    Pain Orientation  Right;Left;Lower    Pain Descriptors / Indicators  Aching;Dull    Pain Type  Chronic pain    Pain Onset  More than a month ago    Pain Frequency  Constant    Aggravating Factors   housework, standing, walking    Pain Relieving Factors  sitting, ibuprofen                       [facility] Adult PT Treatment/Exercise - 07/23/17 1116      Lumbar Exercises: Stretches   Passive Hamstring Stretch  Right;Left;3 reps;20 seconds    Single Knee to Chest Stretch  Right;Left;3 reps;20 seconds    Lower Trunk Rotation  3 reps;20 seconds    Lower Trunk Rotation Limitations  bil      Lumbar Exercises: Aerobic   Nustep  L3 x 5 min      Lumbar  Exercises: Supine   Pelvic Tilt  10 reps;5 seconds    Clam  10 reps with ab set    Heel Slides  10 reps with ab set    Bent Knee Raise  10 reps with ab set    Bridge  10 reps;5 seconds      Lumbar Exercises: Sidelying   Clam  Both;10 reps green theraband             PT Education - 07/23/17 1144    Education provided  Yes    Education Details  core exercises    Person(s) Educated  Patient    Methods  Explanation;Demonstration;Handout    Comprehension  Verbalized understanding;Returned demonstration;Need further instruction       PT Short Term Goals - 07/15/17 1530      PT SHORT TERM GOAL #1   Title  be independent in initial HEP    Time  4    Period  Weeks    Status  New    Target Date  08/12/17      PT SHORT TERM  GOAL #2   Title  verbalized and demonstrate correct body mechanics and postural modifications for lumbar protection with daily tasks    Time  4    Period  Weeks    Status  New    Target Date  08/12/17      PT SHORT TERM GOAL #3   Title  report < or = to 5/10 LBP with standing and walking    Time  4    Period  Weeks    Status  New    Target Date  08/12/17        PT Long Term Goals - 07/15/17 1501      PT LONG TERM GOAL #1   Title  be independent in advanced HEP    Time  8    Period  Weeks    Status  New    Target Date  09/09/17      PT LONG TERM GOAL #2   Title  walk > or = 10 minutes without limitation due to LBP    Time  8    Period  Weeks    Status  New    Target Date  09/09/17      PT LONG TERM GOAL #3   Title  report < or = to 3/10 LBP with standing activities at home    Time  8    Period  Weeks    Status  New    Target Date  09/09/17      PT LONG TERM GOAL #4   Title  reduce FOTO to < or = to 44% limitation    Time  8    Period  Weeks    Status  New    Target Date  09/09/17      PT LONG TERM GOAL #5   Title  perform regular walking daily in her driveway and verbalize understanding of how to progress    Time  8     Period  Weeks    Status  New    Target Date  09/09/17            Plan - 07/23/17 1145    Clinical Impression Statement  Pt arrived late to session but able to add core stability exercises to HEP today needing min cues for technique.  Will continue to benefit from PT to maximize function.    Rehab Potential  Good    PT Frequency  2x / week    PT Duration  8 weeks    PT Treatment/Interventions  ADLs/Self Care Home Management;Cryotherapy;Electrical Stimulation;Ultrasound;Functional mobility training;Therapeutic activities;Therapeutic exercise;Patient/family education;Neuromuscular re-education;Manual techniques;Passive range of motion;Taping;Dry needling    PT Next Visit Plan  review core HEP, continue with hip/core stability exercises    Consulted and Agree with Plan of Care  Patient       Patient will benefit from skilled therapeutic intervention in order to improve the following deficits and impairments:  Impaired flexibility, Decreased activity tolerance, Postural dysfunction, Increased muscle spasms, Improper body mechanics, Pain  Visit Diagnosis: Chronic bilateral low back pain without sciatica  Muscle weakness (generalized)     Problem List Patient Active Problem List   Diagnosis Date Noted  . Facet joint syndrome 05/13/2017  . Spinal stenosis, lumbar 09/25/2016  . Lower back pain 03/27/2016  . Excessive daytime sleepiness 05/24/2015  . Postherpetic neuralgia 02/18/2015  . Dysesthesia 02/18/2015  . Insomnia 02/18/2015  . Snoring 02/18/2015      Laureen Abrahams, PT, DPT  07/23/17 11:47 AM    [facility] [address], [place], [place], [address] Phone: [phone]   Fax:  [phone]  Name: KERIANN RANKIN MRN: [id number] Date of Birth: February 29, 1941

## 2017-07-23 NOTE — Patient Instructions (Signed)
Access Code: [email or web address]  URL: [email or web address]  Date: 07/23/2017  Prepared by: Faustino Congress   Exercises  Supine Lower Trunk Rotation - 3 reps - 1 sets - 20 hold - 1x daily - 7x weekly  Hooklying Single Knee to Chest - 3 reps - 1 sets - 20 hold - 3x daily - 7x weekly  Seated Hamstring Stretch - 3 reps - 1 sets - 20 hold - 3x daily - 7x weekly  Supine Posterior Pelvic Tilt - 10 reps - 1-2 sets - 5 sec hold - 2x daily - 7x weekly  Bent Knee Fallouts - 10 reps - 1-2 sets - 2x daily - 7x weekly  Supine March - 10 reps - 1-2 sets - 2x daily - 7x weekly  Supine Heel Slides - 10 reps - 1-2 sets - 2x daily - 7x weekly  Patient Education  Household Activities

## 2017-07-25 ENCOUNTER — Encounter: Payer: Self-pay | Admitting: Physical Therapy

## 2017-07-25 ENCOUNTER — Ambulatory Visit: Payer: PPO | Admitting: Physical Therapy

## 2017-07-25 DIAGNOSIS — M545 Low back pain: Secondary | ICD-10-CM | POA: Diagnosis not present

## 2017-07-25 DIAGNOSIS — M6281 Muscle weakness (generalized): Secondary | ICD-10-CM

## 2017-07-25 DIAGNOSIS — G8929 Other chronic pain: Secondary | ICD-10-CM

## 2017-07-25 NOTE — Therapy (Signed)
[facility] [address], [place], [place], [address] Phone: [phone]   Fax:  [phone]  Physical Therapy Treatment  Patient Details  Name: Kathleen Schneider MRN: [id number] Date of Birth: 1940/08/15 Referring Provider: Arlice Colt, MD   Encounter Date: 07/25/2017  PT End of Session - 07/25/17 1142    Visit Number  4    Date for PT Re-Evaluation  09/09/17    Authorization Type  medicare    PT Start Time  1100    PT Stop Time  1141    PT Time Calculation (min)  41 min    Activity Tolerance  Patient tolerated treatment well    Behavior During Therapy  [facility] for tasks assessed/performed       Past Medical History:  Diagnosis Date  . Vision abnormalities     Past Surgical History:  Procedure Laterality Date  . CHOLECYSTECTOMY, LAPAROSCOPIC      There were no vitals filed for this visit.  Subjective Assessment - 07/25/17 1103    Subjective  back is doing okay; did exercises this morning.  feels pain is less intense overall    Patient Stated Goals  reduce LBP, stand and walk longer without limitation    Currently in Pain?  No/denies                       [facility] Adult PT Treatment/Exercise - 07/25/17 1106      Lumbar Exercises: Aerobic   Nustep  L5 x 5 min      Lumbar Exercises: Supine   Bridge  10 reps;5 seconds    Other Supine Lumbar Exercises  lumbar decompression series x 10 reps each               PT Short Term Goals - 07/15/17 1530      PT SHORT TERM GOAL #1   Title  be independent in initial HEP    Time  4    Period  Weeks    Status  New    Target Date  08/12/17      PT SHORT TERM GOAL #2   Title  verbalized and demonstrate correct body mechanics and postural modifications for lumbar protection with daily tasks    Time  4    Period  Weeks    Status  New    Target Date  08/12/17      PT SHORT TERM GOAL #3   Title  report < or = to 5/10 LBP with standing and  walking    Time  4    Period  Weeks    Status  New    Target Date  08/12/17        PT Long Term Goals - 07/15/17 1501      PT LONG TERM GOAL #1   Title  be independent in advanced HEP    Time  8    Period  Weeks    Status  New    Target Date  09/09/17      PT LONG TERM GOAL #2   Title  walk > or = 10 minutes without limitation due to LBP    Time  8    Period  Weeks    Status  New    Target Date  09/09/17      PT LONG TERM GOAL #3   Title  report < or = to 3/10 LBP with standing activities at home  Time  8    Period  Weeks    Status  New    Target Date  09/09/17      PT LONG TERM GOAL #4   Title  reduce FOTO to < or = to 44% limitation    Time  8    Period  Weeks    Status  New    Target Date  09/09/17      PT LONG TERM GOAL #5   Title  perform regular walking daily in her driveway and verbalize understanding of how to progress    Time  8    Period  Weeks    Status  New    Target Date  09/09/17            Plan - 07/25/17 1142    Clinical Impression Statement  Pt tolerated session well today without increase in pain, needing min cues for exercises today.  Progressing well with PT reporting decreased intensity of pain.    Rehab Potential  Good    PT Frequency  2x / week    PT Duration  8 weeks    PT Treatment/Interventions  ADLs/Self Care Home Management;Cryotherapy;Electrical Stimulation;Ultrasound;Functional mobility training;Therapeutic activities;Therapeutic exercise;Patient/family education;Neuromuscular re-education;Manual techniques;Passive range of motion;Taping;Dry needling    PT Next Visit Plan  review core HEP, continue with hip/core stability exercises    Consulted and Agree with Plan of Care  Patient       Patient will benefit from skilled therapeutic intervention in order to improve the following deficits and impairments:  Impaired flexibility, Decreased activity tolerance, Postural dysfunction, Increased muscle spasms, Improper body  mechanics, Pain  Visit Diagnosis: Chronic bilateral low back pain without sciatica  Muscle weakness (generalized)     Problem List Patient Active Problem List   Diagnosis Date Noted  . Facet joint syndrome 05/13/2017  . Spinal stenosis, lumbar 09/25/2016  . Lower back pain 03/27/2016  . Excessive daytime sleepiness 05/24/2015  . Postherpetic neuralgia 02/18/2015  . Dysesthesia 02/18/2015  . Insomnia 02/18/2015  . Snoring 02/18/2015      Laureen Abrahams, PT, DPT 07/25/17 11:43 AM     [facility] [address], [place], [place], [address] Phone: [phone]   Fax:  [phone]  Name: Kathleen Schneider MRN: [id number] Date of Birth: 05/21/1940

## 2017-07-29 ENCOUNTER — Ambulatory Visit: Payer: PPO

## 2017-07-29 DIAGNOSIS — M545 Low back pain: Secondary | ICD-10-CM | POA: Diagnosis not present

## 2017-07-29 DIAGNOSIS — M6281 Muscle weakness (generalized): Secondary | ICD-10-CM

## 2017-07-29 DIAGNOSIS — G8929 Other chronic pain: Secondary | ICD-10-CM

## 2017-07-29 NOTE — Therapy (Addendum)
[facility] [address] [place], [place], [address] Phone: [phone]   Fax:  [phone]  Physical Therapy Treatment  Patient Details  Name: Kathleen Schneider MRN: [id number] Date of Birth: 1940-06-29 Referring Provider: Arlice Colt, MD   Encounter Date: 07/29/2017  PT End of Session - 07/29/17 1258    Visit Number  5    Date for PT Re-Evaluation  09/09/17    Authorization Type  medicare    PT Start Time  1228    PT Stop Time  1306    PT Time Calculation (min)  38 min    Activity Tolerance  Patient tolerated treatment well    Behavior During Therapy  [facility] for tasks assessed/performed       Past Medical History:  Diagnosis Date  . Vision abnormalities     Past Surgical History:  Procedure Laterality Date  . CHOLECYSTECTOMY, LAPAROSCOPIC      There were no vitals filed for this visit.  Subjective Assessment - 07/29/17 1226    Subjective  I was proud of myself yesterday.  I was able to walk a lot in [place] and it didn't hurt as bad as it usually does.      Currently in Pain?  Yes    Pain Score  6     Pain Location  Back    Pain Orientation  Right;Left;Lower    Pain Descriptors / Indicators  Aching;Dull    Pain Type  Chronic pain    Pain Onset  More than a month ago    Pain Frequency  Constant    Aggravating Factors   walking, standing, housework    Pain Relieving Factors  sitting, ibuprofen                       [facility] Adult PT Treatment/Exercise - 07/29/17 0001      Lumbar Exercises: Stretches   Active Hamstring Stretch  3 reps;20 seconds    Lower Trunk Rotation  3 reps;20 seconds      Lumbar Exercises: Aerobic   Nustep  L2 x 9 min (2 laps) PT present to discuss progress      Lumbar Exercises: Supine   Ab Set  -- tactile cues and verbal cueing for technique    Pelvic Tilt  10 reps;5 seconds    Clam  10 reps with ab set    Heel Slides  10 reps with ab set    Bent Knee Raise   10 reps with ab set    Bridge  10 reps;5 seconds      Lumbar Exercises: Sidelying   Clam  Both;10 reps green theraband               PT Short Term Goals - 07/29/17 1232      PT SHORT TERM GOAL #1   Title  be independent in initial HEP    Status  Achieved      PT SHORT TERM GOAL #2   Title  verbalized and demonstrate correct body mechanics and postural modifications for lumbar protection with daily tasks    Status  Achieved      PT SHORT TERM GOAL #3   Title  report < or = to 5/10 LBP with standing and walking    Baseline  up to 7/10    Time  4    Period  Weeks    Status  On-going  PT Long Term Goals - 07/15/17 1501      PT LONG TERM GOAL #1   Title  be independent in advanced HEP    Time  8    Period  Weeks    Status  New    Target Date  09/09/17      PT LONG TERM GOAL #2   Title  walk > or = 10 minutes without limitation due to LBP    Time  8    Period  Weeks    Status  New    Target Date  09/09/17      PT LONG TERM GOAL #3   Title  report < or = to 3/10 LBP with standing activities at home    Time  8    Period  Weeks    Status  New    Target Date  09/09/17      PT LONG TERM GOAL #4   Title  reduce FOTO to < or = to 44% limitation    Time  8    Period  Weeks    Status  New    Target Date  09/09/17      PT LONG TERM GOAL #5   Title  perform regular walking daily in her driveway and verbalize understanding of how to progress    Time  8    Period  Weeks    Status  New    Target Date  09/09/17            Plan - 07/29/17 1238    Clinical Impression Statement  Pt is making body mechanics modifications at home and PT discussed some modifications for brushing teeth to reduce lumbar flexion.  Pt is independent and compliant with HEP for core strength and hip flexibility.  Pt is able to walk longer in the community with less LBP.  Pt requires tactile cues and supervision to ensure correct technique with exercise in the clinic today.  Pt  will continue to benefit from skilled PT for core strength, endurance, flexibiity and pain management as needed.      PT Frequency  2x / week    PT Duration  8 weeks    PT Treatment/Interventions  ADLs/Self Care Home Management;Cryotherapy;Electrical Stimulation;Ultrasound;Functional mobility training;Therapeutic activities;Therapeutic exercise;Patient/family education;Neuromuscular re-education;Manual techniques;Passive range of motion;Taping;Dry needling    PT Next Visit Plan  hip stability, continue core strength, pain management as needed.      Recommended Other Services  initial certification is signed    Consulted and Agree with Plan of Care  Patient       Patient will benefit from skilled therapeutic intervention in order to improve the following deficits and impairments:  Impaired flexibility, Decreased activity tolerance, Postural dysfunction, Increased muscle spasms, Improper body mechanics, Pain  Visit Diagnosis: Chronic bilateral low back pain without sciatica  Muscle weakness (generalized)     Problem List Patient Active Problem List   Diagnosis Date Noted  . Facet joint syndrome 05/13/2017  . Spinal stenosis, lumbar 09/25/2016  . Lower back pain 03/27/2016  . Excessive daytime sleepiness 05/24/2015  . Postherpetic neuralgia 02/18/2015  . Dysesthesia 02/18/2015  . Insomnia 02/18/2015  . Snoring 02/18/2015    Sigurd Sos, PT 07/29/17 1:13 PM  [facility] [address], [place], [place], [address] Phone: [phone]   Fax:  [phone]  Name: JOVITA PERSING MRN: [id number] Date of Birth: 31-Jan-1941

## 2017-08-01 ENCOUNTER — Ambulatory Visit: Payer: PPO

## 2017-08-01 DIAGNOSIS — M545 Low back pain: Principal | ICD-10-CM

## 2017-08-01 DIAGNOSIS — G8929 Other chronic pain: Secondary | ICD-10-CM

## 2017-08-01 DIAGNOSIS — M6281 Muscle weakness (generalized): Secondary | ICD-10-CM

## 2017-08-01 NOTE — Therapy (Signed)
[facility] [address], [place], [place], [address] Phone: [phone]   Fax:  [phone]  Physical Therapy Treatment  Patient Details  Name: Kathleen Schneider MRN: [id number] Date of Birth: [birth year] Referring Provider: Arlice Colt, MD   Encounter Date: 08/01/2017  PT End of Session - 08/01/17 1020    Visit Number  6    Date for PT Re-Evaluation  09/09/17    Authorization Type  medicare    PT Start Time  0930    PT Stop Time  1013    PT Time Calculation (min)  43 min    Activity Tolerance  Patient tolerated treatment well    Behavior During Therapy  [facility] for tasks assessed/performed       Past Medical History:  Diagnosis Date  . Vision abnormalities     Past Surgical History:  Procedure Laterality Date  . CHOLECYSTECTOMY, LAPAROSCOPIC      There were no vitals filed for this visit.  Subjective Assessment - 08/01/17 0941    Subjective  I am proud of myself. I ran many errands the other day and I was fine when I got home.    Currently in Pain?  Yes    Pain Score  1     Pain Location  Back    Pain Orientation  Left    Pain Descriptors / Indicators  Aching;Dull    Pain Onset  More than a month ago    Pain Frequency  Constant                       [facility] Adult PT Treatment/Exercise - 08/01/17 0001      Lumbar Exercises: Stretches   Active Hamstring Stretch  3 reps;20 seconds    Lower Trunk Rotation  3 reps;20 seconds      Lumbar Exercises: Aerobic   Nustep  L2 x 8 min, 20 seconds  (2 laps) PT present to discuss progress      Lumbar Exercises: Standing   Row  Strengthening;Both;20 reps;Theraband    Theraband Level (Row)  Level 2 (Red)    Shoulder Extension  Strengthening;Both;Theraband    Theraband Level (Shoulder Extension)  Level 2 (Red)      Lumbar Exercises: Seated   Other Seated Lumbar Exercises  diagonals with red ball: 2x10 bil each      Lumbar Exercises: Supine    Pelvic Tilt  10 reps;5 seconds    Bridge  10 reps;5 seconds      Lumbar Exercises: Sidelying   Clam  Both;10 reps green theraband             PT Education - 08/01/17 1006    Education provided  Yes    Education Details  [email or web address] access code    Methods  Explanation;Demonstration;Handout    Comprehension  Verbalized understanding;Returned demonstration       PT Short Term Goals - 07/29/17 1232      PT SHORT TERM GOAL #1   Title  be independent in initial HEP    Status  Achieved      PT SHORT TERM GOAL #2   Title  verbalized and demonstrate correct body mechanics and postural modifications for lumbar protection with daily tasks    Status  Achieved      PT SHORT TERM GOAL #3   Title  report < or = to 5/10 LBP with standing and walking    Baseline  up to 7/10  Time  4    Period  Weeks    Status  On-going        PT Long Term Goals - 07/15/17 1501      PT LONG TERM GOAL #1   Title  be independent in advanced HEP    Time  8    Period  Weeks    Status  New    Target Date  09/09/17      PT LONG TERM GOAL #2   Title  walk > or = 10 minutes without limitation due to LBP    Time  8    Period  Weeks    Status  New    Target Date  09/09/17      PT LONG TERM GOAL #3   Title  report < or = to 3/10 LBP with standing activities at home    Time  8    Period  Weeks    Status  New    Target Date  09/09/17      PT LONG TERM GOAL #4   Title  reduce FOTO to < or = to 44% limitation    Time  8    Period  Weeks    Status  New    Target Date  09/09/17      PT LONG TERM GOAL #5   Title  perform regular walking daily in her driveway and verbalize understanding of how to progress    Time  8    Period  Weeks    Status  New    Target Date  09/09/17            Plan - 08/01/17 0949    Clinical Impression Statement  Pt reports that she is able to do more in the community without increased pain or limitation.  Pt is making postural corrections and modifications  with home tasks.  Pt was able to tolerate functional core strength exercises and required verbal cues for technique and to control speed.  Pt will cointinue to benefit from skilled Pt for core strength, flexibility and pain management as needed.      Rehab Potential  Good    PT Frequency  2x / week    PT Duration  8 weeks    PT Treatment/Interventions  ADLs/Self Care Home Management;Cryotherapy;Electrical Stimulation;Ultrasound;Functional mobility training;Therapeutic activities;Therapeutic exercise;Patient/family education;Neuromuscular re-education;Manual techniques;Passive range of motion;Taping;Dry needling    PT Next Visit Plan  hip stability, continue core strength, pain management as needed.      PT Home Exercise Plan  access code: [email or web address]     Consulted and Agree with Plan of Care  Patient       Patient will benefit from skilled therapeutic intervention in order to improve the following deficits and impairments:  Impaired flexibility, Decreased activity tolerance, Postural dysfunction, Increased muscle spasms, Improper body mechanics, Pain  Visit Diagnosis: Chronic bilateral low back pain without sciatica  Muscle weakness (generalized)     Problem List Patient Active Problem List   Diagnosis Date Noted  . Facet joint syndrome 05/13/2017  . Spinal stenosis, lumbar 09/25/2016  . Lower back pain 03/27/2016  . Excessive daytime sleepiness 05/24/2015  . Postherpetic neuralgia 02/18/2015  . Dysesthesia 02/18/2015  . Insomnia 02/18/2015  . Snoring 02/18/2015     Sigurd Sos, PT 08/01/17 10:21 AM  [facility] [address] [place], [place], [address] Phone: [phone]   Fax:  [phone]  Name: RABECCA BIRGE  MRN: [id number] Date of Birth: 1940/08/05

## 2017-08-01 NOTE — Patient Instructions (Signed)
Standing shoulder extension and rows with red band  Seated chops: weighted   2x10 each, 2x/day.  [facility] [address], [place], [place] [address] Phone # [phone] Fax [phone]

## 2017-08-05 ENCOUNTER — Ambulatory Visit: Payer: PPO | Admitting: Physical Therapy

## 2017-08-05 ENCOUNTER — Encounter: Payer: Self-pay | Admitting: Physical Therapy

## 2017-08-05 DIAGNOSIS — M6281 Muscle weakness (generalized): Secondary | ICD-10-CM

## 2017-08-05 DIAGNOSIS — M545 Low back pain, unspecified: Secondary | ICD-10-CM

## 2017-08-05 DIAGNOSIS — G8929 Other chronic pain: Secondary | ICD-10-CM

## 2017-08-05 NOTE — Therapy (Signed)
[facility] [address] [place], [place], [address] Phone: [phone]   Fax:  [phone]  Physical Therapy Treatment  Patient Details  Name: Kathleen Schneider MRN: [id number] Date of Birth: 06-25-1940 Referring Provider: Arlice Colt, MD   Encounter Date: 08/05/2017  PT End of Session - 08/05/17 0959    Visit Number  7    Date for PT Re-Evaluation  09/09/17    Authorization Type  medicare    PT Start Time  0927    PT Stop Time  1007    PT Time Calculation (min)  40 min    Activity Tolerance  Patient tolerated treatment well    Behavior During Therapy  [facility] for tasks assessed/performed       Past Medical History:  Diagnosis Date  . Vision abnormalities     Past Surgical History:  Procedure Laterality Date  . CHOLECYSTECTOMY, LAPAROSCOPIC      There were no vitals filed for this visit.  Subjective Assessment - 08/05/17 0930    Subjective  spent most of yesterday in church [employer] holding babies, then to a friends house and able to get through the day with min pain.  doing well today. pain 3/10 during day yesterday    Limitations  Walking;Standing    How long can you stand comfortably?  hasn't done 15 min since back started hurting    How long can you walk comfortably?  "probably longer than just standing"     Patient Stated Goals  reduce LBP, stand and walk longer without limitation    Currently in Pain?  No/denies                       [facility] Adult PT Treatment/Exercise - 08/05/17 0934      Lumbar Exercises: Stretches   Single Knee to Chest Stretch  Right;Left;3 reps;20 seconds    Lower Trunk Rotation  3 reps;20 seconds    Lower Trunk Rotation Limitations  bil      Lumbar Exercises: Aerobic   Nustep  L5 x 6 min      Lumbar Exercises: Supine   Pelvic Tilt  10 reps;5 seconds    Bridge  10 reps;5 seconds 2nd set with strap for isometric hip abdct    Straight Leg Raise  10 reps 2#  with ab set    Isometric Hip Flexion  10 reps;5 seconds single limb; alternating               PT Short Term Goals - 08/05/17 1008      PT SHORT TERM GOAL #1   Title  be independent in initial HEP    Status  Achieved      PT SHORT TERM GOAL #2   Title  verbalized and demonstrate correct body mechanics and postural modifications for lumbar protection with daily tasks    Status  Achieved      PT SHORT TERM GOAL #3   Title  report < or = to 5/10 LBP with standing and walking    Baseline  up to 7/10    Time  4    Period  Weeks    Status  Achieved        PT Long Term Goals - 07/15/17 1501      PT LONG TERM GOAL #1   Title  be independent in advanced HEP    Time  8    Period  Weeks  Status  New    Target Date  09/09/17      PT LONG TERM GOAL #2   Title  walk > or = 10 minutes without limitation due to LBP    Time  8    Period  Weeks    Status  New    Target Date  09/09/17      PT LONG TERM GOAL #3   Title  report < or = to 3/10 LBP with standing activities at home    Time  8    Period  Weeks    Status  New    Target Date  09/09/17      PT LONG TERM GOAL #4   Title  reduce FOTO to < or = to 44% limitation    Time  8    Period  Weeks    Status  New    Target Date  09/09/17      PT LONG TERM GOAL #5   Title  perform regular walking daily in her driveway and verbalize understanding of how to progress    Time  8    Period  Weeks    Status  New    Target Date  09/09/17            Plan - 08/05/17 1000    Clinical Impression Statement  Pt reports pain levels decreased during the day and STG #3 met.  Progressing well with PT increasing activity with decreased pain.    Rehab Potential  Good    PT Frequency  2x / week    PT Duration  8 weeks    PT Treatment/Interventions  ADLs/Self Care Home Management;Cryotherapy;Electrical Stimulation;Ultrasound;Functional mobility training;Therapeutic activities;Therapeutic exercise;Patient/family  education;Neuromuscular re-education;Manual techniques;Passive range of motion;Taping;Dry needling    PT Next Visit Plan  hip stability, continue core strength, pain management as needed.      PT Home Exercise Plan  access code: [email or web address]     Consulted and Agree with Plan of Care  Patient       Patient will benefit from skilled therapeutic intervention in order to improve the following deficits and impairments:  Impaired flexibility, Decreased activity tolerance, Postural dysfunction, Increased muscle spasms, Improper body mechanics, Pain  Visit Diagnosis: Chronic bilateral low back pain without sciatica  Muscle weakness (generalized)     Problem List Patient Active Problem List   Diagnosis Date Noted  . Facet joint syndrome 05/13/2017  . Spinal stenosis, lumbar 09/25/2016  . Lower back pain 03/27/2016  . Excessive daytime sleepiness 05/24/2015  . Postherpetic neuralgia 02/18/2015  . Dysesthesia 02/18/2015  . Insomnia 02/18/2015  . Snoring 02/18/2015      Laureen Abrahams, PT, DPT 08/05/17 10:09 AM    [facility] [address] [place], [place], [address] Phone: [phone]   Fax:  [phone]  Name: RICA HEATHER MRN: [id number] Date of Birth: October 14, 1940

## 2017-08-08 ENCOUNTER — Ambulatory Visit: Payer: PPO

## 2017-08-08 DIAGNOSIS — M545 Low back pain, unspecified: Secondary | ICD-10-CM

## 2017-08-08 DIAGNOSIS — G8929 Other chronic pain: Secondary | ICD-10-CM

## 2017-08-08 DIAGNOSIS — M6281 Muscle weakness (generalized): Secondary | ICD-10-CM

## 2017-08-08 NOTE — Therapy (Signed)
[facility] [address] [place], [place], [address] Phone: [phone]   Fax:  [phone]  Physical Therapy Treatment  Patient Details  Name: Kathleen Schneider MRN: [id number] Date of Birth: [birth year] Referring Provider: Arlice Colt, MD   Encounter Date: 08/08/2017  PT End of Session - 08/08/17 1010    Visit Number  8    Date for PT Re-Evaluation  09/09/17    Authorization Type  medicare    PT Start Time  0929    PT Stop Time  1012    PT Time Calculation (min)  43 min    Activity Tolerance  Patient tolerated treatment well    Behavior During Therapy  [facility] for tasks assessed/performed       Past Medical History:  Diagnosis Date  . Vision abnormalities     Past Surgical History:  Procedure Laterality Date  . CHOLECYSTECTOMY, LAPAROSCOPIC      There were no vitals filed for this visit.  Subjective Assessment - 08/08/17 0936    Subjective  I'm feeling "blah" today.  My back is feeling better.  I feel 40% overall improvement since the start of care.    Currently in Pain?  Yes    Pain Score  2     Pain Location  Buttocks    Pain Orientation  Right                       [facility] Adult PT Treatment/Exercise - 08/08/17 0001      Exercises   Exercises  Knee/Hip      Lumbar Exercises: Stretches   Single Knee to Chest Stretch  Right;Left;3 reps;20 seconds    Lower Trunk Rotation  3 reps;20 seconds      Lumbar Exercises: Aerobic   Nustep  L3 x 9 min 2 laps      Lumbar Exercises: Supine   Bridge with Ball Squeeze  20 reps;5 seconds      Knee/Hip Exercises: Standing   Hip Flexion  Stengthening;Both;2 sets;10 reps;Knee bent    Hip Flexion Limitations  2#    Hip Abduction  Stengthening;Both;2 sets;10 reps    Abduction Limitations  2# added    Hip Extension  Stengthening;Both;2 sets;10 reps    Extension Limitations  2#               PT Short Term Goals - 08/05/17 1008      PT  SHORT TERM GOAL #1   Title  be independent in initial HEP    Status  Achieved      PT SHORT TERM GOAL #2   Title  verbalized and demonstrate correct body mechanics and postural modifications for lumbar protection with daily tasks    Status  Achieved      PT SHORT TERM GOAL #3   Title  report < or = to 5/10 LBP with standing and walking    Baseline  up to 7/10    Time  4    Period  Weeks    Status  Achieved        PT Long Term Goals - 07/15/17 1501      PT LONG TERM GOAL #1   Title  be independent in advanced HEP    Time  8    Period  Weeks    Status  New    Target Date  09/09/17      PT LONG TERM GOAL #2  Title  walk > or = 10 minutes without limitation due to LBP    Time  8    Period  Weeks    Status  New    Target Date  09/09/17      PT LONG TERM GOAL #3   Title  report < or = to 3/10 LBP with standing activities at home    Time  8    Period  Weeks    Status  New    Target Date  09/09/17      PT LONG TERM GOAL #4   Title  reduce FOTO to < or = to 44% limitation    Time  8    Period  Weeks    Status  New    Target Date  09/09/17      PT LONG TERM GOAL #5   Title  perform regular walking daily in her driveway and verbalize understanding of how to progress    Time  8    Period  Weeks    Status  New    Target Date  09/09/17            Plan - 08/08/17 0944    Clinical Impression Statement  Pt reports 40% overall improvement in LBP since the start of care.  Pt was able to complete standing LE and core strength exercises today.  Pt requires verbal cues for posture and alignment with exercises in the clinic.  Pt with core weakness and hip stiffness and will continue to benefit from skilled PT for strength, flexibility and pain management as needed.      Rehab Potential  Good    PT Frequency  2x / week    PT Duration  8 weeks    PT Treatment/Interventions  ADLs/Self Care Home Management;Cryotherapy;Electrical Stimulation;Ultrasound;Functional mobility  training;Therapeutic activities;Therapeutic exercise;Patient/family education;Neuromuscular re-education;Manual techniques;Passive range of motion;Taping;Dry needling    PT Next Visit Plan  hip stability, continue core strength, pain management as needed.      PT Home Exercise Plan  access code: [email or web address]     Consulted and Agree with Plan of Care  Patient       Patient will benefit from skilled therapeutic intervention in order to improve the following deficits and impairments:  Impaired flexibility, Decreased activity tolerance, Postural dysfunction, Increased muscle spasms, Improper body mechanics, Pain  Visit Diagnosis: Chronic bilateral low back pain without sciatica  Muscle weakness (generalized)     Problem List Patient Active Problem List   Diagnosis Date Noted  . Facet joint syndrome 05/13/2017  . Spinal stenosis, lumbar 09/25/2016  . Lower back pain 03/27/2016  . Excessive daytime sleepiness 05/24/2015  . Postherpetic neuralgia 02/18/2015  . Dysesthesia 02/18/2015  . Insomnia 02/18/2015  . Snoring 02/18/2015   Sigurd Sos, PT 08/08/17 10:16 AM  [facility] [address], [place], [place], [address] Phone: [phone]   Fax:  [phone]  Name: MARKIA KYER MRN: [id number] Date of Birth: 1940/07/18

## 2017-08-12 ENCOUNTER — Ambulatory Visit: Payer: PPO

## 2017-08-12 DIAGNOSIS — M545 Low back pain: Principal | ICD-10-CM

## 2017-08-12 DIAGNOSIS — G8929 Other chronic pain: Secondary | ICD-10-CM

## 2017-08-12 DIAGNOSIS — M6281 Muscle weakness (generalized): Secondary | ICD-10-CM

## 2017-08-12 NOTE — Patient Instructions (Signed)
Pelvic floor activation exercises  5 second hold x 10, many times a day.    [facility] [address], [place], [place] [address] Phone # [phone] Fax [phone]

## 2017-08-12 NOTE — Therapy (Signed)
[facility] [address], [place], [place], [address] Phone: [phone]   Fax:  [phone]  Physical Therapy Treatment  Patient Details  Name: Kathleen Schneider MRN: [id number] Date of Birth: Sep 19, 1940 Referring Provider: Arlice Colt, MD   Encounter Date: 08/12/2017  PT End of Session - 08/12/17 1142    Visit Number  9    Date for PT Re-Evaluation  09/09/17    Authorization Type  medicare    PT Start Time  1059    PT Stop Time  1143    PT Time Calculation (min)  44 min    Activity Tolerance  Patient tolerated treatment well    Behavior During Therapy  [facility] for tasks assessed/performed       Past Medical History:  Diagnosis Date  . Vision abnormalities     Past Surgical History:  Procedure Laterality Date  . CHOLECYSTECTOMY, LAPAROSCOPIC      There were no vitals filed for this visit.  Subjective Assessment - 08/12/17 1109    Subjective  My back is feeling better overall. 40% better overall.    Pain Score  3     Pain Location  Back    Pain Orientation  Right    Pain Descriptors / Indicators  Aching    Pain Type  Chronic pain    Pain Onset  More than a month ago    Pain Frequency  Constant    Aggravating Factors   walking, standing, housework    Pain Relieving Factors  sitting, ibuprofen                       [facility] Adult PT Treatment/Exercise - 08/12/17 0001      Lumbar Exercises: Stretches   Single Knee to Chest Stretch  --    Lower Trunk Rotation  --      Lumbar Exercises: Aerobic   Nustep  L3 x 9 min 2 laps      Lumbar Exercises: Supine   Ab Set  20 reps added pelvic floor contraction    Pelvic Tilt  -- leg drop outs    Heel Slides  10 reps    Bridge with Ball Squeeze  --      Knee/Hip Exercises: Standing   Hip Flexion  Stengthening;Both;2 sets;10 reps;Knee bent    Hip Flexion Limitations  2#    Hip Abduction  Stengthening;Both;2 sets;10 reps    Abduction Limitations   2# added    Hip Extension  Stengthening;Both;2 sets;10 reps    Extension Limitations  2#             PT Education - 08/12/17 1137    Education provided  Yes    Education Details  [email or web address] access code: pelvic floor activation    Person(s) Educated  Patient    Methods  Explanation;Demonstration;Handout    Comprehension  Verbalized understanding;Returned demonstration       PT Short Term Goals - 08/05/17 1008      PT SHORT TERM GOAL #1   Title  be independent in initial HEP    Status  Achieved      PT SHORT TERM GOAL #2   Title  verbalized and demonstrate correct body mechanics and postural modifications for lumbar protection with daily tasks    Status  Achieved      PT SHORT TERM GOAL #3   Title  report < or = to 5/10 LBP with standing and walking  Baseline  up to 7/10    Time  4    Period  Weeks    Status  Achieved        PT Long Term Goals - 07/15/17 1501      PT LONG TERM GOAL #1   Title  be independent in advanced HEP    Time  8    Period  Weeks    Status  New    Target Date  09/09/17      PT LONG TERM GOAL #2   Title  walk > or = 10 minutes without limitation due to LBP    Time  8    Period  Weeks    Status  New    Target Date  09/09/17      PT LONG TERM GOAL #3   Title  report < or = to 3/10 LBP with standing activities at home    Time  8    Period  Weeks    Status  New    Target Date  09/09/17      PT LONG TERM GOAL #4   Title  reduce FOTO to < or = to 44% limitation    Time  8    Period  Weeks    Status  New    Target Date  09/09/17      PT LONG TERM GOAL #5   Title  perform regular walking daily in her driveway and verbalize understanding of how to progress    Time  8    Period  Weeks    Status  New    Target Date  09/09/17            Plan - 08/12/17 1110    Clinical Impression Statement  Pt reports 40% overall improvement in LBP since the start of care.  Pt was able to complete standing LE and core strength exercises  without difficulty or increased pain.  Pt requires minor verbal and tactiel cues for posture and alignment with exercise in the clinic.  Pt with continued intermittent Rt sided lumbar and buttock pain that is related to increased activity.  Pt will continue to benefit from skilled PT for progression of core strength, flexibility and pain management as needed.      Rehab Potential  Good    PT Frequency  2x / week    PT Duration  8 weeks    PT Treatment/Interventions  ADLs/Self Care Home Management;Cryotherapy;Electrical Stimulation;Ultrasound;Functional mobility training;Therapeutic activities;Therapeutic exercise;Patient/family education;Neuromuscular re-education;Manual techniques;Passive range of motion;Taping;Dry needling    PT Next Visit Plan  hip stability, continue core strength, pain management as needed.      PT Home Exercise Plan  access code: [email or web address]     Consulted and Agree with Plan of Care  Patient       Patient will benefit from skilled therapeutic intervention in order to improve the following deficits and impairments:  Impaired flexibility, Decreased activity tolerance, Postural dysfunction, Increased muscle spasms, Improper body mechanics, Pain  Visit Diagnosis: Chronic bilateral low back pain without sciatica  Muscle weakness (generalized)     Problem List Patient Active Problem List   Diagnosis Date Noted  . Facet joint syndrome 05/13/2017  . Spinal stenosis, lumbar 09/25/2016  . Lower back pain 03/27/2016  . Excessive daytime sleepiness 05/24/2015  . Postherpetic neuralgia 02/18/2015  . Dysesthesia 02/18/2015  . Insomnia 02/18/2015  . Snoring 02/18/2015     Sigurd Sos, PT 08/12/17 11:45 AM  [facility] [address], [place], [place], [address] Phone: [phone]   Fax:  [phone]  Name: EULANDA DORION MRN: [id number] Date of Birth: Nov 06, 1940

## 2017-08-15 ENCOUNTER — Encounter: Payer: Self-pay | Admitting: Physical Therapy

## 2017-08-15 ENCOUNTER — Ambulatory Visit: Payer: PPO | Attending: Neurology | Admitting: Physical Therapy

## 2017-08-15 DIAGNOSIS — M545 Low back pain: Secondary | ICD-10-CM | POA: Diagnosis not present

## 2017-08-15 DIAGNOSIS — G8929 Other chronic pain: Secondary | ICD-10-CM | POA: Diagnosis not present

## 2017-08-15 DIAGNOSIS — M6281 Muscle weakness (generalized): Secondary | ICD-10-CM | POA: Diagnosis not present

## 2017-08-15 NOTE — Therapy (Signed)
[facility] [address] [place], [place], [address] Phone: [phone]   Fax:  [phone]  Physical Therapy Treatment  Patient Details  Name: Kathleen Schneider MRN: [id number] Date of Birth: Jan 20, 1941 Referring Provider: Arlice Colt, MD   Progress Note Reporting Period 07/15/17 to 08/15/17  See note below for Objective Data and Assessment of Progress/Goals.    Encounter Date: 08/15/2017  PT End of Session - 08/15/17 1617    Visit Number  10    Date for PT Re-Evaluation  09/09/17    Authorization Type  medicare    PT Start Time  1534    PT Stop Time  1615    PT Time Calculation (min)  41 min    Activity Tolerance  Patient tolerated treatment well    Behavior During Therapy  [facility] for tasks assessed/performed              Past Medical History:  Diagnosis Date  . Vision abnormalities     Past Surgical History:  Procedure Laterality Date  . CHOLECYSTECTOMY, LAPAROSCOPIC      There were no vitals filed for this visit.  Subjective Assessment - 08/15/17 1535    Subjective  back is doing alright; seeing pt later in day today and no significant worsening of symptoms; pain up to 5/10 with standing activities    How long can you walk comfortably?  "I'm afraid to" when asking about time limits    Patient Stated Goals  reduce LBP, stand and walk longer without limitation    Currently in Pain?  Yes    Pain Score  3     Pain Location  Back    Pain Orientation  Right    Pain Descriptors / Indicators  Aching    Pain Onset  More than a month ago    Pain Frequency  Constant    Aggravating Factors   walking, standing, housework    Pain Relieving Factors  sitting, ibuprofen         [facility] PT Assessment - 08/15/17 1542      Observation/Other Assessments   Focus on Therapeutic Outcomes (FOTO)   40 (60% limitation)                   [facility] Adult PT Treatment/Exercise - 08/15/17 1540      Self-Care    Self-Care  Other Self-Care Comments    Other Self-Care Comments   current progress, goals and FOTO score      Lumbar Exercises: Stretches   Prone on Elbows Stretch  60 seconds;3 reps continuous      Lumbar Exercises: Aerobic   Nustep  L4 x 8 min PT present      Lumbar Exercises: Supine   Ab Set  20 reps added pelvic floor contraction    Bridge  Compliant;20 reps;5 seconds on red physioball    Other Supine Lumbar Exercises  with abdominal bracing: horizontal abduction x 20 with green theraband; external rotation with green theraband x 20 reps; overhead pull with green theraband x 20 rep               PT Short Term Goals - 08/05/17 1008      PT SHORT TERM GOAL #1   Title  be independent in initial HEP    Status  Achieved      PT SHORT TERM GOAL #2   Title  verbalized and demonstrate correct body mechanics and postural modifications for  lumbar protection with daily tasks    Status  Achieved      PT SHORT TERM GOAL #3   Title  report < or = to 5/10 LBP with standing and walking    Baseline  up to 7/10    Time  4    Period  Weeks    Status  Achieved        PT Long Term Goals - 08/15/17 1606      PT LONG TERM GOAL #1   Title  be independent in advanced HEP    Time  8    Period  Weeks    Status  On-going    Target Date  09/09/17      PT LONG TERM GOAL #2   Title  walk > or = 10 minutes without limitation due to LBP    Baseline  08/15/17: "I'm afraid to" when asked about walking for 10 min    Time  8    Period  Weeks    Status  On-going    Target Date  09/09/17      PT LONG TERM GOAL #3   Title  report < or = to 3/10 LBP with standing activities at home    Baseline  08/15/17: up to 5/10    Time  8    Period  Weeks    Status  On-going    Target Date  09/09/17      PT LONG TERM GOAL #4   Title  reduce FOTO to < or = to 44% limitation    Baseline  08/15/17: decreased to 60% limitation    Time  8    Period  Weeks    Status  On-going    Target Date  09/09/17       PT LONG TERM GOAL #5   Title  perform regular walking daily in her driveway and verbalize understanding of how to progress    Baseline  08/15/17: reports doing one lap to mailbox and back (unable to elaborate on distance or time)    Time  8    Period  Weeks    Status  Partially Met    Target Date  09/09/17            Plan - 08/15/17 1617    Clinical Impression Statement  Pt has met all STGs and is progressing towards LTGs.  Reports overall 40% improvement despite FOTO score decreasing 4%.  Encouraged to increase walking tolerance as able to begin working towards increased activity and fitness.  Will continue to benefit from PT to maximize function.    Rehab Potential  Good    PT Frequency  2x / week    PT Duration  8 weeks    PT Treatment/Interventions  ADLs/Self Care Home Management;Cryotherapy;Electrical Stimulation;Ultrasound;Functional mobility training;Therapeutic activities;Therapeutic exercise;Patient/family education;Neuromuscular re-education;Manual techniques;Passive range of motion;Taping;Dry needling    PT Next Visit Plan  hip stability, continue core strength, pain management as needed.      PT Home Exercise Plan  access code: [email or web address]     Consulted and Agree with Plan of Care  Patient       Patient will benefit from skilled therapeutic intervention in order to improve the following deficits and impairments:  Impaired flexibility, Decreased activity tolerance, Postural dysfunction, Increased muscle spasms, Improper body mechanics, Pain  Visit Diagnosis: Chronic bilateral low back pain without sciatica  Muscle weakness (generalized)     Problem List Patient Active Problem List  Diagnosis Date Noted  . Facet joint syndrome 05/13/2017  . Spinal stenosis, lumbar 09/25/2016  . Lower back pain 03/27/2016  . Excessive daytime sleepiness 05/24/2015  . Postherpetic neuralgia 02/18/2015  . Dysesthesia 02/18/2015  . Insomnia 02/18/2015  . Snoring 02/18/2015       Laureen Abrahams, PT, DPT 08/15/17 4:20 PM     [facility] [address] [place], [place], [address] Phone: [phone]   Fax:  [phone]  Name: Kathleen Schneider MRN: [id number] Date of Birth: 10-27-1940

## 2017-08-19 ENCOUNTER — Ambulatory Visit: Payer: PPO | Admitting: Physical Therapy

## 2017-08-19 ENCOUNTER — Encounter: Payer: Self-pay | Admitting: Physical Therapy

## 2017-08-19 DIAGNOSIS — M6281 Muscle weakness (generalized): Secondary | ICD-10-CM

## 2017-08-19 DIAGNOSIS — G8929 Other chronic pain: Secondary | ICD-10-CM

## 2017-08-19 DIAGNOSIS — M545 Low back pain: Principal | ICD-10-CM

## 2017-08-19 NOTE — Therapy (Signed)
[facility] [address] [place], [place], [address] Phone: [phone]   Fax:  [phone]  Physical Therapy Treatment  Patient Details  Name: Kathleen Schneider MRN: [id number] Date of Birth: January 10, 1941 Referring Provider: Arlice Colt, MD   Encounter Date: 08/19/2017  PT End of Session - 08/19/17 1010    Visit Number  11    Date for PT Re-Evaluation  09/09/17    Authorization Type  medicare    PT Start Time  0930    PT Stop Time  1010    PT Time Calculation (min)  40 min    Activity Tolerance  Patient tolerated treatment well    Behavior During Therapy  [facility] for tasks assessed/performed       Past Medical History:  Diagnosis Date  . Vision abnormalities     Past Surgical History:  Procedure Laterality Date  . CHOLECYSTECTOMY, LAPAROSCOPIC      There were no vitals filed for this visit.  Subjective Assessment - 08/19/17 0935    Subjective  feeling much better this week, pain is better.      How long can you walk comfortably?  "I'm afraid to" when asking about time limits    Patient Stated Goals  reduce LBP, stand and walk longer without limitation    Currently in Pain?  No/denies    Pain Score  0-No pain    Pain Onset  More than a month ago                       [facility] Adult PT Treatment/Exercise - 08/19/17 0936      Lumbar Exercises: Aerobic   Nustep  L4 x 9 min (2 laps)      Lumbar Exercises: Supine   Ab Set  20 reps    Bridge  Compliant;20 reps;5 seconds;Non-compliant on red physioball, and on mat with green tband    Other Supine Lumbar Exercises  hamstring curls with red physioball and abdominal bracing 2x10      Lumbar Exercises: Quadruped   Madcat/Old Horse  10 reps 2 sets    Straight Leg Raise  10 reps alternating, 2 sets               PT Short Term Goals - 08/05/17 1008      PT SHORT TERM GOAL #1   Title  be independent in initial HEP    Status  Achieved       PT SHORT TERM GOAL #2   Title  verbalized and demonstrate correct body mechanics and postural modifications for lumbar protection with daily tasks    Status  Achieved      PT SHORT TERM GOAL #3   Title  report < or = to 5/10 LBP with standing and walking    Baseline  up to 7/10    Time  4    Period  Weeks    Status  Achieved        PT Long Term Goals - 08/15/17 1606      PT LONG TERM GOAL #1   Title  be independent in advanced HEP    Time  8    Period  Weeks    Status  On-going    Target Date  09/09/17      PT LONG TERM GOAL #2   Title  walk > or = 10 minutes without limitation due to LBP    Baseline  08/15/17: "  I'm afraid to" when asked about walking for 10 min    Time  8    Period  Weeks    Status  On-going    Target Date  09/09/17      PT LONG TERM GOAL #3   Title  report < or = to 3/10 LBP with standing activities at home    Baseline  08/15/17: up to 5/10    Time  8    Period  Weeks    Status  On-going    Target Date  09/09/17      PT LONG TERM GOAL #4   Title  reduce FOTO to < or = to 44% limitation    Baseline  08/15/17: decreased to 60% limitation    Time  8    Period  Weeks    Status  On-going    Target Date  09/09/17      PT LONG TERM GOAL #5   Title  perform regular walking daily in her driveway and verbalize understanding of how to progress    Baseline  08/15/17: reports doing one lap to mailbox and back (unable to elaborate on distance or time)    Time  8    Period  Weeks    Status  Partially Met    Target Date  09/09/17            Plan - 08/19/17 1011    Clinical Impression Statement  Pt tolerated session well today with increased core and hip strengthening exercises.  Progressing well with PT.    Rehab Potential  Good    PT Frequency  2x / week    PT Duration  8 weeks    PT Treatment/Interventions  ADLs/Self Care Home Management;Cryotherapy;Electrical Stimulation;Ultrasound;Functional mobility training;Therapeutic activities;Therapeutic  exercise;Patient/family education;Neuromuscular re-education;Manual techniques;Passive range of motion;Taping;Dry needling    PT Next Visit Plan  hip stability, continue core strength, pain management as needed.      PT Home Exercise Plan  access code: [email or web address]     Consulted and Agree with Plan of Care  Patient       Patient will benefit from skilled therapeutic intervention in order to improve the following deficits and impairments:  Impaired flexibility, Decreased activity tolerance, Postural dysfunction, Increased muscle spasms, Improper body mechanics, Pain  Visit Diagnosis: Chronic bilateral low back pain without sciatica  Muscle weakness (generalized)     Problem List Patient Active Problem List   Diagnosis Date Noted  . Facet joint syndrome 05/13/2017  . Spinal stenosis, lumbar 09/25/2016  . Lower back pain 03/27/2016  . Excessive daytime sleepiness 05/24/2015  . Postherpetic neuralgia 02/18/2015  . Dysesthesia 02/18/2015  . Insomnia 02/18/2015  . Snoring 02/18/2015      Laureen Abrahams, PT, DPT 08/19/17 10:12 AM    [facility] [address] [place], [place], [address] Phone: [phone]   Fax:  [phone]  Name: Kathleen Schneider MRN: [id number] Date of Birth: [birth year]

## 2017-08-21 DIAGNOSIS — H2513 Age-related nuclear cataract, bilateral: Secondary | ICD-10-CM | POA: Diagnosis not present

## 2017-08-22 ENCOUNTER — Ambulatory Visit: Payer: PPO

## 2017-08-22 DIAGNOSIS — M6281 Muscle weakness (generalized): Secondary | ICD-10-CM

## 2017-08-22 DIAGNOSIS — M545 Low back pain: Principal | ICD-10-CM

## 2017-08-22 DIAGNOSIS — G8929 Other chronic pain: Secondary | ICD-10-CM

## 2017-08-22 NOTE — Patient Instructions (Signed)
Cat/Camel x10, 5 second hold 2x/day  [facility] [address], [place], [place] [address] Phone # [phone] Fax [phone]

## 2017-08-22 NOTE — Therapy (Signed)
[facility] [address], [place], [place], [address] Phone: [phone]   Fax:  [phone]  Physical Therapy Treatment  Patient Details  Name: Kathleen Schneider MRN: [id number] Date of Birth: 03-Jul-1940 Referring Provider: Arlice Colt, MD   Encounter Date: 08/22/2017  PT End of Session - 08/22/17 1220    Visit Number  12    Date for PT Re-Evaluation  09/09/17    Authorization Type  medicare    PT Start Time  1146    PT Stop Time  1225    PT Time Calculation (min)  39 min    Activity Tolerance  Patient tolerated treatment well    Behavior During Therapy  [facility] for tasks assessed/performed       Past Medical History:  Diagnosis Date  . Vision abnormalities     Past Surgical History:  Procedure Laterality Date  . CHOLECYSTECTOMY, LAPAROSCOPIC      There were no vitals filed for this visit.  Subjective Assessment - 08/22/17 1150    Subjective  I'm feeling better.  I feel 35% overall improvement since the start of therapy.      Currently in Pain?  Yes    Pain Score  3     Pain Location  Back    Pain Orientation  Right;Lower    Pain Descriptors / Indicators  Aching    Pain Type  Chronic pain    Pain Onset  More than a month ago    Pain Frequency  Constant    Aggravating Factors   walking, standing, housework    Pain Relieving Factors  sitting, ibuprofen                       [facility] Adult PT Treatment/Exercise - 08/22/17 0001      Lumbar Exercises: Stretches   Active Hamstring Stretch  3 reps;20 seconds    Lower Trunk Rotation  3 reps;20 seconds ball under legs      Lumbar Exercises: Aerobic   Nustep  L4 x 9 min (2 laps)      Lumbar Exercises: Supine   Ab Set  20 reps    Bridge  Compliant;20 reps;5 seconds;Non-compliant on red physioball, and on mat with green tband    Other Supine Lumbar Exercises  seated chops with red weighted ball x10 bil.      Lumbar Exercises: Quadruped   Madcat/Old Horse  10 reps 2 sets    Straight Leg Raise  10 reps alternating, 2 sets      Knee/Hip Exercises: Seated   Ball Squeeze  x 20, 5 second hold with abdominal bracing             PT Education - 08/22/17 1204    Education provided  Yes    Education Details  [id number]  quadruped flexibility    Person(s) Educated  Patient    Methods  Explanation;Demonstration;Handout    Comprehension  Verbalized understanding;Returned demonstration       PT Short Term Goals - 08/05/17 1008      PT SHORT TERM GOAL #1   Title  be independent in initial HEP    Status  Achieved      PT SHORT TERM GOAL #2   Title  verbalized and demonstrate correct body mechanics and postural modifications for lumbar protection with daily tasks    Status  Achieved      PT SHORT TERM GOAL #3   Title  report < or = to 5/10 LBP with standing and walking    Baseline  up to 7/10    Time  4    Period  Weeks    Status  Achieved        PT Long Term Goals - 08/15/17 1606      PT LONG TERM GOAL #1   Title  be independent in advanced HEP    Time  8    Period  Weeks    Status  On-going    Target Date  09/09/17      PT LONG TERM GOAL #2   Title  walk > or = 10 minutes without limitation due to LBP    Baseline  08/15/17: "I'm afraid to" when asked about walking for 10 min    Time  8    Period  Weeks    Status  On-going    Target Date  09/09/17      PT LONG TERM GOAL #3   Title  report < or = to 3/10 LBP with standing activities at home    Baseline  08/15/17: up to 5/10    Time  8    Period  Weeks    Status  On-going    Target Date  09/09/17      PT LONG TERM GOAL #4   Title  reduce FOTO to < or = to 44% limitation    Baseline  08/15/17: decreased to 60% limitation    Time  8    Period  Weeks    Status  On-going    Target Date  09/09/17      PT LONG TERM GOAL #5   Title  perform regular walking daily in her driveway and verbalize understanding of how to progress    Baseline  08/15/17: reports  doing one lap to mailbox and back (unable to elaborate on distance or time)    Time  8    Period  Weeks    Status  Partially Met    Target Date  09/09/17            Plan - 08/22/17 1152    Clinical Impression Statement  Pt reports 35% overall improvement in symptoms since the start of care.  Pt tolerates increased levels of exercise in the clinic without limitation.  Pt is compliant and independent in HEP for strength and flexibility.  Pt is able to stand and walk for 30 minutes at home and the community now.  Pt will continue to benefit from skilled PT for core strength, flexibility, manual and modalities.      Rehab Potential  Good    PT Frequency  2x / week    PT Duration  8 weeks    PT Treatment/Interventions  ADLs/Self Care Home Management;Cryotherapy;Electrical Stimulation;Ultrasound;Functional mobility training;Therapeutic activities;Therapeutic exercise;Patient/family education;Neuromuscular re-education;Manual techniques;Passive range of motion;Taping;Dry needling    PT Next Visit Plan  hip stability, continue core strength, pain management as needed.      PT Home Exercise Plan  access code: [email or web address]     Consulted and Agree with Plan of Care  Patient       Patient will benefit from skilled therapeutic intervention in order to improve the following deficits and impairments:  Impaired flexibility, Decreased activity tolerance, Postural dysfunction, Increased muscle spasms, Improper body mechanics, Pain  Visit Diagnosis: Chronic bilateral low back pain without sciatica  Muscle weakness (generalized)     Problem List Patient Active Problem List  Diagnosis Date Noted  . Facet joint syndrome 05/13/2017  . Spinal stenosis, lumbar 09/25/2016  . Lower back pain 03/27/2016  . Excessive daytime sleepiness 05/24/2015  . Postherpetic neuralgia 02/18/2015  . Dysesthesia 02/18/2015  . Insomnia 02/18/2015  . Snoring 02/18/2015   Sigurd Sos, PT 08/22/17 12:23 PM  [facility] [address] [place], [place], [address] Phone: [phone]   Fax:  [phone]  Name: CAMYLLE WHICKER MRN: [id number] Date of Birth: January 28, 1941

## 2017-08-23 DIAGNOSIS — S80811A Abrasion, right lower leg, initial encounter: Secondary | ICD-10-CM | POA: Diagnosis not present

## 2017-08-26 ENCOUNTER — Ambulatory Visit: Payer: PPO

## 2017-08-26 DIAGNOSIS — M6281 Muscle weakness (generalized): Secondary | ICD-10-CM

## 2017-08-26 DIAGNOSIS — M545 Low back pain, unspecified: Secondary | ICD-10-CM

## 2017-08-26 DIAGNOSIS — G8929 Other chronic pain: Secondary | ICD-10-CM

## 2017-08-26 NOTE — Therapy (Signed)
[facility] [address] [place], [place], [address] Phone: [phone]   Fax:  [phone]  Physical Therapy Treatment  Patient Details  Name: Kathleen Schneider MRN: [id number] Date of Birth: [birth year] Referring Provider: Arlice Colt, MD   Encounter Date: 08/26/2017  PT End of Session - 08/26/17 1139    Visit Number  13    Date for PT Re-Evaluation  09/09/17    Authorization Type  medicare  KX at 81     PT Start Time  1059    PT Stop Time  1139    PT Time Calculation (min)  40 min    Activity Tolerance  Patient tolerated treatment well    Behavior During Therapy  [facility] for tasks assessed/performed       Past Medical History:  Diagnosis Date  . Vision abnormalities     Past Surgical History:  Procedure Laterality Date  . CHOLECYSTECTOMY, LAPAROSCOPIC      There were no vitals filed for this visit.  Subjective Assessment - 08/26/17 1111    Subjective  I'm feeling better.     Currently in Pain?  Yes    Pain Score  2     Pain Location  Back    Pain Orientation  Right;Lower    Pain Descriptors / Indicators  Aching    Pain Type  Chronic pain    Pain Onset  More than a month ago    Pain Frequency  Constant    Aggravating Factors   walking, standing    Pain Relieving Factors  sitting, pain medication as needed                       [facility] Adult PT Treatment/Exercise - 08/26/17 0001      Lumbar Exercises: Stretches   Active Hamstring Stretch  3 reps;20 seconds    Lower Trunk Rotation  3 reps;20 seconds ball under legs      Lumbar Exercises: Aerobic   Nustep  L4 x 9 min (2 laps)      Lumbar Exercises: Supine   Bridge  Compliant;20 reps;5 seconds;Non-compliant    Other Supine Lumbar Exercises  seated chops with blue weighted ball x10 bil.      Knee/Hip Exercises: Standing   Hip Flexion  Stengthening;Both;2 sets;10 reps    Hip Flexion Limitations  2# added    Hip Abduction   Stengthening;Both;2 sets;10 reps    Abduction Limitations  2# added    Hip Extension  Stengthening;Both;2 sets;10 reps    Extension Limitations  2#      Knee/Hip Exercises: Seated   Ball Squeeze  x 20, 5 second hold with abdominal bracing weighted heavy ball               PT Short Term Goals - 08/05/17 1008      PT SHORT TERM GOAL #1   Title  be independent in initial HEP    Status  Achieved      PT SHORT TERM GOAL #2   Title  verbalized and demonstrate correct body mechanics and postural modifications for lumbar protection with daily tasks    Status  Achieved      PT SHORT TERM GOAL #3   Title  report < or = to 5/10 LBP with standing and walking    Baseline  up to 7/10    Time  4    Period  Weeks    Status  Achieved        PT Long Term Goals - 08/15/17 1606      PT LONG TERM GOAL #1   Title  be independent in advanced HEP    Time  8    Period  Weeks    Status  On-going    Target Date  09/09/17      PT LONG TERM GOAL #2   Title  walk > or = 10 minutes without limitation due to LBP    Baseline  08/15/17: "I'm afraid to" when asked about walking for 10 min    Time  8    Period  Weeks    Status  On-going    Target Date  09/09/17      PT LONG TERM GOAL #3   Title  report < or = to 3/10 LBP with standing activities at home    Baseline  08/15/17: up to 5/10    Time  8    Period  Weeks    Status  On-going    Target Date  09/09/17      PT LONG TERM GOAL #4   Title  reduce FOTO to < or = to 44% limitation    Baseline  08/15/17: decreased to 60% limitation    Time  8    Period  Weeks    Status  On-going    Target Date  09/09/17      PT LONG TERM GOAL #5   Title  perform regular walking daily in her driveway and verbalize understanding of how to progress    Baseline  08/15/17: reports doing one lap to mailbox and back (unable to elaborate on distance or time)    Time  8    Period  Weeks    Status  Partially Met    Target Date  09/09/17             Plan - 08/26/17 1117    Clinical Impression Statement  Pt reports 35% overall improvement in symptoms since the start of care.  Pt was able to stand and walk at church all day on Saturday without difficulty and has improved tolerance for walking in the community without pain.  Pt requires verbal cues for posture and speed with standing hip exercises today.  Pt will continue to benefit from skilled PT for core strength, flexibility and endurance activities.    Rehab Potential  Good    PT Frequency  2x / week    PT Duration  8 weeks    PT Treatment/Interventions  ADLs/Self Care Home Management;Cryotherapy;Electrical Stimulation;Ultrasound;Functional mobility training;Therapeutic activities;Therapeutic exercise;Patient/family education;Neuromuscular re-education;Manual techniques;Passive range of motion;Taping;Dry needling    PT Next Visit Plan  hip stability, continue core strength, pain management as needed.      Consulted and Agree with Plan of Care  Patient       Patient will benefit from skilled therapeutic intervention in order to improve the following deficits and impairments:  Impaired flexibility, Decreased activity tolerance, Postural dysfunction, Increased muscle spasms, Improper body mechanics, Pain  Visit Diagnosis: Chronic bilateral low back pain without sciatica  Muscle weakness (generalized)     Problem List Patient Active Problem List   Diagnosis Date Noted  . Facet joint syndrome 05/13/2017  . Spinal stenosis, lumbar 09/25/2016  . Lower back pain 03/27/2016  . Excessive daytime sleepiness 05/24/2015  . Postherpetic neuralgia 02/18/2015  . Dysesthesia 02/18/2015  . Insomnia 02/18/2015  . Snoring 02/18/2015     Sigurd Sos,  PT 08/26/17 11:42 AM  [facility] [address] [place], [place], [address] Phone: [phone]   Fax:  [phone]  Name: Kathleen Schneider MRN: [id number] Date  of Birth: 1940/10/24

## 2017-08-29 ENCOUNTER — Ambulatory Visit: Payer: PPO

## 2017-08-29 DIAGNOSIS — M6281 Muscle weakness (generalized): Secondary | ICD-10-CM

## 2017-08-29 DIAGNOSIS — M545 Low back pain, unspecified: Secondary | ICD-10-CM

## 2017-08-29 DIAGNOSIS — G8929 Other chronic pain: Secondary | ICD-10-CM

## 2017-08-29 NOTE — Therapy (Signed)
[facility] [address] [place], [place], [address] Phone: [phone]   Fax:  [phone]  Physical Therapy Treatment  Patient Details  Name: Kathleen Schneider MRN: [id number] Date of Birth: Feb 03, 1941 Referring Provider: Arlice Colt, MD   Encounter Date: 08/29/2017  PT End of Session - 08/29/17 1025    Visit Number  14    Date for PT Re-Evaluation  09/09/17    Authorization Type  medicare  KX at 48     PT Start Time  0932    PT Stop Time  1022    PT Time Calculation (min)  50 min    Activity Tolerance  Patient tolerated treatment well    Behavior During Therapy  [facility] for tasks assessed/performed       Past Medical History:  Diagnosis Date  . Vision abnormalities     Past Surgical History:  Procedure Laterality Date  . CHOLECYSTECTOMY, LAPAROSCOPIC      There were no vitals filed for this visit.  Subjective Assessment - 08/29/17 0938    Subjective  I am feeling much better.  I am 35% better overall.  I am very pleased with my ability to do things outside my house now.      Currently in Pain?  Yes    Pain Score  1     Pain Location  Back                       [facility] Adult PT Treatment/Exercise - 08/29/17 0001      Lumbar Exercises: Stretches   Active Hamstring Stretch  3 reps;20 seconds    Lower Trunk Rotation  3 reps;20 seconds ball under legs      Lumbar Exercises: Aerobic   Nustep  L4 x 9 min (2 laps)      Lumbar Exercises: Supine   Bridge  Compliant;20 reps;5 seconds;Non-compliant ball under legs    Bridge with Ball Squeeze  20 reps;5 seconds      Knee/Hip Exercises: Standing   Hip Flexion  Stengthening;Both;2 sets;10 reps    Hip Flexion Limitations  2# added    Hip Abduction  Stengthening;Both;2 sets;10 reps    Abduction Limitations  2# added    Hip Extension  Stengthening;Both;2 sets;10 reps    Extension Limitations  2#      Knee/Hip Exercises: Seated   Ball Squeeze   x 20, 5 second hold with abdominal bracing weighted heavy ball             PT Education - 08/29/17 0947    Education provided  Yes    Education Details  [email or web address] access code    Person(s) Educated  Patient    Methods  Demonstration;Explanation;Handout    Comprehension  Verbalized understanding;Returned demonstration       PT Short Term Goals - 08/29/17 0939      PT SHORT TERM GOAL #3   Title  report < or = to 5/10 LBP with standing and walking    Baseline  3/10    Status  Achieved        PT Long Term Goals - 08/15/17 1606      PT LONG TERM GOAL #1   Title  be independent in advanced HEP    Time  8    Period  Weeks    Status  On-going    Target Date  09/09/17      PT LONG TERM GOAL #2  Title  walk > or = 10 minutes without limitation due to LBP    Baseline  08/15/17: "I'm afraid to" when asked about walking for 10 min    Time  8    Period  Weeks    Status  On-going    Target Date  09/09/17      PT LONG TERM GOAL #3   Title  report < or = to 3/10 LBP with standing activities at home    Baseline  08/15/17: up to 5/10    Time  8    Period  Weeks    Status  On-going    Target Date  09/09/17      PT LONG TERM GOAL #4   Title  reduce FOTO to < or = to 44% limitation    Baseline  08/15/17: decreased to 60% limitation    Time  8    Period  Weeks    Status  On-going    Target Date  09/09/17      PT LONG TERM GOAL #5   Title  perform regular walking daily in her driveway and verbalize understanding of how to progress    Baseline  08/15/17: reports doing one lap to mailbox and back (unable to elaborate on distance or time)    Time  8    Period  Weeks    Status  Partially Met    Target Date  09/09/17            Plan - 08/29/17 0954    Clinical Impression Statement  Pt reports 35% overall improvement in symptoms since the start of care.  Pt reports that she is able to do more out in the community due to reduced pain.  Pt is able to tolerate increased challenge  with exercise today.  pt with up to 3/10 LBP with standing and walking.  Pt will continue to benefit from skilled PT for core strength, flexibility and endurance tasks.    Rehab Potential  Good    PT Frequency  2x / week    PT Duration  8 weeks    PT Treatment/Interventions  ADLs/Self Care Home Management;Cryotherapy;Electrical Stimulation;Ultrasound;Functional mobility training;Therapeutic activities;Therapeutic exercise;Patient/family education;Neuromuscular re-education;Manual techniques;Passive range of motion;Taping;Dry needling    PT Next Visit Plan  hip stability, continue core strength, pain management as needed.      PT Home Exercise Plan  access code: [email or web address]     Consulted and Agree with Plan of Care  Patient       Patient will benefit from skilled therapeutic intervention in order to improve the following deficits and impairments:  Impaired flexibility, Decreased activity tolerance, Postural dysfunction, Increased muscle spasms, Improper body mechanics, Pain  Visit Diagnosis: Chronic bilateral low back pain without sciatica  Muscle weakness (generalized)     Problem List Patient Active Problem List   Diagnosis Date Noted  . Facet joint syndrome 05/13/2017  . Spinal stenosis, lumbar 09/25/2016  . Lower back pain 03/27/2016  . Excessive daytime sleepiness 05/24/2015  . Postherpetic neuralgia 02/18/2015  . Dysesthesia 02/18/2015  . Insomnia 02/18/2015  . Snoring 02/18/2015    Sigurd Sos, PT 08/29/17 10:57 AM  [facility] [address], [place], [place], [address] Phone: [phone]   Fax:  [phone]  Name: Kathleen Schneider MRN: [id number] Date of Birth: 1940/10/29

## 2017-08-29 NOTE — Patient Instructions (Signed)
Access Code: [email or web address]  URL: [email or web address]  Date: 08/29/2017  Prepared by: Sigurd Sos   Exercises  Standing Hip Extension - 10 reps - 2 sets - 1x daily - 7x weekly  Standing Hip Abduction - 10 reps - 2 sets - 1x daily - 7x weekly  Standing hip march- 10 reps-2sets-1x daily

## 2017-09-02 ENCOUNTER — Ambulatory Visit: Payer: PPO

## 2017-09-02 DIAGNOSIS — G8929 Other chronic pain: Secondary | ICD-10-CM

## 2017-09-02 DIAGNOSIS — M545 Low back pain: Principal | ICD-10-CM

## 2017-09-02 DIAGNOSIS — M6281 Muscle weakness (generalized): Secondary | ICD-10-CM

## 2017-09-02 NOTE — Therapy (Signed)
[facility] [address], [place], [place], [address] Phone: [phone]   Fax:  [phone]  Physical Therapy Treatment  Patient Details  Name: Kathleen Schneider MRN: [id number] Date of Birth: 01-09-1941 Referring Provider: Arlice Colt, MD   Encounter Date: 09/02/2017  PT End of Session - 09/02/17 1000    Visit Number  15    Date for PT Re-Evaluation  09/09/17    Authorization Type  medicare  KX at 53     PT Start Time  0928    PT Stop Time  1012    PT Time Calculation (min)  44 min    Activity Tolerance  Patient tolerated treatment well    Behavior During Therapy  [facility] for tasks assessed/performed       Past Medical History:  Diagnosis Date  . Vision abnormalities     Past Surgical History:  Procedure Laterality Date  . CHOLECYSTECTOMY, LAPAROSCOPIC      There were no vitals filed for this visit.  Subjective Assessment - 09/02/17 0928    Subjective  I was busy this morning.  I did well with my pain.      Currently in Pain?  Yes    Pain Score  3     Pain Location  Back    Pain Orientation  Right;Lower    Pain Descriptors / Indicators  Aching    Pain Type  Chronic pain    Pain Onset  More than a month ago    Aggravating Factors   end of the day, walking, standing    Pain Relieving Factors  sitting, pain medication as needed                       [facility] Adult PT Treatment/Exercise - 09/02/17 0001      Lumbar Exercises: Stretches   Active Hamstring Stretch  3 reps;20 seconds    Lower Trunk Rotation  3 reps;20 seconds ball under legs      Lumbar Exercises: Aerobic   Nustep  L4 x 9 min (2 laps)      Lumbar Exercises: Supine   Ab Set  20 reps;5 seconds with blue weighted ball squeeze    Bridge  Compliant;20 reps;5 seconds;Non-compliant ball under legs    Bridge with [employer]  --      Knee/Hip Exercises: Standing   Hip Flexion  Stengthening;Both;2 sets;10 reps    Hip Flexion  Limitations  2# added    Hip Abduction  Stengthening;Both;2 sets;10 reps    Abduction Limitations  2# added    Hip Extension  Stengthening;Both;2 sets;10 reps    Extension Limitations  2#      Knee/Hip Exercises: Seated   Long Arc Quad  Strengthening;Both;2 sets;10 reps;Weights    Long Arc Quad Weight  2 lbs.    Ball Squeeze  x 20, 5 second hold with abdominal bracing weighted heavy ball               PT Short Term Goals - 08/29/17 0939      PT SHORT TERM GOAL #3   Title  report < or = to 5/10 LBP with standing and walking    Baseline  3/10    Status  Achieved        PT Long Term Goals - 09/02/17 0943      PT LONG TERM GOAL #1   Title  be independent in advanced HEP  Time  8    Period  Weeks    Status  On-going      PT LONG TERM GOAL #3   Title  report < or = to 3/10 LBP with standing activities at home    Baseline  up to 4/10    Time  8    Period  Weeks    Status  On-going      PT LONG TERM GOAL #5   Title  perform regular walking daily in her driveway and verbalize understanding of how to progress    Baseline  doing 1x/day, 1 lap.  Her goal is 2x    Time  8    Period  Weeks    Status  On-going            Plan - 09/02/17 0944    Clinical Impression Statement  Pt reports 35% overall improvement in symptoms since the start of care.  Pt reports 2/10 pain early in the day and up to 4/10 LBP at the end of the day.  Pt is walking in her driveway 1x/day and her goal is 2x/day.  Pt requires verbal cues for posture during standing exercise today.  Pt will continue to benefit from skilled PT for core strength, flexibility and endurance tasks.      Rehab Potential  Good    PT Frequency  2x / week    PT Duration  8 weeks    PT Treatment/Interventions  ADLs/Self Care Home Management;Cryotherapy;Electrical Stimulation;Ultrasound;Functional mobility training;Therapeutic activities;Therapeutic exercise;Patient/family education;Neuromuscular re-education;Manual  techniques;Passive range of motion;Taping;Dry needling    PT Next Visit Plan  hip stability, continue core strength, pain management as needed.      PT Home Exercise Plan  access code: [email or web address]     Consulted and Agree with Plan of Care  Patient       Patient will benefit from skilled therapeutic intervention in order to improve the following deficits and impairments:  Impaired flexibility, Decreased activity tolerance, Postural dysfunction, Increased muscle spasms, Improper body mechanics, Pain  Visit Diagnosis: Chronic bilateral low back pain without sciatica  Muscle weakness (generalized)     Problem List Patient Active Problem List   Diagnosis Date Noted  . Facet joint syndrome 05/13/2017  . Spinal stenosis, lumbar 09/25/2016  . Lower back pain 03/27/2016  . Excessive daytime sleepiness 05/24/2015  . Postherpetic neuralgia 02/18/2015  . Dysesthesia 02/18/2015  . Insomnia 02/18/2015  . Snoring 02/18/2015    Sigurd Sos, PT 09/02/17 10:03 AM  [facility] [address] [place], [place], [address] Phone: [phone]   Fax:  [phone]  Name: Kathleen Schneider MRN: [id number] Date of Birth: 1940/04/23

## 2017-09-05 ENCOUNTER — Ambulatory Visit: Payer: PPO

## 2017-09-05 DIAGNOSIS — M545 Low back pain: Principal | ICD-10-CM

## 2017-09-05 DIAGNOSIS — M6281 Muscle weakness (generalized): Secondary | ICD-10-CM

## 2017-09-05 DIAGNOSIS — G8929 Other chronic pain: Secondary | ICD-10-CM

## 2017-09-05 NOTE — Therapy (Signed)
[facility] [address], [place], [place], [address] Phone: [phone]   Fax:  [phone]  Physical Therapy Treatment  Patient Details  Name: LORALI KHAMIS MRN: [id number] Date of Birth: 04/22/1940 Referring Provider: Arlice Colt, MD   Encounter Date: 09/05/2017  PT End of Session - 09/05/17 1001    Visit Number  16    Authorization Type  medicare  KX at 15     PT Start Time  0932    PT Stop Time  1003    PT Time Calculation (min)  31 min       Past Medical History:  Diagnosis Date  . Vision abnormalities     Past Surgical History:  Procedure Laterality Date  . CHOLECYSTECTOMY, LAPAROSCOPIC      There were no vitals filed for this visit.  Subjective Assessment - 09/05/17 0933    Subjective  I am ready for discharge to HEP.  I feel 75% overall improvement since the start.      Patient Stated Goals  reduce LBP, stand and walk longer without limitation    Currently in Pain?  Yes    Pain Score  1     Pain Location  Back    Pain Orientation  Right;Lower    Pain Descriptors / Indicators  Aching    Pain Type  Chronic pain    Pain Onset  More than a month ago         [facility] PT Assessment - 09/05/17 0001      Assessment   Medical Diagnosis  facet joint syndrome and midline low back pain without sciatica      Cognition   Overall Cognitive Status  Within Functional Limits for tasks assessed      Observation/Other Assessments   Focus on Therapeutic Outcomes (FOTO)   44% limitation                   [facility] Adult PT Treatment/Exercise - 09/05/17 0001      Lumbar Exercises: Stretches   Lower Trunk Rotation  3 reps;20 seconds ball under legs      Lumbar Exercises: Aerobic   Nustep  L4 x 9 min (2 laps)      Knee/Hip Exercises: Standing   Hip Flexion  Stengthening;Both;2 sets;10 reps    Hip Flexion Limitations  2# added    Hip Abduction  Stengthening;Both;2 sets;10 reps    Abduction  Limitations  2# added    Hip Extension  Stengthening;Both;2 sets;10 reps    Extension Limitations  2#      Knee/Hip Exercises: Seated   Long Arc Quad  Strengthening;Both;2 sets;10 reps;Weights    Long Arc Quad Weight  2 lbs.    Ball Squeeze  x 20, 5 second hold with abdominal bracing weighted heavy ball               PT Short Term Goals - 08/29/17 0939      PT SHORT TERM GOAL #3   Title  report < or = to 5/10 LBP with standing and walking    Baseline  3/10    Status  Achieved        PT Long Term Goals - 09/05/17 0935      PT LONG TERM GOAL #1   Title  be independent in advanced HEP    Status  Achieved      PT LONG TERM GOAL #2   Title  walk > or =  10 minutes without limitation due to LBP    Baseline  30 minutes    Status  Achieved      PT LONG TERM GOAL #3   Title  report < or = to 3/10 LBP with standing activities at home    Baseline  up to 4/10    Status  Achieved      PT LONG TERM GOAL #4   Title  reduce FOTO to < or = to 44% limitation    Baseline  44% limitation    Status  Achieved      PT LONG TERM GOAL #5   Title  perform regular walking daily in her driveway and verbalize understanding of how to progress    Baseline  doing 1x/day, 1 lap.  Her goal is 2x    Status  Achieved            Plan - 09/05/17 0950    Clinical Impression Statement  Pt is ready to discharge to HEP.  Pt reports 1-4/10 LBP with activity.  Pt is working to address pain with comprehensive HEP for strength and flexibility. Pt is able to now stand and walk for 30 minutes without limitation.  Pt will D/C to HEP today.      PT Next Visit Plan  D/C PT to HEP    PT Home Exercise Plan  access code: [email or web address]     Consulted and Agree with Plan of Care  Patient       Patient will benefit from skilled therapeutic intervention in order to improve the following deficits and impairments:     Visit Diagnosis: Chronic bilateral low back pain without sciatica  Muscle weakness  (generalized)     Problem List Patient Active Problem List   Diagnosis Date Noted  . Facet joint syndrome 05/13/2017  . Spinal stenosis, lumbar 09/25/2016  . Lower back pain 03/27/2016  . Excessive daytime sleepiness 05/24/2015  . Postherpetic neuralgia 02/18/2015  . Dysesthesia 02/18/2015  . Insomnia 02/18/2015  . Snoring 02/18/2015   PHYSICAL THERAPY DISCHARGE SUMMARY  Visits from Start of Care: 16  Current functional level related to goals / functional outcomes: See above for current status.  Pt will D/C to HEP.  Remaining deficits: See above.     Education / Equipment: HEP, posture/body mechanics Plan: Patient agrees to discharge.  Patient goals were partially met. Patient is being discharged due to being pleased with the current functional level.  ?????         Sigurd Sos, PT 09/05/17 10:05 AM  [facility] [address] [place], [place], [address] Phone: [phone]   Fax:  [phone]  Name: SANDRA BRENTS MRN: [id number] Date of Birth: [birth year]

## 2017-10-03 ENCOUNTER — Other Ambulatory Visit: Payer: Self-pay | Admitting: Neurology

## 2017-11-04 ENCOUNTER — Other Ambulatory Visit: Payer: Self-pay | Admitting: Neurology

## 2017-11-11 ENCOUNTER — Encounter: Payer: Self-pay | Admitting: Neurology

## 2017-11-11 ENCOUNTER — Ambulatory Visit: Payer: PPO | Admitting: Neurology

## 2017-11-11 VITALS — BP 127/69 | HR 80 | Ht 64.0 in | Wt 215.0 lb

## 2017-11-11 DIAGNOSIS — M545 Low back pain, unspecified: Secondary | ICD-10-CM

## 2017-11-11 DIAGNOSIS — M47819 Spondylosis without myelopathy or radiculopathy, site unspecified: Secondary | ICD-10-CM

## 2017-11-11 DIAGNOSIS — M47899 Other spondylosis, site unspecified: Secondary | ICD-10-CM

## 2017-11-11 DIAGNOSIS — R350 Frequency of micturition: Secondary | ICD-10-CM

## 2017-11-11 DIAGNOSIS — R208 Other disturbances of skin sensation: Secondary | ICD-10-CM | POA: Diagnosis not present

## 2017-11-11 DIAGNOSIS — B0229 Other postherpetic nervous system involvement: Secondary | ICD-10-CM | POA: Diagnosis not present

## 2017-11-11 MED ORDER — OXYBUTYNIN CHLORIDE 5 MG PO TABS: 5.0000 mg | ORAL_TABLET | Freq: Two times a day (BID) | ORAL | 11 refills | Status: DC

## 2017-11-11 MED ORDER — MELOXICAM 7.5 MG PO TABS: 7.5000 mg | ORAL_TABLET | Freq: Every day | ORAL | 11 refills | Status: DC

## 2017-11-11 MED ORDER — LIDOCAINE 5 % EX PTCH: 2.0000 | MEDICATED_PATCH | CUTANEOUS | 5 refills | Status: DC

## 2017-11-11 NOTE — Progress Notes (Signed)
[facility]  PATIENT: Kathleen Schneider DOB: 01-29-1941  REFERRING DOCTOR OR PCP:  Dr. Nancy Fetter SOURCE: patient and records from Dr. Nancy Fetter  _________________________________   HISTORICAL  CHIEF COMPLAINT:  Chief Complaint  Patient presents with  . Follow-up    Patient reports that she is doing well.     HISTORY OF PRESENT ILLNESS:  Kathleen Schneider is a 77 year old woman with post herpetic neuralgia and insomnia  Update 11/11/2017: She feels her pain is more tolerable than ear;ier this year.   She has pain from PHN in the right lower abdomen/flank.   It bothers her most when something rubs against her or she takes a shower. .   The combination of he lamotrigine and lidoderm patches has greatly helped.     She did PT or her LBP and felt it helped her some.   She was doing hte exercises more a couple moths ago but has been more lax and is noting more pain.    She has facet hypertrophy at L4L5 on her MRI.     She wakes up several times a night due to urinary urgency.    She is now interested to try a medication.   Update 05/13/2017:  Her PHN is much better and she continues Lamictal and Lidoderm.   However, she is experiencing pain in the lower back and down the leg.   Her lower back pain worsened more the past couple months.    ASA helps the LBP pain some.    I had referred her for facet (medial branch) blocks and RFA due to facet hypertrophy/degeneration at L4L5 but insurance is insisting that she do PT first.     She notes her LBP is worse if standing a while and better if she sits or bends forward.    She is on meloxicam 7.5 mg but feels some the benefit is short lived (2-4 hours)  From 09/25/2016: PHN Dysesthesia:  She feels her PHN pain (in the right lower thoracic region) is doing bette on lamotrigine and Lidoderm.  She stopped gabapentin 300.  She is also on Lidoderm .  She has allodynia/hyperpathia triggered by clothes at times in that region.  This is also doing  better.  PHN / Dysesthesia history from initial visit:   In mid 2014, she had the onset of lower thoracic pain on the right.   She saw her PCP 2 weeks later and was told that she had also had evidence of a recent rash in that distribution.   Pain has been persistent.   She notes that touch feels like gait burning prickly pain.    Clothes rubbing against her lower abdomen/flank increases the pain.   Gabapentin helped but made her sleepy.     Lamotrigine was added with benefit.     LBP:   LBP is worsening and now occurring daily.   While resting LBP is mild but it can intensify if she stands a while or with walking.   Pain is axial without radiation into legs. She finds that she stoops more when she walks and using a shopping cart helps.    She has never had any imaging.   Meloxicam helps slightly  Insomnia:  She often has insomnia, especially if she takes a nap (which is common).    She also has nocturia x 3.   She often wakes up to urinate and has trouble falling back asleep once awake.  She has mixed urinary hesitancy and frequency  She has been told that she snores  She occasionally wakes up catching her breath  on rare occasions.     Her Epworth score is 16/24  (moderate sleepiness).   She is not interested in a PSG and would not wear CPAP or an oral appliance.  Mood:   She has noted mild depression but this has not worsened.     REVIEW OF SYSTEMS: Constitutional: No fevers, chills, sweats, or change in appetite.   EDS and insomnia Eyes: No visual changes, double vision, eye pain Ear, nose and throat: No hearing loss, ear pain, nasal congestion, sore throat Cardiovascular: No chest pain, palpitations Respiratory: No shortness of breath at rest or with exertion.   No wheezes.   She has snoring GastrointestinaI: No nausea, vomiting, diarrhea, abdominal pain, fecal incontinence Genitourinary: No dysuria, urinary retention or frequency.  No nocturia. Musculoskeletal: No neck pain, back  pain Integumentary: No rash, pruritus, skin lesions Neurological: as above Psychiatric: mild depression at this time.  No anxiety Endocrine: No palpitations, diaphoresis, change in appetite, change in weigh or increased thirst Hematologic/Lymphatic: No anemia, purpura, petechiae. Allergic/Immunologic: No itchy/runny eyes, nasal congestion, recent allergic reactions, rashes  ALLERGIES: No Known Allergies  HOME MEDICATIONS:  Current Outpatient Medications:  .  lamoTRIgine (LAMICTAL) 200 MG tablet, TAKE 1 TABLET BY MOUTH TWICE DAILY, Disp: 60 tablet, Rfl: 11 .  lidocaine (LIDODERM) 5 %, Place 2 patches onto the skin daily. Remove & Discard patch within 12 hours or as directed by MD, Disp: 60 patch, Rfl: 5 .  meloxicam (MOBIC) 7.5 MG tablet, Take 1 tablet (7.5 mg total) by mouth daily., Disp: 30 tablet, Rfl: 11  PAST MEDICAL HISTORY: Past Medical History:  Diagnosis Date  . Vision abnormalities     PAST SURGICAL HISTORY: Past Surgical History:  Procedure Laterality Date  . CHOLECYSTECTOMY, LAPAROSCOPIC      FAMILY HISTORY: Family History  Problem Relation Age of Onset  . Seizures Mother   . Alcoholism Father     SOCIAL HISTORY:  Social History   Socioeconomic History  . Marital status: Married    Spouse name: Not on file  . Number of children: Not on file  . Years of education: Not on file  . Highest education level: Not on file  Occupational History  . Not on file  Social Needs  . Financial resource strain: Not on file  . Food insecurity:    Worry: Not on file    Inability: Not on file  . Transportation needs:    Medical: Not on file    Non-medical: Not on file  Tobacco Use  . Smoking status: Former Research scientist (life sciences)  . Smokeless tobacco: Never Used  Substance and Sexual Activity  . Alcohol use: Yes    Alcohol/week: 0.0 oz    Comment: Rare--about twice per yr/fim  . Drug use: No  . Sexual activity: Not on file  Lifestyle  . Physical activity:    Days per week:  Not on file    Minutes per session: Not on file  . Stress: Not on file  Relationships  . Social connections:    Talks on phone: Not on file    Gets together: Not on file    Attends religious service: Not on file    Active member of club or organization: Not on file    Attends meetings of clubs or organizations: Not on file    Relationship status: Not on file  . Intimate partner violence:    Fear  of current or ex partner: Not on file    Emotionally abused: Not on file    Physically abused: Not on file    Forced sexual activity: Not on file  Other Topics Concern  . Not on file  Social History Narrative  . Not on file     PHYSICAL EXAM  Vitals:   11/11/17 1254  BP: 127/69  Pulse: 80  Weight: 215 lb (97.5 kg)  Height: 5\' 4"  (1.626 m)    Body mass index is 36.9 kg/m.   General: The patient is well-developed and well-nourished and in no acute distress   Skin: No rashes.  Musculoskeletal:  Back is mildly tender over paraspinal muscles  Neurologic Exam  Mental status: The patient is alert and oriented x 3 at the time of the examination. The patient has apparent normal recent and remote memory, with an apparently normal attention span and concentration ability.   Speech is normal.  Cranial nerves: Extraocular movements are full.   There is good facial sensation to soft touch bilaterally.Facial strength is normal.  Trapezius and sternocleidomastoid strength is normal. No dysarthria is noted.  The tongue is midline, and the patient has symmetric elevation of the soft palate. No obvious hearing deficits are noted.  Motor:  Muscle bulk is normal.   Tone is normal. Strength is  5 / 5 in all 4 extremities.   Sensory:  She has intact sensation to touch and vibration in the arms. She has mild hyperpathia and allodynia at the T11 and T12 dermatomes on the right.  Also, mild reduced left L5 sensation to touch  Gait and station: Station is normal.   Gait is normal. The tandem gait is  mildly wide... Romberg is negative.   Reflexes: Deep tendon reflexes are symmetric and normal bilaterally.         DIAGNOSTIC DATA (LABS, IMAGING, TESTING) - I reviewed patient records, labs, notes, testing and imaging myself where available.      ASSESSMENT AND PLAN   Postherpetic neuralgia  Dysesthesia  Midline low back pain without sciatica, unspecified chronicity  Facet joint syndrome  Urinary frequency   1.  Continue lamotrigine and Lidoderm patches for her postherpetic neuralgia..  2.  Increased meloxicam from 7.5-15 mg daily for her low back pain that is likely due to facet hypertrophy.  3.    If LBP worsens, consider a refresher PT course and/or referral for RFA of the medial branches to denervate L4L5  4.   Oxybutynin 5 mg once or twice a day 5.  She will return to see me in 12 months or sooner if there are new or worsening neurologic symptoms.    Richard A. Felecia Shelling, MD, PhD 0/94/0768, 0:88 PM Certified in Neurology, Clinical Neurophysiology, Sleep Medicine, Pain Medicine and Neuroimaging  [facility] [address] [place], [place] [address] [phone]

## 2018-10-01 DIAGNOSIS — Z1389 Encounter for screening for other disorder: Secondary | ICD-10-CM | POA: Diagnosis not present

## 2018-10-01 DIAGNOSIS — F321 Major depressive disorder, single episode, moderate: Secondary | ICD-10-CM | POA: Diagnosis not present

## 2018-10-22 ENCOUNTER — Telehealth: Payer: Self-pay | Admitting: Neurology

## 2018-10-22 MED ORDER — LIDOCAINE 5 % EX PTCH: 2.0000 | MEDICATED_PATCH | CUTANEOUS | 5 refills | Status: DC

## 2018-10-22 NOTE — Telephone Encounter (Signed)
Reviewed pt chart. Last seen 10/2017. Has yearly f/u 11/13/2018 with Dr. Felecia Shelling. Escribed refills to updated pharmacy. Updated pharmacy in [place].

## 2018-10-22 NOTE — Telephone Encounter (Signed)
Patient has changed her pharmacy from [facility] to [facility] [employer] [address] [employer] number  [place] [phone] .   Patient needs her lidocaine patches refilled  patient called into [facility] .

## 2018-10-22 NOTE — Addendum Note (Signed)
Addended by: Hope Pigeon on: 10/22/2018 12:54 PM   Modules accepted: Orders

## 2018-10-24 ENCOUNTER — Telehealth: Payer: Self-pay | Admitting: Neurology

## 2018-10-24 NOTE — Telephone Encounter (Signed)
Pt states that recently she transferred from [place] to [place] and her prescriptions were to be transferred over. Pt states her lidocaine (LIDODERM) 5 % was not transferred over and is wanting to know if the provider can write a new prescription for it and send it in to [facility]. Please advise.

## 2018-10-27 NOTE — Telephone Encounter (Signed)
Called LVM for pt letting her know to contact [facility] for refill. We sent in refill on 10/22/18 for her. Asked her to call back if she has further questions/concerns.

## 2018-10-27 NOTE — Telephone Encounter (Signed)
Rx was escribed to [facility] 10/22/18 as requested.

## 2018-10-28 DIAGNOSIS — F321 Major depressive disorder, single episode, moderate: Secondary | ICD-10-CM | POA: Diagnosis not present

## 2018-11-13 ENCOUNTER — Encounter: Payer: Self-pay | Admitting: Neurology

## 2018-11-13 ENCOUNTER — Other Ambulatory Visit: Payer: Self-pay

## 2018-11-13 ENCOUNTER — Ambulatory Visit (INDEPENDENT_AMBULATORY_CARE_PROVIDER_SITE_OTHER): Payer: PPO | Admitting: Neurology

## 2018-11-13 VITALS — BP 105/61 | HR 74 | Temp 98.6°F | Ht 64.0 in | Wt 194.0 lb

## 2018-11-13 DIAGNOSIS — M545 Low back pain, unspecified: Secondary | ICD-10-CM

## 2018-11-13 DIAGNOSIS — R208 Other disturbances of skin sensation: Secondary | ICD-10-CM

## 2018-11-13 DIAGNOSIS — M47899 Other spondylosis, site unspecified: Secondary | ICD-10-CM

## 2018-11-13 DIAGNOSIS — B0229 Other postherpetic nervous system involvement: Secondary | ICD-10-CM

## 2018-11-13 DIAGNOSIS — M47819 Spondylosis without myelopathy or radiculopathy, site unspecified: Secondary | ICD-10-CM

## 2018-11-13 MED ORDER — LAMOTRIGINE 150 MG PO TABS: 150.0000 mg | ORAL_TABLET | Freq: Two times a day (BID) | ORAL | 11 refills | Status: DC

## 2018-11-13 MED ORDER — MELOXICAM 7.5 MG PO TABS: 7.5000 mg | ORAL_TABLET | Freq: Every day | ORAL | 11 refills | Status: DC

## 2018-11-13 MED ORDER — SOLIFENACIN SUCCINATE 5 MG PO TABS: 5.0000 mg | ORAL_TABLET | Freq: Every day | ORAL | 11 refills | Status: DC

## 2018-11-13 MED ORDER — LIDOCAINE 5 % EX PTCH: 2.0000 | MEDICATED_PATCH | CUTANEOUS | 11 refills | Status: DC

## 2018-11-13 NOTE — Progress Notes (Signed)
[facility]  PATIENT: Kathleen Schneider DOB: [birth year]  REFERRING DOCTOR OR PCP:  Dr. Nancy Fetter SOURCE: patient and records from Dr. Nancy Fetter  _________________________________   HISTORICAL  CHIEF COMPLAINT:  Chief Complaint  Patient presents with   Follow-up    Room 12, alone. Postherpetic neuralgia    HISTORY OF PRESENT ILLNESS:  Kathleen Schneider is a 78 year old woman with post herpetic neuralgia and insomnia  Update 11/13/2018: She is reporting that the postherpetic nerve is doing well.  She is on lamotrigine 200 mg po bid and tolerates it well.    She also wears the Lidoderm patches with benefit.  She haas no skin changes.  Pain is in the right flank/abdomen.   She also reports LBP.   Meloxicam has helped.    She felt bette when she was doing PT and more exercises.    We discussed continuing the exercises.     She has facet hypertrophy at L4L5 on her MRI.     Her urinary urgency is better with oxybutynin and she has no recent incontinence.      Update 11/11/2017: She feels her pain is more tolerable than ear;ier this year.   She has pain from PHN in the right lower abdomen/flank.   It bothers her most when something rubs against her or she takes a shower. .   The combination of he lamotrigine and lidoderm patches has greatly helped.     She did PT or her LBP and felt it helped her some.   She was doing hte exercises more a couple moths ago but has been more lax and is noting more pain.    She wakes up several times a night due to urinary urgency.    She is now interested to try a medication.   Update 05/13/2017:  Her PHN is much better and she continues Lamictal and Lidoderm.   However, she is experiencing pain in the lower back and down the leg.   Her lower back pain worsened more the past couple months.    ASA helps the LBP pain some.    I had referred her for facet (medial branch) blocks and RFA due to facet hypertrophy/degeneration at L4L5 but insurance is  insisting that she do PT first.     She notes her LBP is worse if standing a while and better if she sits or bends forward.    She is on meloxicam 7.5 mg but feels some the benefit is short lived (2-4 hours)  From 09/25/2016: PHN Dysesthesia:  She feels her PHN pain (in the right lower thoracic region) is doing bette on lamotrigine and Lidoderm.  She stopped gabapentin 300.  She is also on Lidoderm .  She has allodynia/hyperpathia triggered by clothes at times in that region.  This is also doing better.  PHN / Dysesthesia history from initial visit:   In mid 2014, she had the onset of lower thoracic pain on the right.   She saw her PCP 2 weeks later and was told that she had also had evidence of a recent rash in that distribution.   Pain has been persistent.   She notes that touch feels like gait burning prickly pain.    Clothes rubbing against her lower abdomen/flank increases the pain.   Gabapentin helped but made her sleepy.     Lamotrigine was added with benefit.     LBP:   LBP is worsening and now occurring daily.   While resting LBP  is mild but it can intensify if she stands a while or with walking.   Pain is axial without radiation into legs. She finds that she stoops more when she walks and using a shopping cart helps.    She has never had any imaging.   Meloxicam helps slightly  Insomnia:  She often has insomnia, especially if she takes a nap (which is common).    She also has nocturia x 3.   She often wakes up to urinate and has trouble falling back asleep once awake.  She has mixed urinary hesitancy and frequency     She has been told that she snores  She occasionally wakes up catching her breath  on rare occasions.     Her Epworth score is 16/24  (moderate sleepiness).   She is not interested in a PSG and would not wear CPAP or an oral appliance.  Mood:   She has noted mild depression but this has not worsened.     REVIEW OF SYSTEMS: Constitutional: No fevers, chills, sweats, or change in  appetite.   EDS and insomnia Eyes: No visual changes, double vision, eye pain Ear, nose and throat: No hearing loss, ear pain, nasal congestion, sore throat Cardiovascular: No chest pain, palpitations Respiratory: No shortness of breath at rest or with exertion.   No wheezes.   She has snoring GastrointestinaI: No nausea, vomiting, diarrhea, abdominal pain, fecal incontinence Genitourinary: No dysuria, urinary retention or frequency.  No nocturia. Musculoskeletal: No neck pain, back pain Integumentary: No rash, pruritus, skin lesions Neurological: as above Psychiatric: mild depression at this time.  No anxiety Endocrine: No palpitations, diaphoresis, change in appetite, change in weigh or increased thirst Hematologic/Lymphatic: No anemia, purpura, petechiae. Allergic/Immunologic: No itchy/runny eyes, nasal congestion, recent allergic reactions, rashes  ALLERGIES: No Known Allergies  HOME MEDICATIONS:  Current Outpatient Medications:    lamoTRIgine (LAMICTAL) 200 MG tablet, TAKE 1 TABLET BY MOUTH TWICE DAILY, Disp: 60 tablet, Rfl: 11   lidocaine (LIDODERM) 5 %, Place 2 patches onto the skin daily. Remove & Discard patch within 12 hours or as directed by MD, Disp: 60 patch, Rfl: 5   meloxicam (MOBIC) 7.5 MG tablet, Take 1 tablet (7.5 mg total) by mouth daily., Disp: 30 tablet, Rfl: 11   oxybutynin (DITROPAN) 5 MG tablet, Take 1 tablet (5 mg total) by mouth 2 (two) times daily., Disp: 60 tablet, Rfl: 11  PAST MEDICAL HISTORY: Past Medical History:  Diagnosis Date   Vision abnormalities     PAST SURGICAL HISTORY: Past Surgical History:  Procedure Laterality Date   CHOLECYSTECTOMY, LAPAROSCOPIC      FAMILY HISTORY: Family History  Problem Relation Age of Onset   Seizures Mother    Alcoholism Father     SOCIAL HISTORY:  Social History   Socioeconomic History   Marital status: Married    Spouse name: Not on file   Number of children: Not on file   Years of  education: Not on file   Highest education level: Not on file  Occupational History   Not on file  Social Needs   Financial resource strain: Not on file   Food insecurity    Worry: Not on file    Inability: Not on file   Transportation needs    Medical: Not on file    Non-medical: Not on file  Tobacco Use   Smoking status: Former Smoker   Smokeless tobacco: Never Used  Substance and Sexual Activity   Alcohol use: Yes  Alcohol/week: 0.0 standard drinks    Comment: Rare--about twice per yr/fim   Drug use: No   Sexual activity: Not on file  Lifestyle   Physical activity    Days per week: Not on file    Minutes per session: Not on file   Stress: Not on file  Relationships   Social connections    Talks on phone: Not on file    Gets together: Not on file    Attends religious service: Not on file    Active member of club or organization: Not on file    Attends meetings of clubs or organizations: Not on file    Relationship status: Not on file   Intimate partner violence    Fear of current or ex partner: Not on file    Emotionally abused: Not on file    Physically abused: Not on file    Forced sexual activity: Not on file  Other Topics Concern   Not on file  Social History Narrative   Not on file     PHYSICAL EXAM  Vitals:   11/13/18 1244  BP: 105/61  Pulse: 74  Temp: 98.6 F (37 C)  Weight: 194 lb (88 kg)  Height: 5\' 4"  (1.626 m)    Body mass index is 33.3 kg/m.   General: The patient is well-developed and well-nourished and in no acute distress  Skin: No rashes.  Musculoskeletal:  Mildly tender over paraspinal muscles  Neurologic Exam  Mental status: The patient is alert and oriented x 3 at the time of the examination. The patient has apparent normal recent and remote memory, with an apparently normal attention span and concentration ability.   Speech is normal.  Cranial nerves: Extraocular movements are full.  Facial strength is  normal.   No obvious hearing deficits are noted.  Motor:  Muscle bulk is normal.   Tone is normal. Strength is  5 / 5 in all 4 extremities.   Sensory:  She has intact sensation to touch and vibration in the arms. minimal hyperpathia/allodynia at the T11 and T12 dermatomes on the right.  Also, mild reduced left L5 sensation to touch  Gait and station: Station is normal.   Gait is normal. The tandem gait is mildly wide... Romberg is negative.   Reflexes: Deep tendon reflexes are symmetric and normal bilaterally.         DIAGNOSTIC DATA (LABS, IMAGING, TESTING) - I reviewed patient records, labs, notes, testing and imaging myself where available.      ASSESSMENT AND PLAN     1. Postherpetic neuralgia   2. Dysesthesia   3. Facet joint syndrome   4. Midline low back pain without sciatica, unspecified chronicity     1.  Continue lamotrigine but reduced dose to 150 mg po bid (consider further reduction if pain continues to be minimal.  Continue Lidoderm patches 2.  Continue meloxicam 7.5 mg daily for low back pain that is likely due to facet hypertrophy.  3.    If LBP worsens, consider additional PT and/or referral for RFA of the medial branches to denervate L4L5  4.  Change oxybutynin to vesicare as may have less dry mouth 5.  She will return to see me in 12 months or sooner if there are new or worsening neurologic symptoms.    Ramadan Couey A. Felecia Shelling, MD, PhD 2/35/3614, 4:31 PM Certified in Neurology, Clinical Neurophysiology, Sleep Medicine, Pain Medicine and Neuroimaging  [facility] [address] [place], [place]  [address] °[phone] ° °

## 2018-12-04 ENCOUNTER — Other Ambulatory Visit: Payer: Self-pay | Admitting: Neurology

## 2018-12-08 ENCOUNTER — Telehealth: Payer: Self-pay | Admitting: Neurology

## 2018-12-08 MED ORDER — OXYBUTYNIN CHLORIDE 5 MG PO TABS: 5.0000 mg | ORAL_TABLET | Freq: Two times a day (BID) | ORAL | 11 refills | Status: DC

## 2018-12-08 NOTE — Telephone Encounter (Signed)
Called pt. She is agreeable to go back to oxybutynin. I escribed refill for this and asked that pharmacy d/t vesicare on file.

## 2018-12-08 NOTE — Telephone Encounter (Signed)
Lets go back to oxybutynin 5 mg bid #60  #11

## 2018-12-08 NOTE — Telephone Encounter (Signed)
Dr. Sater- please advise 

## 2018-12-08 NOTE — Telephone Encounter (Signed)
Pt called and stated that she is normally on oxybutynin (DITROPAN) 5 MG tablet which it comes out to $5 but when she went to the pharmacy to pick up her refill they gave her solifenacin (VESICARE) 5 MG tablet which it turned out to $90. She would like to know if this is a mistake and/or if she can go back to the oxybutynin (DITROPAN) 5 MG tablet which is more affordable for her. Please advise.

## 2018-12-16 DIAGNOSIS — N3281 Overactive bladder: Secondary | ICD-10-CM | POA: Diagnosis not present

## 2018-12-16 DIAGNOSIS — L659 Nonscarring hair loss, unspecified: Secondary | ICD-10-CM | POA: Diagnosis not present

## 2018-12-16 DIAGNOSIS — N183 Chronic kidney disease, stage 3 (moderate): Secondary | ICD-10-CM | POA: Diagnosis not present

## 2018-12-16 DIAGNOSIS — M8588 Other specified disorders of bone density and structure, other site: Secondary | ICD-10-CM | POA: Diagnosis not present

## 2018-12-16 DIAGNOSIS — B0229 Other postherpetic nervous system involvement: Secondary | ICD-10-CM | POA: Diagnosis not present

## 2018-12-16 DIAGNOSIS — F321 Major depressive disorder, single episode, moderate: Secondary | ICD-10-CM | POA: Diagnosis not present

## 2018-12-16 DIAGNOSIS — Z0000 Encounter for general adult medical examination without abnormal findings: Secondary | ICD-10-CM | POA: Diagnosis not present

## 2018-12-16 DIAGNOSIS — Z13220 Encounter for screening for lipoid disorders: Secondary | ICD-10-CM | POA: Diagnosis not present

## 2018-12-17 DIAGNOSIS — Z136 Encounter for screening for cardiovascular disorders: Secondary | ICD-10-CM | POA: Diagnosis not present

## 2018-12-17 DIAGNOSIS — N183 Chronic kidney disease, stage 3 (moderate): Secondary | ICD-10-CM | POA: Diagnosis not present

## 2018-12-17 DIAGNOSIS — L659 Nonscarring hair loss, unspecified: Secondary | ICD-10-CM | POA: Diagnosis not present

## 2019-01-07 DIAGNOSIS — N183 Chronic kidney disease, stage 3 (moderate): Secondary | ICD-10-CM | POA: Diagnosis not present

## 2019-04-23 ENCOUNTER — Telehealth: Payer: Self-pay | Admitting: *Deleted

## 2019-04-23 NOTE — Telephone Encounter (Addendum)
PA approved. PA Case: [id number], Status: Approved, Coverage Starts on: 04/23/2019 12:00:00 AM, Coverage Ends on: 04/22/2020 12:00:00 AM.  Faxed notice of approval to [facility] at [phone]. Received fax confirmation.

## 2019-04-23 NOTE — Telephone Encounter (Signed)
Submitted PA lidocaine patch on CMM. [id number]. Waiting on determination from [facility].

## 2019-09-08 ENCOUNTER — Other Ambulatory Visit: Payer: Self-pay | Admitting: Neurology

## 2019-11-10 ENCOUNTER — Other Ambulatory Visit: Payer: Self-pay | Admitting: Neurology

## 2019-11-18 ENCOUNTER — Encounter: Payer: Self-pay | Admitting: Family Medicine

## 2019-11-18 ENCOUNTER — Ambulatory Visit: Payer: PPO | Admitting: Family Medicine

## 2019-11-18 NOTE — Progress Notes (Deleted)
PATIENT: Kathleen Schneider DOB: 05-10-1940  REASON FOR VISIT: follow up HISTORY FROM: patient  No chief complaint on file.    HISTORY OF PRESENT ILLNESS: Today 11/18/19 Kathleen Schneider is a 79 y.o. female here today for follow up for post herpetic neuralgia, low back pain, urinary frequency and insomnia. She continues lamotrigine 150mg  BID. meloxicam 7.5mg  daily Lidoderm patches also help with pain.  She continues oxybutynin for urinary frequency.   HISTORY: (copied from Dr Garth Bigness note on 11/13/2018)  Kathleen Schneider is a 79 year old woman with post herpetic neuralgia and insomnia  Update 11/13/2018: She is reporting that the postherpetic nerve is doing well.  She is on lamotrigine 200 mg po bid and tolerates it well.    She also wears the Lidoderm patches with benefit.  She haas no skin changes.  Pain is in the right flank/abdomen.   She also reports LBP.   Meloxicam has helped.    She felt bette when she was doing PT and more exercises.    We discussed continuing the exercises.     She has facet hypertrophy at L4L5 on her MRI.     Her urinary urgency is better with oxybutynin and she has no recent incontinence.      Update 11/11/2017: She feels her pain is more tolerable than ear;ier this year.   She has pain from PHN in the right lower abdomen/flank.   It bothers her most when something rubs against her or she takes a shower. .   The combination of he lamotrigine and lidoderm patches has greatly helped.     She did PT or her LBP and felt it helped her some.   She was doing hte exercises more a couple moths ago but has been more lax and is noting more pain.    She wakes up several times a night due to urinary urgency.    She is now interested to try a medication.   Update 05/13/2017:  Her PHN is much better and she continues Lamictal and Lidoderm.   However, she is experiencing pain in the lower back and down the leg.   Her lower back pain worsened more the past couple  months.    ASA helps the LBP pain some.    I had referred her for facet (medial branch) blocks and RFA due to facet hypertrophy/degeneration at L4L5 but insurance is insisting that she do PT first.     She notes her LBP is worse if standing a while and better if she sits or bends forward.    She is on meloxicam 7.5 mg but feels some the benefit is short lived (2-4 hours)  From 09/25/2016: PHN Dysesthesia:  She feels her PHN pain (in the right lower thoracic region) is doing bette on lamotrigine and Lidoderm.  She stopped gabapentin 300.  She is also on Lidoderm .  She has allodynia/hyperpathia triggered by clothes at times in that region.  This is also doing better.  PHN / Dysesthesia history from initial visit:   In mid 2014, she had the onset of lower thoracic pain on the right.   She saw her PCP 2 weeks later and was told that she had also had evidence of a recent rash in that distribution.   Pain has been persistent.   She notes that touch feels like gait burning prickly pain.    Clothes rubbing against her lower abdomen/flank increases the pain.   Gabapentin helped but made her sleepy.  Lamotrigine was added with benefit.     LBP:   LBP is worsening and now occurring daily.   While resting LBP is mild but it can intensify if she stands a while or with walking.   Pain is axial without radiation into legs. She finds that she stoops more when she walks and using a shopping cart helps.    She has never had any imaging.   Meloxicam helps slightly  Insomnia:  She often has insomnia, especially if she takes a nap (which is common).    She also has nocturia x 3.   She often wakes up to urinate and has trouble falling back asleep once awake.  She has mixed urinary hesitancy and frequency     She has been told that she snores  She occasionally wakes up catching her breath  on rare occasions.     Her Epworth score is 16/24  (moderate sleepiness).   She is not interested in a PSG and would not wear CPAP  or an oral appliance.  Mood:   She has noted mild depression but this has not worsened.      REVIEW OF SYSTEMS: Out of a complete 14 system review of symptoms, the patient complains only of the following symptoms, and all other reviewed systems are negative.  ALLERGIES: No Known Allergies  HOME MEDICATIONS: Outpatient Medications Prior to Visit  Medication Sig Dispense Refill  . lamoTRIgine (LAMICTAL) 150 MG tablet TAKE 1 TABLET BY MOUTH TWICE A DAY 60 tablet 0  . lidocaine (LIDODERM) 5 % Place 2 patches onto the skin daily. Remove & Discard patch within 12 hours or as directed by MD 60 patch 11  . meloxicam (MOBIC) 7.5 MG tablet Take 1 tablet (7.5 mg total) by mouth daily. 30 tablet 11  . oxybutynin (DITROPAN) 5 MG tablet Take 1 tablet (5 mg total) by mouth 2 (two) times daily. 60 tablet 11   No facility-administered medications prior to visit.    PAST MEDICAL HISTORY: Past Medical History:  Diagnosis Date  . Vision abnormalities     PAST SURGICAL HISTORY: Past Surgical History:  Procedure Laterality Date  . CHOLECYSTECTOMY, LAPAROSCOPIC      FAMILY HISTORY: Family History  Problem Relation Age of Onset  . Seizures Mother   . Alcoholism Father     SOCIAL HISTORY: Social History   Socioeconomic History  . Marital status: Married    Spouse name: Not on file  . Number of children: Not on file  . Years of education: Not on file  . Highest education level: Not on file  Occupational History  . Not on file  Tobacco Use  . Smoking status: Former Research scientist (life sciences)  . Smokeless tobacco: Never Used  Substance and Sexual Activity  . Alcohol use: Yes    Alcohol/week: 0.0 standard drinks    Comment: Rare--about twice per yr/fim  . Drug use: No  . Sexual activity: Not on file  Other Topics Concern  . Not on file  Social History Narrative  . Not on file   Social Determinants of Health   Financial Resource Strain:   . Difficulty of Paying Living Expenses:   Food Insecurity:    . Worried About Charity fundraiser in the Last Year:   . Arboriculturist in the Last Year:   Transportation Needs:   . Film/video editor (Medical):   Marland Kitchen Lack of Transportation (Non-Medical):   Physical Activity:   . Days of Exercise per  Week:   . Minutes of Exercise per Session:   Stress:   . Feeling of Stress :   Social Connections:   . Frequency of Communication with Friends and Family:   . Frequency of Social Gatherings with Friends and Family:   . Attends Religious Services:   . Active Member of Clubs or Organizations:   . Attends Archivist Meetings:   Marland Kitchen Marital Status:   Intimate Partner Violence:   . Fear of Current or Ex-Partner:   . Emotionally Abused:   Marland Kitchen Physically Abused:   . Sexually Abused:       PHYSICAL EXAM  There were no vitals filed for this visit. There is no height or weight on file to calculate BMI.  Generalized: Well developed, in no acute distress  Cardiology: normal rate and rhythm, no murmur noted Respiratory: clear to auscultation bilaterally  Neurological examination  Mentation: Alert oriented to time, place, history taking. Follows all commands speech and language fluent Cranial nerve II-XII: Pupils were equal round reactive to light. Extraocular movements were full, visual field were full on confrontational test. Facial sensation and strength were normal. Uvula tongue midline. Head turning and shoulder shrug  were normal and symmetric. Motor: The motor testing reveals 5 over 5 strength of all 4 extremities. Good symmetric motor tone is noted throughout.  Sensory: Sensory testing is intact to soft touch on all 4 extremities. No evidence of extinction is noted.  Coordination: Cerebellar testing reveals good finger-nose-finger and heel-to-shin bilaterally.  Gait and station: Gait is normal. Tandem gait is normal. Romberg is negative. No drift is seen.  Reflexes: Deep tendon reflexes are symmetric and normal bilaterally.    DIAGNOSTIC DATA (LABS, IMAGING, TESTING) - I reviewed patient records, labs, notes, testing and imaging myself where available.  No flowsheet data found.   No results found for: WBC, HGB, HCT, MCV, PLT No results found for: NA, K, CL, CO2, GLUCOSE, BUN, CREATININE, CALCIUM, PROT, ALBUMIN, AST, ALT, ALKPHOS, BILITOT, GFRNONAA, GFRAA No results found for: CHOL, HDL, LDLCALC, LDLDIRECT, TRIG, CHOLHDL No results found for: HGBA1C No results found for: VITAMINB12 No results found for: TSH     ASSESSMENT AND PLAN 79 y.o. year old female  has a past medical history of Vision abnormalities. here with ***    ICD-10-CM   1. Postherpetic neuralgia  B02.29   2. Insomnia, unspecified type  G47.00   3. Midline low back pain without sciatica, unspecified chronicity  M54.5   4. Urinary frequency  R35.0        No orders of the defined types were placed in this encounter.    No orders of the defined types were placed in this encounter.     I spent 15 minutes with the patient. 50% of this time was spent counseling and educating patient on plan of care and medications.    Debbora Presto, FNP-C 11/18/2019, 11:32 AM [facility] [address] [place],  [address] [phone]

## 2019-11-19 ENCOUNTER — Telehealth: Payer: Self-pay | Admitting: Family Medicine

## 2019-11-19 NOTE — Telephone Encounter (Signed)
Pt called to explained why she missed her appt yesterday. Pt said had the wrong address and got lost would like nurse to know why.

## 2019-11-22 ENCOUNTER — Other Ambulatory Visit: Payer: Self-pay | Admitting: Neurology

## 2019-11-23 NOTE — Telephone Encounter (Signed)
I called LMVM for pt returning call. I see she has rescheduled.  No reason to call back unless she feels like she needs to.

## 2019-12-04 ENCOUNTER — Other Ambulatory Visit: Payer: Self-pay | Admitting: Neurology

## 2019-12-09 ENCOUNTER — Inpatient Hospital Stay (HOSPITAL_COMMUNITY)
Admission: EM | Admit: 2019-12-09 | Discharge: 2019-12-11 | DRG: 565 | Disposition: A | Discharge status: Skilled Nursing Facility | Payer: PPO | Attending: Internal Medicine | Admitting: Internal Medicine

## 2019-12-09 ENCOUNTER — Other Ambulatory Visit: Payer: Self-pay

## 2019-12-09 ENCOUNTER — Encounter (HOSPITAL_COMMUNITY): Payer: Self-pay

## 2019-12-09 ENCOUNTER — Emergency Department (HOSPITAL_COMMUNITY): Payer: PPO

## 2019-12-09 ENCOUNTER — Inpatient Hospital Stay (HOSPITAL_COMMUNITY): Payer: PPO

## 2019-12-09 DIAGNOSIS — D649 Anemia, unspecified: Secondary | ICD-10-CM | POA: Diagnosis present

## 2019-12-09 DIAGNOSIS — R41841 Cognitive communication deficit: Secondary | ICD-10-CM | POA: Diagnosis not present

## 2019-12-09 DIAGNOSIS — G4700 Insomnia, unspecified: Secondary | ICD-10-CM | POA: Diagnosis present

## 2019-12-09 DIAGNOSIS — E860 Dehydration: Secondary | ICD-10-CM

## 2019-12-09 DIAGNOSIS — R531 Weakness: Secondary | ICD-10-CM | POA: Diagnosis not present

## 2019-12-09 DIAGNOSIS — W19XXXA Unspecified fall, initial encounter: Secondary | ICD-10-CM

## 2019-12-09 DIAGNOSIS — T796XXA Traumatic ischemia of muscle, initial encounter: Secondary | ICD-10-CM | POA: Diagnosis not present

## 2019-12-09 DIAGNOSIS — S8001XA Contusion of right knee, initial encounter: Secondary | ICD-10-CM | POA: Diagnosis present

## 2019-12-09 DIAGNOSIS — Y92009 Unspecified place in unspecified non-institutional (private) residence as the place of occurrence of the external cause: Secondary | ICD-10-CM | POA: Diagnosis not present

## 2019-12-09 DIAGNOSIS — Z20822 Contact with and (suspected) exposure to covid-19: Secondary | ICD-10-CM | POA: Diagnosis not present

## 2019-12-09 DIAGNOSIS — M25562 Pain in left knee: Secondary | ICD-10-CM

## 2019-12-09 DIAGNOSIS — M170 Bilateral primary osteoarthritis of knee: Secondary | ICD-10-CM | POA: Diagnosis present

## 2019-12-09 DIAGNOSIS — M6282 Rhabdomyolysis: Secondary | ICD-10-CM | POA: Diagnosis not present

## 2019-12-09 DIAGNOSIS — R5381 Other malaise: Secondary | ICD-10-CM | POA: Diagnosis not present

## 2019-12-09 DIAGNOSIS — S50312A Abrasion of left elbow, initial encounter: Secondary | ICD-10-CM | POA: Diagnosis present

## 2019-12-09 DIAGNOSIS — S80212A Abrasion, left knee, initial encounter: Secondary | ICD-10-CM | POA: Diagnosis present

## 2019-12-09 DIAGNOSIS — N1330 Unspecified hydronephrosis: Secondary | ICD-10-CM | POA: Diagnosis not present

## 2019-12-09 DIAGNOSIS — N3001 Acute cystitis with hematuria: Secondary | ICD-10-CM | POA: Diagnosis not present

## 2019-12-09 DIAGNOSIS — Z87891 Personal history of nicotine dependence: Secondary | ICD-10-CM

## 2019-12-09 DIAGNOSIS — N179 Acute kidney failure, unspecified: Secondary | ICD-10-CM | POA: Diagnosis not present

## 2019-12-09 DIAGNOSIS — F329 Major depressive disorder, single episode, unspecified: Secondary | ICD-10-CM | POA: Diagnosis present

## 2019-12-09 DIAGNOSIS — W1830XA Fall on same level, unspecified, initial encounter: Secondary | ICD-10-CM | POA: Diagnosis present

## 2019-12-09 DIAGNOSIS — R404 Transient alteration of awareness: Secondary | ICD-10-CM | POA: Diagnosis not present

## 2019-12-09 DIAGNOSIS — Z743 Need for continuous supervision: Secondary | ICD-10-CM | POA: Diagnosis not present

## 2019-12-09 DIAGNOSIS — R0902 Hypoxemia: Secondary | ICD-10-CM | POA: Diagnosis not present

## 2019-12-09 DIAGNOSIS — R279 Unspecified lack of coordination: Secondary | ICD-10-CM | POA: Diagnosis not present

## 2019-12-09 DIAGNOSIS — S50311A Abrasion of right elbow, initial encounter: Secondary | ICD-10-CM | POA: Diagnosis not present

## 2019-12-09 DIAGNOSIS — S8002XA Contusion of left knee, initial encounter: Secondary | ICD-10-CM | POA: Diagnosis not present

## 2019-12-09 DIAGNOSIS — R278 Other lack of coordination: Secondary | ICD-10-CM | POA: Diagnosis not present

## 2019-12-09 DIAGNOSIS — N390 Urinary tract infection, site not specified: Secondary | ICD-10-CM

## 2019-12-09 DIAGNOSIS — M25561 Pain in right knee: Secondary | ICD-10-CM

## 2019-12-09 DIAGNOSIS — N184 Chronic kidney disease, stage 4 (severe): Secondary | ICD-10-CM | POA: Diagnosis not present

## 2019-12-09 DIAGNOSIS — R296 Repeated falls: Secondary | ICD-10-CM | POA: Diagnosis not present

## 2019-12-09 DIAGNOSIS — M792 Neuralgia and neuritis, unspecified: Secondary | ICD-10-CM | POA: Diagnosis present

## 2019-12-09 DIAGNOSIS — R54 Age-related physical debility: Secondary | ICD-10-CM

## 2019-12-09 DIAGNOSIS — S8991XA Unspecified injury of right lower leg, initial encounter: Secondary | ICD-10-CM | POA: Diagnosis not present

## 2019-12-09 DIAGNOSIS — I7090 Unspecified atherosclerosis: Secondary | ICD-10-CM | POA: Diagnosis not present

## 2019-12-09 DIAGNOSIS — S80211A Abrasion, right knee, initial encounter: Secondary | ICD-10-CM | POA: Diagnosis present

## 2019-12-09 DIAGNOSIS — Z79899 Other long term (current) drug therapy: Secondary | ICD-10-CM | POA: Diagnosis not present

## 2019-12-09 DIAGNOSIS — Z791 Long term (current) use of non-steroidal anti-inflammatories (NSAID): Secondary | ICD-10-CM | POA: Diagnosis not present

## 2019-12-09 DIAGNOSIS — B9620 Unspecified Escherichia coli [E. coli] as the cause of diseases classified elsewhere: Secondary | ICD-10-CM | POA: Diagnosis present

## 2019-12-09 DIAGNOSIS — R2689 Other abnormalities of gait and mobility: Secondary | ICD-10-CM | POA: Diagnosis not present

## 2019-12-09 LAB — COMPREHENSIVE METABOLIC PANEL
ALT: 42 U/L (ref 0–44)
AST: 63 U/L — ABNORMAL HIGH (ref 15–41)
Albumin: 3.3 g/dL — ABNORMAL LOW (ref 3.5–5.0)
Alkaline Phosphatase: 72 U/L (ref 38–126)
Anion gap: 15 (ref 5–15)
BUN: 57 mg/dL — ABNORMAL HIGH (ref 8–23)
CO2: 21 mmol/L — ABNORMAL LOW (ref 22–32)
Calcium: 9.3 mg/dL (ref 8.9–10.3)
Chloride: 109 mmol/L (ref 98–111)
Creatinine, Ser: 2.3 mg/dL — ABNORMAL HIGH (ref 0.44–1.00)
GFR calc Af Amer: 23 mL/min — ABNORMAL LOW (ref >60)
GFR calc non Af Amer: 19 mL/min — ABNORMAL LOW (ref >60)
Glucose, Bld: 112 mg/dL — ABNORMAL HIGH (ref 70–99)
Potassium: 4.9 mmol/L (ref 3.5–5.1)
Sodium: 145 mmol/L (ref 135–145)
Total Bilirubin: 1.2 mg/dL (ref 0.3–1.2)
Total Protein: 8.2 g/dL — ABNORMAL HIGH (ref 6.5–8.1)

## 2019-12-09 LAB — CBC WITH DIFFERENTIAL/PLATELET
Abs Immature Granulocytes: 0.26 10*3/uL — ABNORMAL HIGH (ref 0.00–0.07)
Basophils Absolute: 0.1 10*3/uL (ref 0.0–0.1)
Basophils Relative: 1 %
Eosinophils Absolute: 0 10*3/uL (ref 0.0–0.5)
Eosinophils Relative: 0 %
HCT: 36.4 % (ref 36.0–46.0)
Hemoglobin: 11 g/dL — ABNORMAL LOW (ref 12.0–15.0)
Immature Granulocytes: 2 %
Lymphocytes Relative: 11 %
Lymphs Abs: 1.3 10*3/uL (ref 0.7–4.0)
MCH: 27.2 pg (ref 26.0–34.0)
MCHC: 30.2 g/dL (ref 30.0–36.0)
MCV: 90.1 fL (ref 80.0–100.0)
Monocytes Absolute: 0.6 10*3/uL (ref 0.1–1.0)
Monocytes Relative: 5 %
Neutro Abs: 9.5 10*3/uL — ABNORMAL HIGH (ref 1.7–7.7)
Neutrophils Relative %: 81 %
Platelets: 510 10*3/uL — ABNORMAL HIGH (ref 150–400)
RBC: 4.04 MIL/uL (ref 3.87–5.11)
RDW: 16.4 % — ABNORMAL HIGH (ref 11.5–15.5)
WBC: 11.7 10*3/uL — ABNORMAL HIGH (ref 4.0–10.5)
nRBC: 0 % (ref 0.0–0.2)

## 2019-12-09 LAB — URINALYSIS, ROUTINE W REFLEX MICROSCOPIC
Bilirubin Urine: NEGATIVE
Glucose, UA: NEGATIVE mg/dL
Ketones, ur: 5 mg/dL — AB
Nitrite: POSITIVE — AB
Protein, ur: 30 mg/dL — AB
Specific Gravity, Urine: 1.025 (ref 1.005–1.030)
pH: 5 (ref 5.0–8.0)

## 2019-12-09 LAB — SARS CORONAVIRUS 2 BY RT PCR (HOSPITAL ORDER, PERFORMED IN ~~LOC~~ HOSPITAL LAB): SARS Coronavirus 2: NEGATIVE

## 2019-12-09 LAB — CK: Total CK: 1033 U/L — ABNORMAL HIGH (ref 38–234)

## 2019-12-09 MED ORDER — OXYBUTYNIN CHLORIDE 5 MG PO TABS
5.0000 mg | ORAL_TABLET | Freq: Two times a day (BID) | ORAL | Status: DC
  Administered 2019-12-09 – 2019-12-11 (×5): 5 mg via ORAL
  Filled 2019-12-09 (×5): qty 1

## 2019-12-09 MED ORDER — ACETAMINOPHEN 650 MG RE SUPP: 650.0000 mg | Freq: Four times a day (QID) | RECTAL | Status: DC | PRN

## 2019-12-09 MED ORDER — LAMOTRIGINE 25 MG PO TABS
150.0000 mg | ORAL_TABLET | Freq: Two times a day (BID) | ORAL | Status: DC
  Administered 2019-12-09 – 2019-12-11 (×5): 150 mg via ORAL
  Filled 2019-12-09 (×5): qty 1

## 2019-12-09 MED ORDER — ESCITALOPRAM OXALATE 10 MG PO TABS
5.0000 mg | ORAL_TABLET | ORAL | Status: DC
  Administered 2019-12-09 – 2019-12-11 (×2): 5 mg via ORAL
  Filled 2019-12-09 (×2): qty 1

## 2019-12-09 MED ORDER — SODIUM CHLORIDE 0.9 % IV SOLN
INTRAVENOUS | Status: DC
  Administered 2019-12-09: 1000 mL via INTRAVENOUS

## 2019-12-09 MED ORDER — HYDROCODONE-ACETAMINOPHEN 5-325 MG PO TABS: 1.0000 | ORAL_TABLET | ORAL | Status: DC | PRN

## 2019-12-09 MED ORDER — SODIUM CHLORIDE 0.9 % IV SOLN
1000.0000 mL | INTRAVENOUS | Status: DC
  Administered 2019-12-09: 1000 mL via INTRAVENOUS

## 2019-12-09 MED ORDER — FENTANYL CITRATE (PF) 100 MCG/2ML IJ SOLN
50.0000 ug | Freq: Once | INTRAMUSCULAR | Status: AC
  Administered 2019-12-09: 50 ug via INTRAVENOUS
  Filled 2019-12-09: qty 2

## 2019-12-09 MED ORDER — SODIUM CHLORIDE 0.9 % IV SOLN
2.0000 g | Freq: Once | INTRAVENOUS | Status: AC
  Administered 2019-12-09: 2 g via INTRAVENOUS
  Filled 2019-12-09: qty 20

## 2019-12-09 MED ORDER — ACETAMINOPHEN 325 MG PO TABS: 650.0000 mg | ORAL_TABLET | Freq: Four times a day (QID) | ORAL | Status: DC | PRN

## 2019-12-09 MED ORDER — ENOXAPARIN SODIUM 30 MG/0.3ML ~~LOC~~ SOLN
30.0000 mg | SUBCUTANEOUS | Status: DC
  Administered 2019-12-09 – 2019-12-11 (×3): 30 mg via SUBCUTANEOUS
  Filled 2019-12-09 (×3): qty 0.3

## 2019-12-09 MED ORDER — SODIUM CHLORIDE 0.9 % IV SOLN
1.0000 g | INTRAVENOUS | Status: DC
  Administered 2019-12-10 – 2019-12-11 (×2): 1 g via INTRAVENOUS
  Filled 2019-12-09 (×2): qty 1

## 2019-12-09 MED ORDER — SODIUM CHLORIDE 0.9 % IV BOLUS (SEPSIS)
1000.0000 mL | Freq: Once | INTRAVENOUS | Status: AC
  Administered 2019-12-09: 1000 mL via INTRAVENOUS

## 2019-12-09 MED ORDER — SODIUM CHLORIDE 0.9 % IV BOLUS
1000.0000 mL | Freq: Once | INTRAVENOUS | Status: AC
  Administered 2019-12-09: 1000 mL via INTRAVENOUS

## 2019-12-09 NOTE — Progress Notes (Signed)
Called the ED 4x, no answer during this time.  Will await for report.

## 2019-12-09 NOTE — H&P (Signed)
History and Physical    Kathleen Schneider DJM:[id number] DOB: 1940/07/11 DOA: 12/09/2019  PCP: Donald Prose, MD  Patient coming from: Home  Chief Complaint: Fall  HPI: Kathleen Schneider is a 79 y.o. female with medical history significant of CKD3, incontinence currently taking oxybutynin who presents to the emergency department via EMS with progressive weakness following a fall on 8/22.  Patient reports she was getting ready to celebrate her birthday as she was dressing following the shower, the patient fell with no reported loss of consciousness.  Following the fall, patient reported much difficulty getting back up again and subsequently had to cancel her birthday celebration.  Over the next several days, patient was unable to get back on her feet and crawled around on her elbows and knees in order to move around the house.  Patient was afraid to call for help but subsequently, her weakness was so severe, patient ultimately called EMS on day of hospital presentation.  ED Course: In the emergency department, patient noted to have a creatinine of 2.30 with CK of over 1000.  Patient did complain of bilateral knee pain thus bilateral knee x-rays were obtained, findings were negative.  Notable for large blood, large leukocytes, positive nitrites, many bacteria, 21-50 WBCs.  Patient was given 1 dose of Rocephin.  Given concerns of acute renal failure and rhabdomyolysis with UTI, hospital service consulted for consideration for medical admission.  Review of Systems:  Review of Systems  Constitutional: Positive for malaise/fatigue. Negative for chills and fever.  HENT: Negative for congestion, ear pain, nosebleeds and tinnitus.   Eyes: Negative for double vision, photophobia and pain.  Respiratory: Negative for hemoptysis, sputum production and shortness of breath.   Cardiovascular: Negative for chest pain, palpitations and leg swelling.  Gastrointestinal: Negative for abdominal pain, nausea and vomiting.    Genitourinary: Negative for dysuria and urgency.  Musculoskeletal: Positive for falls, joint pain and myalgias.  Neurological: Negative for tingling, tremors, seizures, loss of consciousness and weakness.  Psychiatric/Behavioral: Negative for hallucinations and memory loss. The patient is not nervous/anxious.     Past Medical History:  Diagnosis Date  . Vision abnormalities     Past Surgical History:  Procedure Laterality Date  . CHOLECYSTECTOMY, LAPAROSCOPIC       reports that she has quit smoking. She has never used smokeless tobacco. She reports current alcohol use. She reports that she does not use drugs.  No Known Allergies  Family History  Problem Relation Age of Onset  . Seizures Mother   . Alcoholism Father     Prior to Admission medications   Medication Sig Start Date End Date Taking? Authorizing Provider  escitalopram (LEXAPRO) 5 MG tablet Take 5 mg by mouth See admin instructions. Monday, Wednesday and friday 11/27/19  Yes [provider]  lamoTRIgine (LAMICTAL) 150 MG tablet TAKE 1 TABLET BY MOUTH TWICE A DAY Patient taking differently: Take 150 mg by mouth 2 (two) times daily.  12/07/19  Yes Sater, Nanine Means, MD  lidocaine (LIDODERM) 5 % Place 2 patches onto the skin daily. Remove & Discard patch within 12 hours or as directed by MD 11/13/18  Yes Sater, Nanine Means, MD  oxybutynin (DITROPAN) 5 MG tablet TAKE 1 TABLET BY MOUTH TWICE A DAY Patient taking differently: Take 5 mg by mouth 2 (two) times daily.  11/23/19  Yes Sater, Nanine Means, MD  meloxicam (MOBIC) 7.5 MG tablet Take 1 tablet (7.5 mg total) by mouth daily. Patient not taking: Reported on 12/09/2019 11/13/18  Britt Bottom, MD    Physical Exam: Vitals:   12/09/19 0515 12/09/19 0530 12/09/19 0630 12/09/19 0730  BP: (!) 148/76 (!) 150/75 (!) 155/77 140/88  Pulse: 83 82 84 83  Resp: 17 (!) 23 (!) 21 (!) 21  Temp:      TempSrc:      SpO2: 97% 92% 97% 97%  Weight:      Height:         Constitutional: NAD, calm, comfortable Vitals:   12/09/19 0515 12/09/19 0530 12/09/19 0630 12/09/19 0730  BP: (!) 148/76 (!) 150/75 (!) 155/77 140/88  Pulse: 83 82 84 83  Resp: 17 (!) 23 (!) 21 (!) 21  Temp:      TempSrc:      SpO2: 97% 92% 97% 97%  Weight:      Height:       Eyes: PERRL, lids and conjunctivae normal ENMT: Mucous membranes are moist. Posterior pharynx clear of any exudate or lesions.Normal dentition.  Neck: normal, supple, trachea midline Respiratory: clear to auscultation bilaterally, no wheezing, no crackles. Normal respiratory effort. No accessory muscle use.  Cardiovascular: Regular rate and rhythm, S1, S2 Abdomen: no tenderness, no masses palpated. No hepatosplenomegaly. Bowel sounds positive.  Musculoskeletal: no clubbing / cyanosis. No joint deformity upper and lower extremities.  Bilateral knee pain Skin: no rashes, lesions, ulcers. No induration Neurologic: CN 2-12 grossly intact. Sensation intact. Strength 5/5 in all 4.  Psychiatric: Normal judgment and insight. Alert and oriented x 3. Normal mood.    Labs on Admission: I have personally reviewed following labs and imaging studies  CBC: Recent Labs  Lab 12/09/19 0456  WBC 11.7*  NEUTROABS 9.5*  HGB 11.0*  HCT 36.4  MCV 90.1  PLT 419*   Basic Metabolic Panel: Recent Labs  Lab 12/09/19 0456  NA 145  K 4.9  CL 109  CO2 21*  GLUCOSE 112*  BUN 57*  CREATININE 2.30*  CALCIUM 9.3   GFR: Estimated Creatinine Clearance: 19.9 mL/min (A) (by C-G formula based on SCr of 2.3 mg/dL (H)). Liver Function Tests: Recent Labs  Lab 12/09/19 0456  AST 63*  ALT 42  ALKPHOS 72  BILITOT 1.2  PROT 8.2*  ALBUMIN 3.3*   No results for input(s): LIPASE, AMYLASE in the last 168 hours. No results for input(s): AMMONIA in the last 168 hours. Coagulation Profile: No results for input(s): INR, PROTIME in the last 168 hours. Cardiac Enzymes: Recent Labs  Lab 12/09/19 0456  CKTOTAL 1,033*   BNP  (last 3 results) No results for input(s): PROBNP in the last 8760 hours. HbA1C: No results for input(s): HGBA1C in the last 72 hours. CBG: No results for input(s): GLUCAP in the last 168 hours. Lipid Profile: No results for input(s): CHOL, HDL, LDLCALC, TRIG, CHOLHDL, LDLDIRECT in the last 72 hours. Thyroid Function Tests: No results for input(s): TSH, T4TOTAL, FREET4, T3FREE, THYROIDAB in the last 72 hours. Anemia Panel: No results for input(s): VITAMINB12, FOLATE, FERRITIN, TIBC, IRON, RETICCTPCT in the last 72 hours. Urine analysis:    Component Value Date/Time   COLORURINE YELLOW 12/09/2019 0658   APPEARANCEUR CLEAR 12/09/2019 0658   LABSPEC 1.025 12/09/2019 0658   PHURINE 5.0 12/09/2019 0658   GLUCOSEU NEGATIVE 12/09/2019 0658   HGBUR LARGE (A) 12/09/2019 0658   BILIRUBINUR NEGATIVE 12/09/2019 0658   KETONESUR 5 (A) 12/09/2019 0658   PROTEINUR 30 (A) 12/09/2019 0658   NITRITE POSITIVE (A) 12/09/2019 0658   LEUKOCYTESUR LARGE (A) 12/09/2019 0658   Sepsis  Labs: !!!!!!!!!!!!!!!!!!!!!!!!!!!!!!!!!!!!!!!!!!!! @LABRCNTIP (procalcitonin:4,lacticidven:4) )No results found for this or any previous visit (from the past 240 hour(s)).   Radiological Exams on Admission: DG Knee Complete 4 Views Left  Result Date: 12/09/2019 CLINICAL DATA:  Multiple falls.  Bilateral knee pain and abrasions EXAM: LEFT KNEE - COMPLETE 4+ VIEW COMPARISON:  None. FINDINGS: No evidence of fracture, dislocation, or joint effusion. Osteopenia and atherosclerotic calcifications. IMPRESSION: No acute finding. Electronically Signed   By: Monte Fantasia M.D.   On: 12/09/2019 06:30   DG Knee Complete 4 Views Right  Result Date: 12/09/2019 CLINICAL DATA:  Recent falls.  Bilateral knee pain. EXAM: RIGHT KNEE - COMPLETE 4+ VIEW COMPARISON:  None. FINDINGS: No evidence of fracture, dislocation, or joint effusion. Osteopenic appearance. Atherosclerotic calcification in the calf and SFA. IMPRESSION: No acute finding.  Electronically Signed   By: Monte Fantasia M.D.   On: 12/09/2019 06:30    EKG: Independently reviewed.  Sinus  Assessment/Plan Principal Problem:   ARF (acute renal failure) (HCC) Active Problems:   Fall at home, initial encounter   Acute lower UTI   Rhabdomyolysis   Dehydration   Advanced age   Bilateral knee pain   1. ARF 1. Likely secondary to below rhabdomyolysis from fall and being on ground for prolonged period of time 2. Per med rec, patient had been on oxybutynin.  Will check renal ultrasound 3. We will continue IV fluid hydration, repeat basic metabolic panel in the morning 4. Follow ins and outs closely 2. UTI 1. Presenting urinalysis suggestive of urinary tract infection 2. Patient given 1 dose of Rocephin in the emergency department, will continue 3. Follow-up on urine culture results 3. Fall 1. Mechanical fall at home noted  2. We will consult physical therapy/Occupational Therapy l 4. Rhabdomyolysis 1. Presenting CK in excess of 1000 with findings of acute renal failure 2. Continue IV fluids as per above 3. Pete CK in the morning 5. Dehydration 1. Above, continue IV fluids as tolerated 6. B knee pain 1. Knee x-rays personally reviewed, no acute process noted  DVT prophylaxis: Lovenox  Code Status: Full Family Communication: Pt in room  Disposition Plan:   Consults called:  Admission status: Inpatient as it would likely require greater than 2 midnight stay for continuous IV fluid hydration given rhabdomyolysis with acute renal failure  Marylu Lund MD [facility] Pager On Amion  If 7PM-7AM, please contact night-coverage  12/09/2019, 7:56 AM

## 2019-12-09 NOTE — Progress Notes (Signed)
Patient had  Three falls at home on Saturday, Sunday and Monday. Wanna lives alone and doesn't have any family to help her. She hadn't eaten in a few days per patient. She has abrasions on both her knees and elbows from crawling on the floor after she fell and her body is covered with ecchymotic areas from her shoulders to her toes.

## 2019-12-09 NOTE — ED Notes (Signed)
Report given to Cindy, RN.

## 2019-12-09 NOTE — ED Triage Notes (Addendum)
Patient arrived via [facility] with complaints of generalized weakness. Reports two mechanical falls with no LOC. Complaints of bilateral leg pain. Skin breakdown on bilateral knees and elbows from crawling to get around home over the last 3-5 days.

## 2019-12-09 NOTE — ED Provider Notes (Addendum)
[facility] [facility] DEPT Provider Note  CSN: [id number] Arrival date & time: 12/09/19 0411  Chief Complaint(s) No chief complaint on file.  HPI Kathleen Schneider is a 79 y.o. female who presents to the emergency department after prolonged downtime at her home.  Patient reports that over the weekend, she had 2 mechanical falls.  The first 1 was on Saturday or Sunday which resulted in left knee injury.  Patient reports still being able to ambulate after that.  However the following day, she slipped while trying to get out of the water causing her to injure her right knee.  Since then she has been unable to stand up.  She reports crawling around the upstairs of her home.  During the fall she denied any head trauma or loss of consciousness.  She denies any associated nausea or vomiting, chest pain or shortness of breath, abdominal pain, dysuria, diarrhea.  Patient reports that she was able to reach the sink and drink a small amount of water with a dish bowl.  Patient has not eaten anything for 3 to 4 days.  Patient reports that she has been getting calls from her friends checking in on her.  She reports telling her friends that she was fine and did not need any assistance.  She ultimately felt too weak to get around prompting her to call EMS.  The history is provided by the patient.    Past Medical History Past Medical History:  Diagnosis Date   Vision abnormalities    Patient Active Problem List   Diagnosis Date Noted   Facet joint syndrome 05/13/2017   Spinal stenosis, lumbar 09/25/2016   Lower back pain 03/27/2016   Excessive daytime sleepiness 05/24/2015   Postherpetic neuralgia 02/18/2015   Dysesthesia 02/18/2015   Insomnia 02/18/2015   Snoring 02/18/2015   Home Medication(s) Prior to Admission medications   Medication Sig Start Date End Date Taking? Authorizing Provider  escitalopram (LEXAPRO) 5 MG tablet Take 5 mg by mouth See admin instructions.  Monday, Wednesday and friday 11/27/19  Yes [provider]  lamoTRIgine (LAMICTAL) 150 MG tablet TAKE 1 TABLET BY MOUTH TWICE A DAY Patient taking differently: Take 150 mg by mouth 2 (two) times daily.  12/07/19  Yes Sater, Nanine Means, MD  lidocaine (LIDODERM) 5 % Place 2 patches onto the skin daily. Remove & Discard patch within 12 hours or as directed by MD 11/13/18  Yes Sater, Nanine Means, MD  oxybutynin (DITROPAN) 5 MG tablet TAKE 1 TABLET BY MOUTH TWICE A DAY Patient taking differently: Take 5 mg by mouth 2 (two) times daily.  11/23/19  Yes Sater, Nanine Means, MD  meloxicam (MOBIC) 7.5 MG tablet Take 1 tablet (7.5 mg total) by mouth daily. Patient not taking: Reported on 12/09/2019 11/13/18   Britt Bottom, MD  Past Surgical History Past Surgical History:  Procedure Laterality Date   CHOLECYSTECTOMY, LAPAROSCOPIC     Family History Family History  Problem Relation Age of Onset   Seizures Mother    Alcoholism Father     Social History Social History   Tobacco Use   Smoking status: Former Smoker   Smokeless tobacco: Never Used  Substance Use Topics   Alcohol use: Yes    Alcohol/week: 0.0 standard drinks    Comment: Rare--about twice per yr/fim   Drug use: No   Allergies Patient has no known allergies.  Review of Systems Review of Systems All other systems are reviewed and are negative for acute change except as noted in the HPI  Physical Exam Vital Signs  I have reviewed the triage vital signs BP 140/88    Pulse 83    Temp 98.2 F (36.8 C) (Oral)    Resp (!) 21    Ht 5\' 2"  (1.575 m)    Wt 86.2 kg    SpO2 97%    BMI 34.75 kg/m   Physical Exam Vitals reviewed.  Constitutional:      General: She is not in acute distress.    Appearance: She is well-developed. She is not diaphoretic.     Comments: Disheveled  HENT:     Head:  Normocephalic and atraumatic.     Nose: Nose normal.  Eyes:     General: No scleral icterus.       Right eye: No discharge.        Left eye: No discharge.     Conjunctiva/sclera: Conjunctivae normal.     Pupils: Pupils are equal, round, and reactive to light.  Cardiovascular:     Rate and Rhythm: Normal rate and regular rhythm.     Heart sounds: No murmur heard.  No friction rub. No gallop.   Pulmonary:     Effort: Pulmonary effort is normal. No respiratory distress.     Breath sounds: Normal breath sounds. No stridor. No rales.  Abdominal:     General: There is no distension.     Palpations: Abdomen is soft.     Tenderness: There is no abdominal tenderness.  Musculoskeletal:       Arms:     Cervical back: Normal range of motion and neck supple.     Right knee: Swelling present. Tenderness present.     Left knee: Swelling present. Tenderness present.       Legs:  Skin:    General: Skin is warm and dry.     Findings: No erythema or rash.  Neurological:     Mental Status: She is alert and oriented to person, place, and time.     ED Results and Treatments Labs (all labs ordered are listed, but only abnormal results are displayed) Labs Reviewed  CBC WITH DIFFERENTIAL/PLATELET - Abnormal; Notable for the following components:      Result Value   WBC 11.7 (*)    Hemoglobin 11.0 (*)    RDW 16.4 (*)    Platelets 510 (*)    Neutro Abs 9.5 (*)    Abs Immature Granulocytes 0.26 (*)    All other components within normal limits  COMPREHENSIVE METABOLIC PANEL - Abnormal; Notable for the following components:   CO2 21 (*)    Glucose, Bld 112 (*)    BUN 57 (*)    Creatinine, Ser 2.30 (*)    Total Protein 8.2 (*)    Albumin 3.3 (*)  AST 63 (*)    GFR calc non Af Amer 19 (*)    GFR calc Af Amer 23 (*)    All other components within normal limits  CK - Abnormal; Notable for the following components:   Total CK 1,033 (*)    All other components within normal limits    URINALYSIS, ROUTINE W REFLEX MICROSCOPIC - Abnormal; Notable for the following components:   Hgb urine dipstick LARGE (*)    Ketones, ur 5 (*)    Protein, ur 30 (*)    Nitrite POSITIVE (*)    Leukocytes,Ua LARGE (*)    Bacteria, UA MANY (*)    All other components within normal limits  SARS CORONAVIRUS 2 BY RT PCR (HOSPITAL ORDER, [place] [facility])  URINE CULTURE  BASIC METABOLIC PANEL                                                                                                                         EKG  EKG Interpretation  Date/Time:  Wednesday December 09 2019 04:58:26 EDT Ventricular Rate:  85 PR Interval:    QRS Duration: 86 QT Interval:  372 QTC Calculation: 443 R Axis:   -27 Text Interpretation: Sinus rhythm Borderline left axis deviation Low voltage, precordial leads Abnormal R-wave progression, early transition No significant change since last tracing Confirmed by Addison Lank [phone]) on 12/09/2019 6:06:59 AM      Radiology DG Knee Complete 4 Views Left  Result Date: 12/09/2019 CLINICAL DATA:  Multiple falls.  Bilateral knee pain and abrasions EXAM: LEFT KNEE - COMPLETE 4+ VIEW COMPARISON:  None. FINDINGS: No evidence of fracture, dislocation, or joint effusion. Osteopenia and atherosclerotic calcifications. IMPRESSION: No acute finding. Electronically Signed   By: Monte Fantasia M.D.   On: 12/09/2019 06:30   DG Knee Complete 4 Views Right  Result Date: 12/09/2019 CLINICAL DATA:  Recent falls.  Bilateral knee pain. EXAM: RIGHT KNEE - COMPLETE 4+ VIEW COMPARISON:  None. FINDINGS: No evidence of fracture, dislocation, or joint effusion. Osteopenic appearance. Atherosclerotic calcification in the calf and SFA. IMPRESSION: No acute finding. Electronically Signed   By: Monte Fantasia M.D.   On: 12/09/2019 06:30    Pertinent labs & imaging results that were available during my care of the patient were reviewed by me and considered in my medical  decision making (see chart for details).  Medications Ordered in ED Medications  sodium chloride 0.9 % bolus 1,000 mL (0 mLs Intravenous Stopped 12/09/19 0528)    Followed by  0.9 %  sodium chloride infusion (1,000 mLs Intravenous New Bag/Given 12/09/19 0457)  cefTRIAXone (ROCEPHIN) 2 g in sodium chloride 0.9 % 100 mL IVPB (has no administration in time range)  fentaNYL (SUBLIMAZE) injection 50 mcg (50 mcg Intravenous Given 12/09/19 0525)  sodium chloride 0.9 % bolus 1,000 mL (1,000 mLs Intravenous New Bag/Given 12/09/19 0659)  Procedures .1-3 Lead EKG Interpretation Performed by: Fatima Blank, MD Authorized by: Fatima Blank, MD     Interpretation: normal     ECG rate:  80   ECG rate assessment: normal     Rhythm: sinus rhythm     Ectopy: none     Conduction: normal   .Critical Care Performed by: Fatima Blank, MD Authorized by: Fatima Blank, MD    CRITICAL CARE Performed by: Grayce Sessions Azha Constantin Total critical care time: 40 minutes Critical care time was exclusive of separately billable procedures and treating other patients. Critical care was necessary to treat or prevent imminent or life-threatening deterioration. Critical care was time spent personally by me on the following activities: development of treatment plan with patient and/or surrogate as well as nursing, discussions with consultants, evaluation of patient's response to treatment, examination of patient, obtaining history from patient or surrogate, ordering and performing treatments and interventions, ordering and review of laboratory studies, ordering and review of radiographic studies, pulse oximetry and re-evaluation of patient's condition.    (including critical care time)  Medical Decision Making / ED Course I have reviewed the nursing notes for  this encounter and the patient's prior records (if available in EHR or on provided paperwork).   CHARNE MCBRIEN was evaluated in Emergency Department on 12/09/2019 for the symptoms described in the history of present illness. She was evaluated in the context of the global COVID-19 pandemic, which necessitated consideration that the patient might be at risk for infection with the SARS-CoV-2 virus that causes COVID-19. Institutional protocols and algorithms that pertain to the evaluation of patients at risk for COVID-19 are in a state of rapid change based on information released by regulatory bodies including the CDC and federal and state organizations. These policies and algorithms were followed during the patient's care in the ED.  Patient presents after prolonged downtime resulting from mechanical falls at home.    Patient is afebrile, with clear lung sounds and benign abdominal exam. Patient has abrasions over her knees and elbows consistent with her history of crawling around.   Labs notable for mild leukocytosis and anemia. There is evidence of AKI -patient has a history of stage III CKD, which appears worse today since her GFR is consistent with stage IV CKD.  This is likely due to dehydration and poor p.o. intake. Patient CK level is elevated.  She was provided with IV fluids, pain medicine. We will obtain plain films of bilateral knees to assess for any injuries.  Plain films negative for acute injury.  UA notable for urinary tract infection.  Rocephin given.  Given her AKI, rhabdo and urinary tract infection, will consult medicine for admission.        Final Clinical Impression(s) / ED Diagnoses Final diagnoses:  Fall  Traumatic rhabdomyolysis, initial encounter ([place])  AKI (acute kidney injury) ([place])  Acute cystitis with hematuria      This chart was dictated using voice recognition software.  Despite best efforts to proofread,  errors can occur which can change the  documentation meaning.     Fatima Blank, MD 12/09/19 [phone]

## 2019-12-09 NOTE — ED Notes (Signed)
Patient given a water pitcher for oral rehydration.

## 2019-12-09 NOTE — ED Notes (Signed)
Attempted to call Jenny Reichmann x2 per time listed on purple man, no response. Will try again.

## 2019-12-10 DIAGNOSIS — N179 Acute kidney failure, unspecified: Secondary | ICD-10-CM

## 2019-12-10 LAB — COMPREHENSIVE METABOLIC PANEL
ALT: 30 U/L (ref 0–44)
AST: 32 U/L (ref 15–41)
Albumin: 2.4 g/dL — ABNORMAL LOW (ref 3.5–5.0)
Alkaline Phosphatase: 59 U/L (ref 38–126)
Anion gap: 8 (ref 5–15)
BUN: 32 mg/dL — ABNORMAL HIGH (ref 8–23)
CO2: 22 mmol/L (ref 22–32)
Calcium: 8.2 mg/dL — ABNORMAL LOW (ref 8.9–10.3)
Chloride: 108 mmol/L (ref 98–111)
Creatinine, Ser: 1.56 mg/dL — ABNORMAL HIGH (ref 0.44–1.00)
GFR calc Af Amer: 36 mL/min — ABNORMAL LOW (ref >60)
GFR calc non Af Amer: 31 mL/min — ABNORMAL LOW (ref >60)
Glucose, Bld: 97 mg/dL (ref 70–99)
Potassium: 3.9 mmol/L (ref 3.5–5.1)
Sodium: 138 mmol/L (ref 135–145)
Total Bilirubin: 0.3 mg/dL (ref 0.3–1.2)
Total Protein: 5.9 g/dL — ABNORMAL LOW (ref 6.5–8.1)

## 2019-12-10 LAB — CBC
HCT: 33.2 % — ABNORMAL LOW (ref 36.0–46.0)
Hemoglobin: 9.8 g/dL — ABNORMAL LOW (ref 12.0–15.0)
MCH: 27.5 pg (ref 26.0–34.0)
MCHC: 29.5 g/dL — ABNORMAL LOW (ref 30.0–36.0)
MCV: 93 fL (ref 80.0–100.0)
Platelets: 394 10*3/uL (ref 150–400)
RBC: 3.57 MIL/uL — ABNORMAL LOW (ref 3.87–5.11)
RDW: 16.5 % — ABNORMAL HIGH (ref 11.5–15.5)
WBC: 10.7 10*3/uL — ABNORMAL HIGH (ref 4.0–10.5)
nRBC: 0 % (ref 0.0–0.2)

## 2019-12-10 LAB — CK: Total CK: 348 U/L — ABNORMAL HIGH (ref 38–234)

## 2019-12-10 MED ORDER — SODIUM CHLORIDE 0.9 % IV SOLN: 1000.0000 mL | INTRAVENOUS | Status: DC

## 2019-12-10 MED ORDER — COLLAGENASE 250 UNIT/GM EX OINT
TOPICAL_OINTMENT | Freq: Every day | CUTANEOUS | Status: DC
  Filled 2019-12-10: qty 30

## 2019-12-10 NOTE — Consult Note (Signed)
[facility] Nurse Consult Note: Reason for Consult:Weakness and debility.  Resulted indecreased mobility with abrasions to bilateral knees and elbows from crawling around Wound type:trauma Pressure Injury POA: NA Measurement: Left elbow  1 cm round slough to wound bed.  Right elbow 1.5 cm x 1 cm slough to wound  Bed Bilateral knees with bruising and 1 cm round open wounds.  Wound bed:all slough Drainage (amount, consistency, odor) minimal serosanguinous   Periwound:bruising Dressing procedure/placement/frequency: Cleanse wounds to elbows and knees, bilaterally.  Apply Santyl to wound bed. Cover with NS moist 2x2 and foam dressing.  Change daily.  Will not follow at this time.  Please re-consult if needed.  Domenic Moras MSN, RN, FNP-BC [facility] Wound, Ostomy, Continence Nurse Pager [phone]

## 2019-12-10 NOTE — Evaluation (Signed)
Occupational Therapy Evaluation Patient Details Name: Kathleen Schneider MRN: [id number] DOB: Jan 01, 1941 Today's Date: 12/10/2019    History of Present Illness Pt is 79 yo female with pmh of CKD and incontinence.  Pt presented to ED with progressive weakness following a fall on 8/22 from which she had difficulty getting up and spent several days craling around home.  Pt admitted with acute renal failure, UTI, and rhabdomyolysis   Clinical Impression   Kathleen Schneider is an 79 year old woman normally independent who reports an increasingly sedentary lifestyle admitted to hospital after several downs on the ground with acute kidney injury, UTI and rhabdomyolysis. ON evaluation patient presents with generalized weakness, decreased activity tolerance and impaired balance resulting in decreased ability to perform independent mobility and ADLs. Patient needed use of BSC x 2 - needing max assist for toileting. Patient unable to reach her lower extremities due to injured bilateral knees from crawling on the floor. Patient emotional throughout evaluation and crying when discussing the aging process and her current deficits. Patient will benefit from skilled OT services to improve deficits, educate patient on needed DME and compensatory strategies in order to return to PLOF. At this time patient would benefit from short term rehab at discharge.    Follow Up Recommendations       Equipment Recommendations       Recommendations for Other Services       Precautions / Restrictions Precautions Precautions: Fall Restrictions Weight Bearing Restrictions: No      Mobility Bed Mobility Overal bed mobility: Needs Assistance Bed Mobility: Supine to Sit     Supine to sit: Min assist        Transfers Overall transfer level: Needs assistance Equipment used: Rolling walker (2 wheeled) Transfers: Sit to/from Stand Sit to Stand: Min assist         General transfer comment: Min assist for  steadying. verbal cues for hand placement.    Balance Overall balance assessment: Needs assistance Sitting-balance support: No upper extremity supported;Feet supported Sitting balance-[name]: Good     Standing balance support: Bilateral upper extremity supported;During functional activity Standing balance-[name]: Poor Standing balance comment: required RW                           ADL either performed or assessed with clinical judgement   ADL Overall ADL's : Needs assistance/impaired Eating/Feeding: Independent   Grooming: Sitting;Set up   Upper Body Bathing: Set up;Sitting   Lower Body Bathing: Maximal assistance;Sit to/from stand   Upper Body Dressing : Set up;Sitting   Lower Body Dressing: Maximal assistance;Sit to/from stand   Toilet Transfer: Minimal assistance;Stand-pivot;BSC;RW   Toileting- Clothing Manipulation and Hygiene: Maximal assistance;Sit to/from stand Toileting - Clothing Manipulation Details (indicate cue type and reason): Patient able to wipe periarea and therapist assisted with buttocks.     Functional mobility during ADLs: Minimal assistance;Rolling walker       Vision   Vision Assessment?: No apparent visual deficits     Perception     Praxis      Pertinent Vitals/Pain Pain Assessment: No/denies pain     Hand Dominance Right   Extremity/Trunk Assessment Upper Extremity Assessment Upper Extremity Assessment: Overall WFL for tasks assessed   Lower Extremity Assessment Lower Extremity Assessment: Defer to PT evaluation RLE Deficits / Details: ROM WFL; MMT 4+/5 LLE Deficits / Details: ROM WFL; MMT 4+/5       Communication Communication Communication: No difficulties  Cognition Arousal/Alertness: Awake/alert Behavior During Therapy: WFL for tasks assessed/performed Overall Cognitive Status: Within Functional Limits for tasks assessed                                 General Comments: Patient  emotional at times due to her increasing debility.   General Comments  Pt needed cues to avoid doorway and chair in hall on R side with ambulation.  Checked EOEM and visual fields upon return to room and they were intact.  Pt reports she felt like walker was pulling to R.    Exercises     Shoulder Instructions      Home Living Family/patient expects to be discharged to:: Unsure Living Arrangements: Alone Available Help at Discharge: Family;Available PRN/intermittently Type of Home: House Home Access: Stairs to enter Entrance Stairs-Number of Steps: 2   Home Layout: Two level;Bed/bath upstairs Alternate Level Stairs-Number of Steps: flight   Bathroom Shower/Tub: Occupational psychologist: Standard     Home Equipment: Cane - single point;Walker - 2 wheels   Additional Comments: has been using a step stool in the shower      Prior Functioning/Environment Level of Independence: Independent        Comments: Independent with ADLs. Reports increasingly sedentary lifestyle and decreased activity tolerance. Shopping at night. Decreased ablity to "wait in line," using a drug store with a drive through, driving to mailbox instead of walking.        OT Problem List: Decreased strength;Decreased activity tolerance;Impaired vision/perception;Impaired balance (sitting and/or standing);Decreased safety awareness;Decreased knowledge of use of DME or AE;Obesity      OT Treatment/Interventions: Self-care/ADL training;Therapeutic exercise;DME and/or AE instruction;Therapeutic activities;Balance training;Patient/family education    OT Goals(Current goals can be found in the care plan section) Acute Rehab OT Goals Patient Stated Goal: improve independence OT Goal Formulation: With patient Time For Goal Achievement: 12/24/19 Potential to Achieve Goals: Good  OT Frequency: Min 2X/week   Barriers to D/C: Decreased caregiver support;Inaccessible home environment           Co-evaluation              AM-PAC OT "6 Clicks" Daily Activity     Outcome Measure Help from another person eating meals?: None Help from another person taking care of personal grooming?: A Little Help from another person toileting, which includes using toliet, bedpan, or urinal?: A Lot Help from another person bathing (including washing, rinsing, drying)?: A Lot Help from another person to put on and taking off regular upper body clothing?: A Little Help from another person to put on and taking off regular lower body clothing?: A Lot 6 Click Score: 16   End of Session Equipment Utilized During Treatment: Gait belt;Rolling walker Nurse Communication: Mobility status  Activity Tolerance: Patient tolerated treatment well Patient left: in chair;with call bell/phone within reach;with chair alarm set  OT Visit Diagnosis: Unsteadiness on feet (R26.81);Repeated falls (R29.6);Muscle weakness (generalized) (M62.81)                Time: 8563-1497 OT Time Calculation (min): 54 min Charges:  OT General Charges $OT Visit: 1 Visit OT Evaluation $OT Eval Moderate Complexity: 1 Mod OT Treatments $Self Care/Home Management : 23-37 mins  Kathleen Schneider, OTR/L [place] [phone] Pager: [place] 12/10/2019, 2:07 PM

## 2019-12-10 NOTE — TOC Initial Note (Signed)
Transition of Care [facility]) - Initial/Assessment Note    Patient Details  Name: Kathleen Schneider MRN: [id number] Date of Birth: 1940-06-14  [facility] CM/SW Contact:    Shade Flood, LCSW Phone Number: 12/10/2019, 10:20 AM  Clinical Narrative:                  Pt admitted from home. PT recommending SNF rehab. Met with pt at bedside today to assess. Per pt, she lives alone in a two story home in [place]. Pt's friend is her primary support person. Pt is agreeable to SNF rehab. Discussed CMS list of local SNFs. Will refer as requested and will follow up with pt once bed offers available. Pt states that her friend will transport her to the facility at dc. Pt will need insurance authorization as well before her transfer.  Expected Discharge Plan: Skilled Nursing Facility Barriers to Discharge: Continued Medical Work up   Patient Goals and CMS Choice Patient states their goals for this hospitalization and ongoing recovery are:: get better CMS Medicare.gov Compare Post Acute Care list provided to:: Patient Choice offered to / list presented to : Patient  Expected Discharge Plan and Services Expected Discharge Plan: [place] In-house Referral: Clinical Social Work   Post Acute Care Choice: [place] Living arrangements for the past 2 months: [place]                                      Prior Living Arrangements/Services Living arrangements for the past 2 months: Single Family Home Lives with:: Self Patient language and need for interpreter reviewed:: Yes Do you feel safe going back to the place where you live?: Yes      Need for Family Participation in Patient Care: No (Comment) Care giver support system in place?: No (comment)   Criminal Activity/Legal Involvement Pertinent to Current Situation/Hospitalization: No - Comment as needed  Activities of Daily Living Home Assistive Devices/Equipment: Eyeglasses ADL  Screening (condition at time of admission) Patient's cognitive ability adequate to safely complete daily activities?: Yes Is the patient deaf or have difficulty hearing?: No Does the patient have difficulty seeing, even when wearing glasses/contacts?: No Does the patient have difficulty concentrating, remembering, or making decisions?: No Patient able to express need for assistance with ADLs?: Yes Does the patient have difficulty dressing or bathing?: Yes Independently performs ADLs?: No Communication: Independent Dressing (OT): Needs assistance Is this a change from baseline?: Change from baseline, expected to last >3 days Grooming: Needs assistance Is this a change from baseline?: Change from baseline, expected to last >3 days Feeding: Needs assistance Is this a change from baseline?: Change from baseline, expected to last >3 days Bathing: Needs assistance Is this a change from baseline?: Change from baseline, expected to last >3 days Toileting: Dependent Is this a change from baseline?: Change from baseline, expected to last >3days In/Out Bed: Dependent Is this a change from baseline?: Change from baseline, expected to last >3 days Walks in Home: Dependent Is this a change from baseline?: Change from baseline, expected to last >3 days Does the patient have difficulty walking or climbing stairs?: Yes (had a recent fall and has been having to move around on elbows and knees) Weakness of Legs: Both Weakness of Arms/Hands: None  Permission Sought/Granted Permission sought to share information with : Facility Sport and exercise psychologist Permission granted to share information with : Yes,  Verbal Permission Granted     Permission granted to share info w AGENCY: local snfs        Emotional Assessment Appearance:: Appears stated age Attitude/Demeanor/Rapport: Engaged Affect (typically observed): Pleasant Orientation: : Oriented to Self, Oriented to Place, Oriented to  Time, Oriented to  Situation Alcohol / Substance Use: Not Applicable Psych Involvement: No (comment)  Admission diagnosis:  ARF (acute renal failure) ([place]) [N17.9] Fall [W19.XXXA] Acute cystitis with hematuria [N30.01] AKI (acute kidney injury) ([place]) [N17.9] Traumatic rhabdomyolysis, initial encounter ([place]) [T79.6XXA] Patient Active Problem List   Diagnosis Date Noted  . ARF (acute renal failure) ([place]) 12/09/2019  . Fall at home, initial encounter 12/09/2019  . Acute lower UTI 12/09/2019  . Rhabdomyolysis 12/09/2019  . Dehydration 12/09/2019  . Advanced age 59/25/2021  . Bilateral knee pain 12/09/2019  . Facet joint syndrome 05/13/2017  . Spinal stenosis, lumbar 09/25/2016  . Lower back pain 03/27/2016  . Excessive daytime sleepiness 05/24/2015  . Postherpetic neuralgia 02/18/2015  . Dysesthesia 02/18/2015  . Insomnia 02/18/2015  . Snoring 02/18/2015   PCP:  Donald Prose, MD Pharmacy:   [id number] [id number]: [phone]: [phone]    Social Determinants of Health (SDOH) Interventions    Readmission Risk Interventions Readmission Risk Prevention Plan 12/10/2019  Medication Screening Complete  Transportation Screening Complete  Some recent data might be hidden

## 2019-12-10 NOTE — Evaluation (Signed)
Physical Therapy Evaluation Patient Details Name: Kathleen Schneider MRN: [id number] DOB: 1941/01/15 Today's Date: 12/10/2019   History of Present Illness  Pt is 79 yo female with pmh of CKD and incontinence.  Pt presented to ED with progressive weakness following a fall on 8/22 from which she had difficulty getting up and spent several days craling around home.  Pt admitted with acute renal failure, UTI, and rhabdomyolysis  Clinical Impression  Pt admitted with above diagnosis. She presents with decreased mobility, strength, endurance, safety, and balance.  Pt with hx of recent falls, lives alone, and in 2 level home.  Today she fatigued easily with gait and required min A for transfers and gait for safety.  Pt will benefit from acute PT services.  Recommend d/c to SNF at d/c due to living alone, fall risk, requires assistance, and stairs. Pt currently with functional limitations due to the deficits listed below (see PT Problem List). Pt will benefit from skilled PT to increase their independence and safety with mobility to allow discharge to the venue listed below.       Follow Up Recommendations SNF    Equipment Recommendations  None recommended by PT    Recommendations for Other Services       Precautions / Restrictions Precautions Precautions: Fall Restrictions Weight Bearing Restrictions: No      Mobility  Bed Mobility Overal bed mobility: Needs Assistance Bed Mobility: Supine to Sit     Supine to sit: Min assist        Transfers Overall transfer level: Needs assistance Equipment used: Rolling walker (2 wheeled) Transfers: Sit to/from Stand Sit to Stand: Min assist         General transfer comment: min cues for safe hand placement; increased time; cues to avoid objects on R side with ambulation  Ambulation/Gait Ambulation/Gait assistance: Min assist Gait Distance (Feet): 100 Feet Assistive device: Rolling walker (2 wheeled) Gait Pattern/deviations: Step-through  pattern;Decreased stride length;Trunk flexed Gait velocity: decreased   General Gait Details: 3 standing rest breaks; cues for posture  Stairs            Wheelchair Mobility    Modified Rankin (Stroke Patients Only)       Balance Overall balance assessment: Needs assistance Sitting-balance support: No upper extremity supported;Feet supported Sitting balance-[name]: Good     Standing balance support: Bilateral upper extremity supported;During functional activity Standing balance-[name]: Poor Standing balance comment: required RW                             Pertinent Vitals/Pain Pain Assessment: No/denies pain    Home Living Family/patient expects to be discharged to:: Unsure Living Arrangements: Alone Available Help at Discharge: Family;Available PRN/intermittently Type of Home: House Home Access: Stairs to enter   Entrance Stairs-Number of Steps: 2 Home Layout: Two level;Bed/bath upstairs Home Equipment: Cane - single point;Walker - 2 wheels Additional Comments: has been using a step stool in the shower    Prior Function Level of Independence: Independent         Comments: Independent with ADLs. Reports increasingly sedentary lifestyle and decreased activity tolerance. Shopping at night. Decreased ablity to "wait in line," using a drug store with a drive through, driving to mailbox instead of walking.     Hand Dominance   Dominant Hand: Right    Extremity/Trunk Assessment   Upper Extremity Assessment Upper Extremity Assessment: Defer to OT evaluation    Lower Extremity  Assessment Lower Extremity Assessment: RLE deficits/detail;LLE deficits/detail RLE Deficits / Details: ROM WFL; MMT 4+/5 LLE Deficits / Details: ROM WFL; MMT 4+/5       Communication   Communication: No difficulties  Cognition Arousal/Alertness: Awake/alert Behavior During Therapy: WFL for tasks assessed/performed Overall Cognitive Status: Within  Functional Limits for tasks assessed                                        General Comments General comments (skin integrity, edema, etc.): Pt needed cues to avoid doorway and chair in hall on R side with ambulation.  Checked EOEM and visual fields upon return to room and they were intact.  Pt reports she felt like walker was pulling to R.    Exercises     Assessment/Plan    PT Assessment Patient needs continued PT services  PT Problem List Decreased strength;Decreased mobility;Decreased safety awareness;Decreased range of motion;Decreased activity tolerance;Decreased balance;Decreased knowledge of use of DME       PT Treatment Interventions DME instruction;Therapeutic activities;Gait training;Therapeutic exercise;Patient/family education;Stair training;Balance training;Functional mobility training    PT Goals (Current goals can be found in the Care Plan section)  Acute Rehab PT Goals Patient Stated Goal: get stronger PT Goal Formulation: With patient Time For Goal Achievement: 12/24/19 Potential to Achieve Goals: Good    Frequency Min 2X/week   Barriers to discharge Decreased caregiver support;Inaccessible home environment      Co-evaluation               AM-PAC PT "6 Clicks" Mobility  Outcome Measure Help needed turning from your back to your side while in a flat bed without using bedrails?: A Little Help needed moving from lying on your back to sitting on the side of a flat bed without using bedrails?: A Little Help needed moving to and from a bed to a chair (including a wheelchair)?: A Little Help needed standing up from a chair using your arms (e.g., wheelchair or bedside chair)?: A Little Help needed to walk in hospital room?: A Little Help needed climbing 3-5 steps with a railing? : A Lot 6 Click Score: 17    End of Session Equipment Utilized During Treatment: Gait belt Activity Tolerance: Patient tolerated treatment well Patient left: with  chair alarm set;in chair;with call bell/phone within reach Nurse Communication: Mobility status PT Visit Diagnosis: Other abnormalities of gait and mobility (R26.89);History of falling (Z91.81);Muscle weakness (generalized) (M62.81)    Time: 2025-4270 PT Time Calculation (min) (ACUTE ONLY): 20 min   Charges:   PT Evaluation $PT Eval Low Complexity: 1 Low          Tulio Facundo, PT Acute Rehab Services Pager [phone] [facility] 12/10/2019, 11:44 AM

## 2019-12-10 NOTE — Progress Notes (Signed)
PROGRESS NOTE    Kathleen Schneider  HGD:[id number] DOB: 1941/04/04 DOA: 12/09/2019 PCP: Donald Prose, MD   Brief Narrative:  Patient is 79 year old female with history of CKD stage III, urinary incontinence who presents to the emergency department with progressive weakness, falls at home.  On [place] she was found to have AKI with creatinine of 2.3.  Imagings did not show any fracture or dislocation.  UA was suggestive of possible urinary tract infection.  CK was elevated.  She was admitted for the management of AKI, rhabdomyolysis and UTI.  PT/OT consulted and recommended SNF  Assessment & Plan:   Principal Problem:   ARF (acute renal failure) ([place]) Active Problems:   Fall at home, initial encounter   Acute lower UTI   Rhabdomyolysis   Dehydration   Advanced age   Bilateral knee pain   Acute kidney injury on CKD stage III: Baseline creatinine unknown, nothing was found on the chart.  Presented with creatinine of 2.3.  Improving with IV fluids.  Most likely associated with rhabdomyolysis, dehydration.  Continue to monitor BMP.  Multiple falls at home: PT/OT consulted and recommended skilled nursing facility on discharge.  She lives alone in [place] [facility].  Suspected UTI: Urinalysis was suggestive of possible UTI.  Urine culture showing Ecoli.  On ceftriaxone.  Will follow final culture and sensitivity.  Rhabdomyolysis: Improved with IV fluids.  Bilateral knee pain: Most likely secondary to osteoarthritis.  Imaging did not show any fracture/ dislocation.  Urinary incontinence: Takes oxybutynin at home  History of neuralgia/depression: On Lexapro and lamotrigine.  Ulcers on bilateral knees/elbows: Superficial ulcers.  No indication for antibiotic therapy.  Wound care consulted.           DVT prophylaxis: Lovenox Code Status: Full Family Communication: Family not present in [place].  I called a number she gave me, call not received. Status is: Inpatient  Remains  inpatient appropriate because:Unsafe d/c plan   Dispo: The patient is from: Home              Anticipated d/c is to: SNF              Anticipated d/c date is:  SnF            needs 1 more days before ensuring safe discharge.  Continue IV fluids     Consultants: None  Procedures: None  Antimicrobials:  Anti-infectives (From admission, onward)   Start     Dose/Rate Route Frequency Ordered Stop   12/10/19 0800  cefTRIAXone (ROCEPHIN) 1 g in sodium chloride 0.9 % 100 mL IVPB        1 g 200 mL/hr over 30 Minutes Intravenous Every 24 hours 12/09/19 0817     12/09/19 0745  cefTRIAXone (ROCEPHIN) 2 g in sodium chloride 0.9 % 100 mL IVPB        2 g 200 mL/hr over 30 Minutes Intravenous  Once 12/09/19 9622 12/09/19 1107      Subjective: Patient seen and examined the bedside this morning.  Hemodynamically stable.  Very emotional because she fell and was alone at home.  Not in apparent distress.  Objective: Vitals:   12/09/19 1649 12/09/19 2109 12/10/19 0003 12/10/19 0559  BP: 135/63 116/60 126/65 116/61  Pulse: 75 77 73 65  Resp: 16 14 14 14   Temp: 97.9 F (36.6 C) (!) 97.5 F (36.4 C) 98.5 F (36.9 C) 97.7 F (36.5 C)  TempSrc: Oral Oral Oral Oral  SpO2: 94% 93% 92% 97%  Weight:  Height:        Intake/Output Summary (Last 24 hours) at 12/10/2019 0800 Last data filed at 12/10/2019 0421 Gross per 24 hour  Intake 3200.95 ml  Output 300 ml  Net 2900.95 ml   Filed Weights   12/09/19 0433  Weight: 86.2 kg    Examination:  General exam: Debilitated, deconditioned elderly female  HEENT:PERRL,Oral mucosa moist, Ear/Nose normal on gross exam Respiratory system: Bilateral equal air entry, normal vesicular breath sounds, no wheezes or crackles  Cardiovascular system: S1 & S2 heard, RRR. No JVD, murmurs, rubs, gallops or clicks. No pedal edema. Gastrointestinal system: Abdomen is nondistended, soft and nontender. No organomegaly or masses felt. Normal bowel sounds  heard. Central nervous system: Alert and oriented.  Extremities: No edema, no clubbing ,no cyanosis Skin: Superficial ulcerations on bilateral knees and elbows    Data Reviewed: I have personally reviewed following labs and imaging studies  CBC: Recent Labs  Lab 12/09/19 0456 12/10/19 0604  WBC 11.7* 10.7*  NEUTROABS 9.5*  --   HGB 11.0* 9.8*  HCT 36.4 33.2*  MCV 90.1 93.0  PLT 510* 194   Basic Metabolic Panel: Recent Labs  Lab 12/09/19 0456 12/10/19 0604  NA 145 138  K 4.9 3.9  CL 109 108  CO2 21* 22  GLUCOSE 112* 97  BUN 57* 32*  CREATININE 2.30* 1.56*  CALCIUM 9.3 8.2*   GFR: Estimated Creatinine Clearance: 29.3 mL/min (A) (by C-G formula based on SCr of 1.56 mg/dL (H)). Liver Function Tests: Recent Labs  Lab 12/09/19 0456 12/10/19 0604  AST 63* 32  ALT 42 30  ALKPHOS 72 59  BILITOT 1.2 0.3  PROT 8.2* 5.9*  ALBUMIN 3.3* 2.4*   No results for input(s): LIPASE, AMYLASE in the last 168 hours. No results for input(s): AMMONIA in the last 168 hours. Coagulation Profile: No results for input(s): INR, PROTIME in the last 168 hours. Cardiac Enzymes: Recent Labs  Lab 12/09/19 0456 12/10/19 0604  CKTOTAL 1,033* 348*   BNP (last 3 results) No results for input(s): PROBNP in the last 8760 hours. HbA1C: No results for input(s): HGBA1C in the last 72 hours. CBG: No results for input(s): GLUCAP in the last 168 hours. Lipid Profile: No results for input(s): CHOL, HDL, LDLCALC, TRIG, CHOLHDL, LDLDIRECT in the last 72 hours. Thyroid Function Tests: No results for input(s): TSH, T4TOTAL, FREET4, T3FREE, THYROIDAB in the last 72 hours. Anemia Panel: No results for input(s): VITAMINB12, FOLATE, FERRITIN, TIBC, IRON, RETICCTPCT in the last 72 hours. Sepsis Labs: No results for input(s): PROCALCITON, LATICACIDVEN in the last 168 hours.  Recent Results (from the past 240 hour(s))  SARS Coronavirus 2 by RT PCR (hospital order, performed in [facility] hospital  lab) Nasopharyngeal Nasopharyngeal Swab     Status: None   Collection Time: 12/09/19  7:49 AM   Specimen: Nasopharyngeal Swab  Result Value Ref Range Status   SARS Coronavirus 2 NEGATIVE NEGATIVE Final    Comment: (NOTE) SARS-CoV-2 target nucleic acids are NOT DETECTED.  The SARS-CoV-2 RNA is generally detectable in upper and lower respiratory specimens during the acute phase of infection. The lowest concentration of SARS-CoV-2 viral copies this assay can detect is 250 copies / mL. A negative result does not preclude SARS-CoV-2 infection and should not be used as the sole basis for treatment or other patient management decisions.  A negative result may occur with improper specimen collection / handling, submission of specimen other than nasopharyngeal swab, presence of viral mutation(s) within the areas  targeted by this assay, and inadequate number of viral copies (<250 copies / mL). A negative result must be combined with clinical observations, patient history, and epidemiological information.  Fact Sheet for Patients:   StrictlyIdeas.no  Fact Sheet for Healthcare Providers: BankingDealers.co.za  This test is not yet approved or  cleared by the [place] FDA and has been authorized for detection and/or diagnosis of SARS-CoV-2 by FDA under an Emergency Use Authorization (EUA).  This EUA will remain in effect (meaning this test can be used) for the duration of the COVID-19 declaration under Section 564(b)(1) of the Act, 21 U.S.C. section 360bbb-3(b)(1), unless the authorization is terminated or revoked sooner.  Performed at [facility], [place] [address] [place],  [address]          Radiology Studies: US RENAL  Result Date: 12/09/2019 CLINICAL DATA:  Acute renal failure EXAM: RENAL / URINARY TRACT ULTRASOUND COMPLETE COMPARISON:  None. FINDINGS: Right Kidney: Renal measurements: 12.1 x 4.5 x 6.4 cm  = volume: 183 mL. Echogenicity within normal limits. No mass. Mild hydronephrosis. Left Kidney: Renal measurements: 11.0 x 6.9 x 6.3 cm = volume: 252 mL. Echogenicity within normal limits. No mass. Moderate hydronephrosis. Bladder: Not discretely visualized/decompressed. Other: None. IMPRESSION: Moderate left hydronephrosis.  Mild right hydronephrosis. Bladder not discretely visualized/decompressed. Electronically Signed   By: Julian Hy M.D.   On: 12/09/2019 09:40   DG Knee Complete 4 Views Left  Result Date: 12/09/2019 CLINICAL DATA:  Multiple falls.  Bilateral knee pain and abrasions EXAM: LEFT KNEE - COMPLETE 4+ VIEW COMPARISON:  None. FINDINGS: No evidence of fracture, dislocation, or joint effusion. Osteopenia and atherosclerotic calcifications. IMPRESSION: No acute finding. Electronically Signed   By: Monte Fantasia M.D.   On: 12/09/2019 06:30   DG Knee Complete 4 Views Right  Result Date: 12/09/2019 CLINICAL DATA:  Recent falls.  Bilateral knee pain. EXAM: RIGHT KNEE - COMPLETE 4+ VIEW COMPARISON:  None. FINDINGS: No evidence of fracture, dislocation, or joint effusion. Osteopenic appearance. Atherosclerotic calcification in the calf and SFA. IMPRESSION: No acute finding. Electronically Signed   By: Monte Fantasia M.D.   On: 12/09/2019 06:30        Scheduled Meds: . enoxaparin (LOVENOX) injection  30 mg Subcutaneous Q24H  . escitalopram  5 mg Oral Q M,W,F  . lamoTRIgine  150 mg Oral BID  . oxybutynin  5 mg Oral BID   Continuous Infusions: . sodium chloride 1,000 mL (12/09/19 0457)  . cefTRIAXone (ROCEPHIN)  IV 1 g (12/10/19 0756)     LOS: 1 day    Time spent: 35 mins.More than 50% of that time was spent in counseling and/or coordination of care.      Shelly Coss, MD [facility] P8/26/2021, 8:00 AM

## 2019-12-10 NOTE — NC FL2 (Signed)
[place] LEVEL OF CARE SCREENING TOOL     IDENTIFICATION  Patient Name: Kathleen Schneider Birthdate: [birth year] Sex: female Admission Date (Current Location): 12/09/2019  [facility] [place] Number:  Herbalist and Address:  [facility],  [place] [address], [place]      Provider Number: [id number]  Attending Physician Name and Address:  Shelly Coss, MD  Relative Name and Phone Number:       Current Level of Care: Hospital Recommended Level of Care: [place] Prior Approval Number:    Date Approved/Denied:   PASRR Number: [id number]  Discharge Plan: SNF    Current Diagnoses: Patient Active Problem List   Diagnosis Date Noted  . ARF (acute renal failure) ([place]) 12/09/2019  . Fall at home, initial encounter 12/09/2019  . Acute lower UTI 12/09/2019  . Rhabdomyolysis 12/09/2019  . Dehydration 12/09/2019  . Advanced age 79/25/2021  . Bilateral knee pain 12/09/2019  . Facet joint syndrome 05/13/2017  . Spinal stenosis, lumbar 09/25/2016  . Lower back pain 03/27/2016  . Excessive daytime sleepiness 05/24/2015  . Postherpetic neuralgia 02/18/2015  . Dysesthesia 02/18/2015  . Insomnia 02/18/2015  . Snoring 02/18/2015    Orientation RESPIRATION BLADDER Height & Weight     Self, Time, Situation, Place  Normal Continent Weight: 190 lb (86.2 kg) Height:  5\' 2"  (157.5 cm)  BEHAVIORAL SYMPTOMS/MOOD NEUROLOGICAL BOWEL NUTRITION STATUS      Continent Diet (see dc summary)  AMBULATORY STATUS COMMUNICATION OF NEEDS Skin   Extensive Assist Verbally Skin abrasions (bilateral knees and elbows)                       Personal Care Assistance Level of Assistance  Bathing, Feeding, Dressing Bathing Assistance: Limited assistance Feeding assistance: Independent Dressing Assistance: Limited assistance     Functional Limitations Info  Sight, Hearing, Speech Sight Info: Adequate Hearing Info: Adequate Speech  Info: Adequate    SPECIAL CARE FACTORS FREQUENCY  PT (By licensed PT), OT (By licensed OT)     PT Frequency: 5x week OT Frequency: 3x week            Contractures Contractures Info: Not present    Additional Factors Info  Code Status, Allergies, Psychotropic Code Status Info: Full Allergies Info: NKA Psychotropic Info: Lexapro         Current Medications (12/10/2019):  This is the current hospital active medication list Current Facility-Administered Medications  Medication Dose Route Frequency Provider Last Rate Last Admin  . 0.9 %  sodium chloride infusion  1,000 mL Intravenous Continuous Cardama, Grayce Sessions, MD 125 mL/hr at 12/09/19 0457 1,000 mL at 12/09/19 0457  . acetaminophen (TYLENOL) tablet 650 mg  650 mg Oral Q6H PRN Donne Hazel, MD       Or  . acetaminophen (TYLENOL) suppository 650 mg  650 mg Rectal Q6H PRN Donne Hazel, MD      . cefTRIAXone (ROCEPHIN) 1 g in sodium chloride 0.9 % 100 mL IVPB  1 g Intravenous Q24H Donne Hazel, MD 200 mL/hr at 12/10/19 0756 1 g at 12/10/19 0756  . enoxaparin (LOVENOX) injection 30 mg  30 mg Subcutaneous Q24H Donne Hazel, MD   30 mg at 12/10/19 1002  . escitalopram (LEXAPRO) tablet 5 mg  5 mg Oral Q M,W,F Donne Hazel, MD   5 mg at 12/09/19 1316  . HYDROcodone-acetaminophen (NORCO/VICODIN) 5-325 MG per tablet 1-2 tablet  1-2 tablet  Oral Q4H PRN Donne Hazel, MD      . lamoTRIgine (LAMICTAL) tablet 150 mg  150 mg Oral BID Donne Hazel, MD   150 mg at 12/10/19 1002  . oxybutynin (DITROPAN) tablet 5 mg  5 mg Oral BID Donne Hazel, MD   5 mg at 12/10/19 1002     Discharge Medications: Please see discharge summary for a list of discharge medications.  Relevant Imaging Results:  Relevant Lab Results:   Additional Information SSN: [id number], [place]

## 2019-12-11 DIAGNOSIS — M6282 Rhabdomyolysis: Secondary | ICD-10-CM | POA: Diagnosis not present

## 2019-12-11 DIAGNOSIS — F339 Major depressive disorder, recurrent, unspecified: Secondary | ICD-10-CM | POA: Diagnosis not present

## 2019-12-11 DIAGNOSIS — F329 Major depressive disorder, single episode, unspecified: Secondary | ICD-10-CM | POA: Diagnosis not present

## 2019-12-11 DIAGNOSIS — N183 Chronic kidney disease, stage 3 unspecified: Secondary | ICD-10-CM | POA: Diagnosis not present

## 2019-12-11 DIAGNOSIS — I10 Essential (primary) hypertension: Secondary | ICD-10-CM | POA: Diagnosis not present

## 2019-12-11 DIAGNOSIS — R32 Unspecified urinary incontinence: Secondary | ICD-10-CM | POA: Diagnosis not present

## 2019-12-11 DIAGNOSIS — N390 Urinary tract infection, site not specified: Secondary | ICD-10-CM | POA: Diagnosis not present

## 2019-12-11 DIAGNOSIS — R110 Nausea: Secondary | ICD-10-CM | POA: Diagnosis not present

## 2019-12-11 DIAGNOSIS — Z743 Need for continuous supervision: Secondary | ICD-10-CM | POA: Diagnosis not present

## 2019-12-11 DIAGNOSIS — R279 Unspecified lack of coordination: Secondary | ICD-10-CM | POA: Diagnosis not present

## 2019-12-11 DIAGNOSIS — E119 Type 2 diabetes mellitus without complications: Secondary | ICD-10-CM | POA: Diagnosis not present

## 2019-12-11 DIAGNOSIS — R2689 Other abnormalities of gait and mobility: Secondary | ICD-10-CM | POA: Diagnosis not present

## 2019-12-11 DIAGNOSIS — R296 Repeated falls: Secondary | ICD-10-CM | POA: Diagnosis not present

## 2019-12-11 DIAGNOSIS — M25562 Pain in left knee: Secondary | ICD-10-CM | POA: Diagnosis not present

## 2019-12-11 DIAGNOSIS — M25561 Pain in right knee: Secondary | ICD-10-CM | POA: Diagnosis not present

## 2019-12-11 DIAGNOSIS — R278 Other lack of coordination: Secondary | ICD-10-CM | POA: Diagnosis not present

## 2019-12-11 DIAGNOSIS — R41841 Cognitive communication deficit: Secondary | ICD-10-CM | POA: Diagnosis not present

## 2019-12-11 DIAGNOSIS — N179 Acute kidney failure, unspecified: Secondary | ICD-10-CM | POA: Diagnosis not present

## 2019-12-11 LAB — BASIC METABOLIC PANEL
Anion gap: 7 (ref 5–15)
BUN: 22 mg/dL (ref 8–23)
CO2: 23 mmol/L (ref 22–32)
Calcium: 8.6 mg/dL — ABNORMAL LOW (ref 8.9–10.3)
Chloride: 112 mmol/L — ABNORMAL HIGH (ref 98–111)
Creatinine, Ser: 1.67 mg/dL — ABNORMAL HIGH (ref 0.44–1.00)
GFR calc Af Amer: 33 mL/min — ABNORMAL LOW (ref >60)
GFR calc non Af Amer: 29 mL/min — ABNORMAL LOW (ref >60)
Glucose, Bld: 114 mg/dL — ABNORMAL HIGH (ref 70–99)
Potassium: 4.6 mmol/L (ref 3.5–5.1)
Sodium: 142 mmol/L (ref 135–145)

## 2019-12-11 LAB — URINE CULTURE: Culture: >=100000 — AB

## 2019-12-11 MED ORDER — COLLAGENASE 250 UNIT/GM EX OINT
TOPICAL_OINTMENT | Freq: Every day | CUTANEOUS | 0 refills | Status: DC
Start: 2019-12-12 — End: 2020-02-17

## 2019-12-11 MED ORDER — CEPHALEXIN 500 MG PO CAPS: 500.0000 mg | ORAL_CAPSULE | Freq: Three times a day (TID) | ORAL | 0 refills | Status: AC

## 2019-12-11 MED ORDER — LIDOCAINE 5 % EX PTCH: 1.0000 | MEDICATED_PATCH | CUTANEOUS | 0 refills | Status: DC

## 2019-12-11 MED ORDER — LIDOCAINE 5 % EX PTCH
1.0000 | MEDICATED_PATCH | CUTANEOUS | Status: DC
  Administered 2019-12-11: 1 via TRANSDERMAL
  Filled 2019-12-11: qty 1

## 2019-12-11 NOTE — Progress Notes (Signed)
Report attempted twice to nurse at [facility]. Phone number given for call back.

## 2019-12-11 NOTE — TOC Transition Note (Signed)
Transition of Care [facility]) - CM/SW Discharge Note   Patient Details  Name: Kathleen Schneider MRN: [id number] Date of Birth: [birth year]  [facility]) CM/SW Contact:  Shade Flood, LCSW Phone Number: 12/11/2019, 11:14 AM   Clinical Narrative:     Pt stable for dc per MD. Pt selected [facility] and is agreeable to transfer today. Insurance auth complete. Pt prefers to go by [facility] even if her insurance does not authorize.   DC clinical sent electronically. [facility] arranged for 1300 but may have to be rescheduled if pt cannot get payment form here in time.   Updated RN. Pt going to room 3221. Report can be called to [phone].  DC envelope will be at RN station prior to EMS arrival.  Final next level of care: [place] Barriers to Discharge: Barriers Resolved   Patient Goals and CMS Choice Patient states their goals for this hospitalization and ongoing recovery are:: get better CMS Medicare.gov Compare Post Acute Care list provided to:: Patient Choice offered to / list presented to : Patient  Discharge Placement PASRR number recieved: 12/10/19            Patient [facility] bed at: [facility] Patient to be transferred to facility by: [place] Name of family member notified: pt notified her friend Patient and family notified of of transfer: 12/11/19  Discharge Plan and Services In-house Referral: Clinical Social Work   Post Acute Care Choice: [place]                               Social Determinants of Health (SDOH) Interventions     Readmission Risk Interventions Readmission Risk Prevention Plan 12/10/2019  Medication Screening Complete  Transportation Screening Complete  Some recent data might be hidden

## 2019-12-11 NOTE — Care Management Important Message (Signed)
Important Message  Patient Details  Name: Kathleen Schneider MRN: [id number] Date of Birth: 1940/05/30   Medicare Important Message Given:  Yes     Shade Flood, LCSW 12/11/2019, 12:44 PM

## 2019-12-11 NOTE — Discharge Summary (Signed)
Physician Discharge Summary  Kathleen Schneider XTK:[id number] DOB: Dec 03, 1940 DOA: 12/09/2019  PCP: Donald Prose, MD  Admit date: 12/09/2019 Discharge date: 12/11/2019  Admitted From: Home Disposition:  SNF  Discharge Condition:Stable CODE STATUS:FULL Diet recommendation:  Regular    Brief/Interim Summary:  Patient is 79 year old female with history of CKD stage III, urinary incontinence who presents to the emergency department with progressive weakness, falls at home.  On presentation, she was found to have AKI with creatinine of 2.3.  Imagings did not show any fracture or dislocation.  UA was suggestive of possible urinary tract infection.  CK was elevated.  She was admitted for the management of AKI, rhabdomyolysis and UTI.  Her kidney function has improved with IV fluids.  Urine culture showed E. coli but she does not have any symptoms of urinary tract infection. PT/OT consulted and recommended SNF.  She is medically stable for discharge to skilled nursing facility today.  Following problems were addressed during her hospitalization:  Acute kidney injury on CKD stage III: Baseline creatinine unknown, nothing was found on the chart.  Presented with creatinine of 2.3.  Improving with IV fluids and creatinine is in the range of 1.6, which would be her baseline.  Most likely AKI was associated with rhabdomyolysis, dehydration.  Check BMP in a week  Multiple falls at home: PT/OT consulted and recommended skilled nursing facility on discharge.  She lives alone in [place] house.  Suspected UTI: Urinalysis was suggestive of possible UTI.  Urine culture showing Ecoli.  She was On ceftriaxone,changed to keflex.    Rhabdomyolysis: Improved with IV fluids.  Bilateral knee pain: Most likely secondary to osteoarthritis.  Imaging did not show any fracture/ dislocation.  Continue supportive  care.  Urinary incontinence: Takes oxybutynin at home  History of neuralgia/depression: On Lexapro and  lamotrigine.  Ulcers on bilateral knees/elbows: Superficial ulcers.  No indication for antibiotic therapy.  Wound care consulted.  Discharge Diagnoses:  Principal Problem:   ARF (acute renal failure) (HCC) Active Problems:   Fall at home, initial encounter   Acute lower UTI   Rhabdomyolysis   Dehydration   Advanced age   Bilateral knee pain    Discharge Instructions  Discharge Instructions    Diet general   Complete by: As directed    Discharge instructions   Complete by: As directed    1) Please take prescribed medications as instructed. 2)Do a Bmp test in a week   Discharge wound care:   Complete by: As directed    As wound care nurse   Increase activity slowly   Complete by: As directed      Allergies as of 12/11/2019   No Known Allergies     Medication List    STOP taking these medications   meloxicam 7.5 MG tablet Commonly known as: MOBIC     TAKE these medications   cephALEXin 500 MG capsule Commonly known as: KEFLEX Take 1 capsule (500 mg total) by mouth 3 (three) times daily for 3 days.   collagenase ointment Commonly known as: SANTYL Apply topically daily. Start taking on: December 12, 2019   escitalopram 5 MG tablet Commonly known as: LEXAPRO Take 5 mg by mouth See admin instructions. Monday, Wednesday and friday   lamoTRIgine 150 MG tablet Commonly known as: LAMICTAL TAKE 1 TABLET BY MOUTH TWICE A DAY   lidocaine 5 % Commonly known as: LIDODERM Place 1 patch onto the skin daily. Remove & Discard patch within 12 hours or as directed by  MD Start taking on: December 12, 2019 What changed: how much to take   oxybutynin 5 MG tablet Commonly known as: [place] 1 TABLET BY MOUTH TWICE A DAY            Discharge Care Instructions  (From admission, onward)         Start     Ordered   12/11/19 0000  Discharge wound care:       Comments: As wound care nurse   12/11/19 1044          Contact information for after-discharge care     Destination    [facility] .   Service: Skilled Nursing Contact information: [place] [phone]                 No Known Allergies  Consultations:  None   Procedures/Studies: US RENAL  Result Date: 12/09/2019 CLINICAL DATA:  Acute renal failure EXAM: RENAL / URINARY TRACT ULTRASOUND COMPLETE COMPARISON:  None. FINDINGS: Right Kidney: Renal measurements: 12.1 x 4.5 x 6.4 cm = volume: 183 mL. Echogenicity within normal limits. No mass. Mild hydronephrosis. Left Kidney: Renal measurements: 11.0 x 6.9 x 6.3 cm = volume: 252 mL. Echogenicity within normal limits. No mass. Moderate hydronephrosis. Bladder: Not discretely visualized/decompressed. Other: None. IMPRESSION: Moderate left hydronephrosis.  Mild right hydronephrosis. Bladder not discretely visualized/decompressed. Electronically Signed   By: Julian Hy M.D.   On: 12/09/2019 09:40   DG Knee Complete 4 Views Left  Result Date: 12/09/2019 CLINICAL DATA:  Multiple falls.  Bilateral knee pain and abrasions EXAM: LEFT KNEE - COMPLETE 4+ VIEW COMPARISON:  None. FINDINGS: No evidence of fracture, dislocation, or joint effusion. Osteopenia and atherosclerotic calcifications. IMPRESSION: No acute finding. Electronically Signed   By: Monte Fantasia M.D.   On: 12/09/2019 06:30   DG Knee Complete 4 Views Right  Result Date: 12/09/2019 CLINICAL DATA:  Recent falls.  Bilateral knee pain. EXAM: RIGHT KNEE - COMPLETE 4+ VIEW COMPARISON:  None. FINDINGS: No evidence of fracture, dislocation, or joint effusion. Osteopenic appearance. Atherosclerotic calcification in the calf and SFA. IMPRESSION: No acute finding. Electronically Signed   By: Monte Fantasia M.D.   On: 12/09/2019 06:30       Subjective: Patient seen and examined the bedside this morning.  Hemodynamically stable for discharge today.  Discharge Exam: Vitals:   12/10/19 2010 12/11/19  0620  BP: 136/72 139/65  Pulse: 79 71  Resp: 18 16  Temp: 97.9 F (36.6 C) 98 F (36.7 C)  SpO2: 97% 98%   Vitals:   12/10/19 0559 12/10/19 1204 12/10/19 2010 12/11/19 0620  BP: 116/61 108/70 136/72 139/65  Pulse: 65 76 79 71  Resp: 14 18 18 16   Temp: 97.7 F (36.5 C) 98.2 F (36.8 C) 97.9 F (36.6 C) 98 F (36.7 C)  TempSrc: Oral Oral Oral Oral  SpO2: 97% 100% 97% 98%  Weight:      Height:        General: Pt is alert, awake, not in acute distress Cardiovascular: RRR, S1/S2 +, no rubs, no gallops Respiratory: CTA bilaterally, no wheezing, no rhonchi Abdominal: Soft, NT, ND, bowel sounds + Extremities: no edema, no cyanosis    The results of significant diagnostics from this hospitalization (including imaging, microbiology, ancillary and laboratory) are listed below for reference.     Microbiology: Recent Results (from the past 240 hour(s))  Urine culture     Status: Abnormal (Preliminary result)  Collection Time: 12/09/19  6:58 AM   Specimen: Urine, Random  Result Value Ref Range Status   Specimen Description   Final    URINE, RANDOM Performed at [place] [address] [place], [place] [address]    Special Requests   Final    NONE Performed at [facility], [place] [address], [place], [place] [address]    Culture >=100,000 COLONIES/mL ESCHERICHIA COLI (A)  Final   Report Status PENDING  Incomplete  SARS Coronavirus 2 by RT PCR (hospital order, performed in [facility] hospital lab) Nasopharyngeal Nasopharyngeal Swab     Status: None   Collection Time: 12/09/19  7:49 AM   Specimen: Nasopharyngeal Swab  Result Value Ref Range Status   SARS Coronavirus 2 NEGATIVE NEGATIVE Final    Comment: (NOTE) SARS-CoV-2 target nucleic acids are NOT DETECTED.  The SARS-CoV-2 RNA is generally detectable in upper and lower respiratory specimens during the acute phase of infection. The lowest concentration of SARS-CoV-2 viral copies  this assay can detect is 250 copies / mL. A negative result does not preclude SARS-CoV-2 infection and should not be used as the sole basis for treatment or other patient management decisions.  A negative result may occur with improper specimen collection / handling, submission of specimen other than nasopharyngeal swab, presence of viral mutation(s) within the areas targeted by this assay, and inadequate number of viral copies (<250 copies / mL). A negative result must be combined with clinical observations, patient history, and epidemiological information.  Fact Sheet for Patients:   StrictlyIdeas.no  Fact Sheet for Healthcare Providers: BankingDealers.co.za  This test is not yet approved or  cleared by the [place] FDA and has been authorized for detection and/or diagnosis of SARS-CoV-2 by FDA under an Emergency Use Authorization (EUA).  This EUA will remain in effect (meaning this test can be used) for the duration of the COVID-19 declaration under Section 564(b)(1) of the Act, 21 U.S.C. section 360bbb-3(b)(1), unless the authorization is terminated or revoked sooner.  Performed at [facility], [place] [address], [place], [place] [address]      Labs: BNP (last 3 results) No results for input(s): BNP in the last 8760 hours. Basic Metabolic Panel: Recent Labs  Lab 12/09/19 0456 12/10/19 0604 12/11/19 0538  NA 145 138 142  K 4.9 3.9 4.6  CL 109 108 112*  CO2 21* 22 23  GLUCOSE 112* 97 114*  BUN 57* 32* 22  CREATININE 2.30* 1.56* 1.67*  CALCIUM 9.3 8.2* 8.6*   Liver Function Tests: Recent Labs  Lab 12/09/19 0456 12/10/19 0604  AST 63* 32  ALT 42 30  ALKPHOS 72 59  BILITOT 1.2 0.3  PROT 8.2* 5.9*  ALBUMIN 3.3* 2.4*   No results for input(s): LIPASE, AMYLASE in the last 168 hours. No results for input(s): AMMONIA in the last 168 hours. CBC: Recent Labs  Lab 12/09/19 0456 12/10/19 0604   WBC 11.7* 10.7*  NEUTROABS 9.5*  --   HGB 11.0* 9.8*  HCT 36.4 33.2*  MCV 90.1 93.0  PLT 510* 394   Cardiac Enzymes: Recent Labs  Lab 12/09/19 0456 12/10/19 0604  CKTOTAL 1,033* 348*   BNP: Invalid input(s): POCBNP CBG: No results for input(s): GLUCAP in the last 168 hours. D-Dimer No results for input(s): DDIMER in the last 72 hours. Hgb A1c No results for input(s): HGBA1C in the last 72 hours. Lipid Profile No results for input(s): CHOL, HDL, LDLCALC, TRIG, CHOLHDL, LDLDIRECT in  the last 72 hours. Thyroid function studies No results for input(s): TSH, T4TOTAL, T3FREE, THYROIDAB in the last 72 hours.  Invalid input(s): FREET3 Anemia work up No results for input(s): VITAMINB12, FOLATE, FERRITIN, TIBC, IRON, RETICCTPCT in the last 72 hours. Urinalysis    Component Value Date/Time   COLORURINE YELLOW 12/09/2019 [place] 12/09/2019 0658   LABSPEC 1.025 12/09/2019 0658   PHURINE 5.0 12/09/2019 0658   GLUCOSEU NEGATIVE 12/09/2019 0658   HGBUR LARGE (A) 12/09/2019 0658   BILIRUBINUR NEGATIVE 12/09/2019 0658   KETONESUR 5 (A) 12/09/2019 0658   PROTEINUR 30 (A) 12/09/2019 0658   NITRITE POSITIVE (A) 12/09/2019 0658   LEUKOCYTESUR LARGE (A) 12/09/2019 0658   Sepsis Labs Invalid input(s): PROCALCITONIN,  WBC,  LACTICIDVEN Microbiology Recent Results (from the past 240 hour(s))  Urine culture     Status: Abnormal (Preliminary result)   Collection Time: 12/09/19  6:58 AM   Specimen: Urine, Random  Result Value Ref Range Status   Specimen Description   Final    URINE, RANDOM Performed at [facility], [place] [address], [place], [place] [address]    Special Requests   Final    NONE Performed at [facility], [place] [address], [place], [place] [address]    Culture >=100,000 COLONIES/mL ESCHERICHIA COLI (A)  Final   Report Status PENDING  Incomplete  SARS Coronavirus 2 by RT PCR (hospital order, performed in [facility] hospital lab) Nasopharyngeal Nasopharyngeal Swab     Status: None   Collection Time: 12/09/19  7:49 AM   Specimen: Nasopharyngeal Swab  Result Value Ref Range Status   SARS Coronavirus 2 NEGATIVE NEGATIVE Final    Comment: (NOTE) SARS-CoV-2 target nucleic acids are NOT DETECTED.  The SARS-CoV-2 RNA is generally detectable in upper and lower respiratory specimens during the acute phase of infection. The lowest concentration of SARS-CoV-2 viral copies this assay can detect is 250 copies / mL. A negative result does not preclude SARS-CoV-2 infection and should not be used as the sole basis for treatment or other patient management decisions.  A negative result may occur with improper specimen collection / handling, submission of specimen other than nasopharyngeal swab, presence of viral mutation(s) within the areas targeted by this assay, and inadequate number of viral copies (<250 copies / mL). A negative result must be combined with clinical observations, patient history, and epidemiological information.  Fact Sheet for Patients:   StrictlyIdeas.no  Fact Sheet for Healthcare Providers: BankingDealers.co.za  This test is not yet approved or  cleared by the [place] FDA and has been authorized for detection and/or diagnosis of SARS-CoV-2 by FDA under an Emergency Use Authorization (EUA).  This EUA will remain in effect (meaning this test can be used) for the duration of the COVID-19 declaration under Section 564(b)(1) of the Act, 21 U.S.C. section 360bbb-3(b)(1), unless the authorization is terminated or revoked sooner.  Performed at [facility], [place] [address] [place], [place] [address]     Please note: You were cared for by a hospitalist during your hospital stay. Once you are discharged, your primary care physician will handle any further medical issues. Please note that NO REFILLS for any discharge  medications will be authorized once you are discharged, as it is imperative that you return to your primary care physician (or establish a relationship with a primary care physician if you do not have one) for your post hospital discharge needs so that they can reassess  your need for medications and monitor your lab values.    Time coordinating discharge: 40 minutes  SIGNED:   Shelly Coss, MD  [facility] 12/11/2019, 10:44 AM Pager [phone]  If 7PM-7AM, please contact night-coverage [email or web address] Password [email or web address]

## 2019-12-14 DIAGNOSIS — E119 Type 2 diabetes mellitus without complications: Secondary | ICD-10-CM | POA: Diagnosis not present

## 2019-12-14 DIAGNOSIS — R32 Unspecified urinary incontinence: Secondary | ICD-10-CM | POA: Diagnosis not present

## 2019-12-14 DIAGNOSIS — N390 Urinary tract infection, site not specified: Secondary | ICD-10-CM | POA: Diagnosis not present

## 2019-12-14 DIAGNOSIS — N179 Acute kidney failure, unspecified: Secondary | ICD-10-CM | POA: Diagnosis not present

## 2019-12-14 DIAGNOSIS — I10 Essential (primary) hypertension: Secondary | ICD-10-CM | POA: Diagnosis not present

## 2019-12-14 DIAGNOSIS — N183 Chronic kidney disease, stage 3 unspecified: Secondary | ICD-10-CM | POA: Diagnosis not present

## 2019-12-16 DIAGNOSIS — N179 Acute kidney failure, unspecified: Secondary | ICD-10-CM | POA: Diagnosis not present

## 2019-12-16 DIAGNOSIS — N390 Urinary tract infection, site not specified: Secondary | ICD-10-CM | POA: Diagnosis not present

## 2019-12-16 DIAGNOSIS — M6282 Rhabdomyolysis: Secondary | ICD-10-CM | POA: Diagnosis not present

## 2019-12-17 DIAGNOSIS — R32 Unspecified urinary incontinence: Secondary | ICD-10-CM | POA: Diagnosis not present

## 2019-12-17 DIAGNOSIS — E119 Type 2 diabetes mellitus without complications: Secondary | ICD-10-CM | POA: Diagnosis not present

## 2019-12-17 DIAGNOSIS — F329 Major depressive disorder, single episode, unspecified: Secondary | ICD-10-CM | POA: Diagnosis not present

## 2019-12-17 DIAGNOSIS — N179 Acute kidney failure, unspecified: Secondary | ICD-10-CM | POA: Diagnosis not present

## 2019-12-17 DIAGNOSIS — N390 Urinary tract infection, site not specified: Secondary | ICD-10-CM | POA: Diagnosis not present

## 2019-12-17 DIAGNOSIS — I10 Essential (primary) hypertension: Secondary | ICD-10-CM | POA: Diagnosis not present

## 2019-12-17 DIAGNOSIS — N183 Chronic kidney disease, stage 3 unspecified: Secondary | ICD-10-CM | POA: Diagnosis not present

## 2019-12-21 ENCOUNTER — Other Ambulatory Visit: Payer: Self-pay | Admitting: Neurology

## 2019-12-22 DIAGNOSIS — F339 Major depressive disorder, recurrent, unspecified: Secondary | ICD-10-CM | POA: Diagnosis not present

## 2020-01-07 DIAGNOSIS — N183 Chronic kidney disease, stage 3 unspecified: Secondary | ICD-10-CM | POA: Diagnosis not present

## 2020-01-07 DIAGNOSIS — I10 Essential (primary) hypertension: Secondary | ICD-10-CM | POA: Diagnosis not present

## 2020-01-07 DIAGNOSIS — E119 Type 2 diabetes mellitus without complications: Secondary | ICD-10-CM | POA: Diagnosis not present

## 2020-01-07 DIAGNOSIS — R110 Nausea: Secondary | ICD-10-CM | POA: Diagnosis not present

## 2020-01-07 DIAGNOSIS — F329 Major depressive disorder, single episode, unspecified: Secondary | ICD-10-CM | POA: Diagnosis not present

## 2020-01-09 DIAGNOSIS — E119 Type 2 diabetes mellitus without complications: Secondary | ICD-10-CM | POA: Diagnosis not present

## 2020-01-09 DIAGNOSIS — R110 Nausea: Secondary | ICD-10-CM | POA: Diagnosis not present

## 2020-01-09 DIAGNOSIS — F329 Major depressive disorder, single episode, unspecified: Secondary | ICD-10-CM | POA: Diagnosis not present

## 2020-01-15 DIAGNOSIS — R41841 Cognitive communication deficit: Secondary | ICD-10-CM | POA: Diagnosis not present

## 2020-01-15 DIAGNOSIS — R279 Unspecified lack of coordination: Secondary | ICD-10-CM | POA: Diagnosis not present

## 2020-01-15 DIAGNOSIS — R278 Other lack of coordination: Secondary | ICD-10-CM | POA: Diagnosis not present

## 2020-01-15 DIAGNOSIS — M25561 Pain in right knee: Secondary | ICD-10-CM | POA: Diagnosis not present

## 2020-01-15 DIAGNOSIS — M6282 Rhabdomyolysis: Secondary | ICD-10-CM | POA: Diagnosis not present

## 2020-01-15 DIAGNOSIS — R2689 Other abnormalities of gait and mobility: Secondary | ICD-10-CM | POA: Diagnosis not present

## 2020-01-15 DIAGNOSIS — R296 Repeated falls: Secondary | ICD-10-CM | POA: Diagnosis not present

## 2020-01-15 DIAGNOSIS — M25562 Pain in left knee: Secondary | ICD-10-CM | POA: Diagnosis not present

## 2020-01-29 DIAGNOSIS — B0229 Other postherpetic nervous system involvement: Secondary | ICD-10-CM | POA: Diagnosis not present

## 2020-01-29 DIAGNOSIS — F330 Major depressive disorder, recurrent, mild: Secondary | ICD-10-CM | POA: Diagnosis not present

## 2020-01-29 DIAGNOSIS — R296 Repeated falls: Secondary | ICD-10-CM | POA: Diagnosis not present

## 2020-01-29 DIAGNOSIS — E669 Obesity, unspecified: Secondary | ICD-10-CM | POA: Diagnosis not present

## 2020-02-03 ENCOUNTER — Inpatient Hospital Stay (HOSPITAL_COMMUNITY)
Admission: EM | Admit: 2020-02-03 | Discharge: 2020-02-18 | DRG: 871 | Disposition: A | Discharge status: Home Health | Payer: PPO | Attending: Internal Medicine | Admitting: Internal Medicine

## 2020-02-03 ENCOUNTER — Emergency Department (HOSPITAL_COMMUNITY): Payer: PPO

## 2020-02-03 ENCOUNTER — Encounter (HOSPITAL_COMMUNITY): Payer: Self-pay

## 2020-02-03 ENCOUNTER — Other Ambulatory Visit: Payer: Self-pay

## 2020-02-03 DIAGNOSIS — Z66 Do not resuscitate: Secondary | ICD-10-CM | POA: Diagnosis not present

## 2020-02-03 DIAGNOSIS — R9389 Abnormal findings on diagnostic imaging of other specified body structures: Secondary | ICD-10-CM | POA: Diagnosis not present

## 2020-02-03 DIAGNOSIS — W06XXXA Fall from bed, initial encounter: Secondary | ICD-10-CM | POA: Diagnosis present

## 2020-02-03 DIAGNOSIS — R0602 Shortness of breath: Secondary | ICD-10-CM

## 2020-02-03 DIAGNOSIS — M899 Disorder of bone, unspecified: Secondary | ICD-10-CM | POA: Diagnosis not present

## 2020-02-03 DIAGNOSIS — N179 Acute kidney failure, unspecified: Secondary | ICD-10-CM | POA: Diagnosis not present

## 2020-02-03 DIAGNOSIS — R279 Unspecified lack of coordination: Secondary | ICD-10-CM | POA: Diagnosis not present

## 2020-02-03 DIAGNOSIS — R5381 Other malaise: Secondary | ICD-10-CM | POA: Diagnosis present

## 2020-02-03 DIAGNOSIS — R197 Diarrhea, unspecified: Secondary | ICD-10-CM | POA: Diagnosis not present

## 2020-02-03 DIAGNOSIS — M48061 Spinal stenosis, lumbar region without neurogenic claudication: Secondary | ICD-10-CM | POA: Diagnosis not present

## 2020-02-03 DIAGNOSIS — Z7189 Other specified counseling: Secondary | ICD-10-CM

## 2020-02-03 DIAGNOSIS — D72829 Elevated white blood cell count, unspecified: Secondary | ICD-10-CM | POA: Diagnosis present

## 2020-02-03 DIAGNOSIS — R0689 Other abnormalities of breathing: Secondary | ICD-10-CM | POA: Diagnosis not present

## 2020-02-03 DIAGNOSIS — E871 Hypo-osmolality and hyponatremia: Secondary | ICD-10-CM | POA: Diagnosis present

## 2020-02-03 DIAGNOSIS — R000 Tachycardia, unspecified: Secondary | ICD-10-CM | POA: Diagnosis not present

## 2020-02-03 DIAGNOSIS — I248 Other forms of acute ischemic heart disease: Secondary | ICD-10-CM | POA: Diagnosis not present

## 2020-02-03 DIAGNOSIS — Z515 Encounter for palliative care: Secondary | ICD-10-CM | POA: Diagnosis not present

## 2020-02-03 DIAGNOSIS — Z9049 Acquired absence of other specified parts of digestive tract: Secondary | ICD-10-CM

## 2020-02-03 DIAGNOSIS — R52 Pain, unspecified: Secondary | ICD-10-CM

## 2020-02-03 DIAGNOSIS — R296 Repeated falls: Secondary | ICD-10-CM | POA: Diagnosis present

## 2020-02-03 DIAGNOSIS — D631 Anemia in chronic kidney disease: Secondary | ICD-10-CM | POA: Diagnosis present

## 2020-02-03 DIAGNOSIS — Z79899 Other long term (current) drug therapy: Secondary | ICD-10-CM

## 2020-02-03 DIAGNOSIS — E861 Hypovolemia: Secondary | ICD-10-CM | POA: Diagnosis not present

## 2020-02-03 DIAGNOSIS — M898X8 Other specified disorders of bone, other site: Secondary | ICD-10-CM | POA: Diagnosis not present

## 2020-02-03 DIAGNOSIS — R58 Hemorrhage, not elsewhere classified: Secondary | ICD-10-CM | POA: Diagnosis not present

## 2020-02-03 DIAGNOSIS — E876 Hypokalemia: Secondary | ICD-10-CM | POA: Diagnosis not present

## 2020-02-03 DIAGNOSIS — N1832 Chronic kidney disease, stage 3b: Secondary | ICD-10-CM | POA: Diagnosis not present

## 2020-02-03 DIAGNOSIS — E860 Dehydration: Secondary | ICD-10-CM | POA: Diagnosis not present

## 2020-02-03 DIAGNOSIS — C7952 Secondary malignant neoplasm of bone marrow: Secondary | ICD-10-CM | POA: Diagnosis not present

## 2020-02-03 DIAGNOSIS — J9601 Acute respiratory failure with hypoxia: Secondary | ICD-10-CM | POA: Diagnosis present

## 2020-02-03 DIAGNOSIS — D649 Anemia, unspecified: Secondary | ICD-10-CM | POA: Diagnosis not present

## 2020-02-03 DIAGNOSIS — Y92009 Unspecified place in unspecified non-institutional (private) residence as the place of occurrence of the external cause: Secondary | ICD-10-CM

## 2020-02-03 DIAGNOSIS — M160 Bilateral primary osteoarthritis of hip: Secondary | ICD-10-CM | POA: Diagnosis not present

## 2020-02-03 DIAGNOSIS — N1330 Unspecified hydronephrosis: Secondary | ICD-10-CM | POA: Diagnosis present

## 2020-02-03 DIAGNOSIS — C50919 Malignant neoplasm of unspecified site of unspecified female breast: Secondary | ICD-10-CM | POA: Diagnosis not present

## 2020-02-03 DIAGNOSIS — I130 Hypertensive heart and chronic kidney disease with heart failure and stage 1 through stage 4 chronic kidney disease, or unspecified chronic kidney disease: Secondary | ICD-10-CM | POA: Diagnosis present

## 2020-02-03 DIAGNOSIS — I5032 Chronic diastolic (congestive) heart failure: Secondary | ICD-10-CM | POA: Diagnosis present

## 2020-02-03 DIAGNOSIS — J189 Pneumonia, unspecified organism: Secondary | ICD-10-CM | POA: Diagnosis present

## 2020-02-03 DIAGNOSIS — A419 Sepsis, unspecified organism: Principal | ICD-10-CM | POA: Diagnosis present

## 2020-02-03 DIAGNOSIS — Z20822 Contact with and (suspected) exposure to covid-19: Secondary | ICD-10-CM | POA: Diagnosis present

## 2020-02-03 DIAGNOSIS — I340 Nonrheumatic mitral (valve) insufficiency: Secondary | ICD-10-CM | POA: Diagnosis not present

## 2020-02-03 DIAGNOSIS — F32A Depression, unspecified: Secondary | ICD-10-CM | POA: Diagnosis present

## 2020-02-03 DIAGNOSIS — W19XXXA Unspecified fall, initial encounter: Secondary | ICD-10-CM

## 2020-02-03 DIAGNOSIS — F419 Anxiety disorder, unspecified: Secondary | ICD-10-CM | POA: Diagnosis present

## 2020-02-03 DIAGNOSIS — J9811 Atelectasis: Secondary | ICD-10-CM | POA: Diagnosis not present

## 2020-02-03 DIAGNOSIS — R7989 Other specified abnormal findings of blood chemistry: Secondary | ICD-10-CM | POA: Diagnosis present

## 2020-02-03 DIAGNOSIS — C801 Malignant (primary) neoplasm, unspecified: Secondary | ICD-10-CM | POA: Diagnosis not present

## 2020-02-03 DIAGNOSIS — Z743 Need for continuous supervision: Secondary | ICD-10-CM | POA: Diagnosis not present

## 2020-02-03 DIAGNOSIS — Z87891 Personal history of nicotine dependence: Secondary | ICD-10-CM

## 2020-02-03 DIAGNOSIS — J90 Pleural effusion, not elsewhere classified: Secondary | ICD-10-CM | POA: Diagnosis not present

## 2020-02-03 DIAGNOSIS — C7951 Secondary malignant neoplasm of bone: Secondary | ICD-10-CM | POA: Diagnosis present

## 2020-02-03 DIAGNOSIS — D630 Anemia in neoplastic disease: Secondary | ICD-10-CM | POA: Diagnosis present

## 2020-02-03 DIAGNOSIS — E872 Acidosis: Secondary | ICD-10-CM | POA: Diagnosis present

## 2020-02-03 DIAGNOSIS — R4189 Other symptoms and signs involving cognitive functions and awareness: Secondary | ICD-10-CM | POA: Diagnosis present

## 2020-02-03 DIAGNOSIS — R609 Edema, unspecified: Secondary | ICD-10-CM

## 2020-02-03 DIAGNOSIS — M8450XA Pathological fracture in neoplastic disease, unspecified site, initial encounter for fracture: Secondary | ICD-10-CM

## 2020-02-03 DIAGNOSIS — E669 Obesity, unspecified: Secondary | ICD-10-CM | POA: Diagnosis not present

## 2020-02-03 DIAGNOSIS — Z6836 Body mass index (BMI) 36.0-36.9, adult: Secondary | ICD-10-CM

## 2020-02-03 DIAGNOSIS — Z87440 Personal history of urinary (tract) infections: Secondary | ICD-10-CM

## 2020-02-03 DIAGNOSIS — R918 Other nonspecific abnormal finding of lung field: Secondary | ICD-10-CM | POA: Diagnosis not present

## 2020-02-03 DIAGNOSIS — R0902 Hypoxemia: Secondary | ICD-10-CM | POA: Diagnosis not present

## 2020-02-03 DIAGNOSIS — I959 Hypotension, unspecified: Secondary | ICD-10-CM | POA: Diagnosis not present

## 2020-02-03 HISTORY — DX: Essential (primary) hypertension: I10

## 2020-02-03 LAB — CBC WITH DIFFERENTIAL/PLATELET
Abs Immature Granulocytes: 0.26 10*3/uL — ABNORMAL HIGH (ref 0.00–0.07)
Basophils Absolute: 0 10*3/uL (ref 0.0–0.1)
Basophils Relative: 0 %
Eosinophils Absolute: 0.1 10*3/uL (ref 0.0–0.5)
Eosinophils Relative: 0 %
HCT: 35.8 % — ABNORMAL LOW (ref 36.0–46.0)
Hemoglobin: 11.3 g/dL — ABNORMAL LOW (ref 12.0–15.0)
Immature Granulocytes: 1 %
Lymphocytes Relative: 7 %
Lymphs Abs: 1.2 10*3/uL (ref 0.7–4.0)
MCH: 28.5 pg (ref 26.0–34.0)
MCHC: 31.6 g/dL (ref 30.0–36.0)
MCV: 90.4 fL (ref 80.0–100.0)
Monocytes Absolute: 0.2 10*3/uL (ref 0.1–1.0)
Monocytes Relative: 1 %
Neutro Abs: 16.4 10*3/uL — ABNORMAL HIGH (ref 1.7–7.7)
Neutrophils Relative %: 91 %
Platelets: 238 10*3/uL (ref 150–400)
RBC: 3.96 MIL/uL (ref 3.87–5.11)
RDW: 16 % — ABNORMAL HIGH (ref 11.5–15.5)
WBC: 18.2 10*3/uL — ABNORMAL HIGH (ref 4.0–10.5)
nRBC: 0 % (ref 0.0–0.2)

## 2020-02-03 LAB — URINALYSIS, ROUTINE W REFLEX MICROSCOPIC
Bilirubin Urine: NEGATIVE
Glucose, UA: NEGATIVE mg/dL
Ketones, ur: 5 mg/dL — AB
Nitrite: NEGATIVE
Protein, ur: 100 mg/dL — AB
Specific Gravity, Urine: 1.02 (ref 1.005–1.030)
pH: 5 (ref 5.0–8.0)

## 2020-02-03 LAB — RESP PANEL BY RT PCR (RSV, FLU A&B, COVID)
Influenza A by PCR: NEGATIVE
Influenza B by PCR: NEGATIVE
Respiratory Syncytial Virus by PCR: NEGATIVE
SARS Coronavirus 2 by RT PCR: NEGATIVE

## 2020-02-03 LAB — COMPREHENSIVE METABOLIC PANEL
ALT: 20 U/L (ref 0–44)
AST: 23 U/L (ref 15–41)
Albumin: 3.1 g/dL — ABNORMAL LOW (ref 3.5–5.0)
Alkaline Phosphatase: 187 U/L — ABNORMAL HIGH (ref 38–126)
Anion gap: 14 (ref 5–15)
BUN: 37 mg/dL — ABNORMAL HIGH (ref 8–23)
CO2: 22 mmol/L (ref 22–32)
Calcium: 8.5 mg/dL — ABNORMAL LOW (ref 8.9–10.3)
Chloride: 96 mmol/L — ABNORMAL LOW (ref 98–111)
Creatinine, Ser: 2.05 mg/dL — ABNORMAL HIGH (ref 0.44–1.00)
GFR, Estimated: 22 mL/min — ABNORMAL LOW (ref >60)
Glucose, Bld: 139 mg/dL — ABNORMAL HIGH (ref 70–99)
Potassium: 3.2 mmol/L — ABNORMAL LOW (ref 3.5–5.1)
Sodium: 132 mmol/L — ABNORMAL LOW (ref 135–145)
Total Bilirubin: 1.2 mg/dL (ref 0.3–1.2)
Total Protein: 7.7 g/dL (ref 6.5–8.1)

## 2020-02-03 LAB — CK: Total CK: 60 U/L (ref 38–234)

## 2020-02-03 LAB — BRAIN NATRIURETIC PEPTIDE: B Natriuretic Peptide: 382.3 pg/mL — ABNORMAL HIGH (ref 0.0–100.0)

## 2020-02-03 MED ORDER — SODIUM CHLORIDE 0.9 % IV SOLN
1.0000 g | Freq: Once | INTRAVENOUS | Status: AC
  Administered 2020-02-03: 1 g via INTRAVENOUS
  Filled 2020-02-03: qty 10

## 2020-02-03 MED ORDER — SODIUM CHLORIDE 0.9 % IV BOLUS
1000.0000 mL | Freq: Once | INTRAVENOUS | Status: AC
  Administered 2020-02-03: 1000 mL via INTRAVENOUS

## 2020-02-03 MED ORDER — SODIUM CHLORIDE 0.9 % IV SOLN
500.0000 mg | Freq: Once | INTRAVENOUS | Status: AC
  Administered 2020-02-03: 500 mg via INTRAVENOUS
  Filled 2020-02-03: qty 500

## 2020-02-03 NOTE — ED Triage Notes (Signed)
Cough and SOB x 1 month. Called 911 today for a fall. Pt was found on the ground by EMS. States she fell out of bed and was down for apprx 3 hours. Denies head injury. Denies blood thinners. 91% on RA. Given 125mg  solu-medrol and 2 duo nebs en route.

## 2020-02-03 NOTE — ED Provider Notes (Signed)
[place] DEPT Provider Note   CSN: [id number] Arrival date & time: 02/03/20  1758     History Chief Complaint  Patient presents with   Shortness of Breath    Kathleen Schneider is a 79 y.o. female.  HPI   Patient presents after a fall, earlier today. Patient acknowledges multiple medical issues including prior cigarette addiction.  She notes that she has been weaker than usual, with dyspnea possibly for as long as 1 month, though this is unclear.  She takes no blood thinning medication.  Today, the patient notes that she was reaching for something, fell to the ground.  She was on the ground for approximately 3 hours prior to EMS finding her. EMS providers note that the patient was hypoxic, 91% on room air.  She received Solu-Medrol, nebs in route.  Patient denies chest pain, dyspnea, focal weakness, head pain, neck pain, acknowledges generalized discomfort, as well as generalized weakness as above.  Past Medical History:  Diagnosis Date   HTN (hypertension)    Vision abnormalities     Patient Active Problem List   Diagnosis Date Noted   ARF (acute renal failure) ([place]) 12/09/2019   Fall at home, initial encounter 12/09/2019   Acute lower UTI 12/09/2019   Rhabdomyolysis 12/09/2019   Dehydration 12/09/2019   Advanced age 06/11/2019   Bilateral knee pain 12/09/2019   Facet joint syndrome 05/13/2017   Spinal stenosis, lumbar 09/25/2016   Lower back pain 03/27/2016   Excessive daytime sleepiness 05/24/2015   Postherpetic neuralgia 02/18/2015   Dysesthesia 02/18/2015   Insomnia 02/18/2015   Snoring 02/18/2015    Past Surgical History:  Procedure Laterality Date   CHOLECYSTECTOMY, LAPAROSCOPIC       OB History   No obstetric history on file.     Family History  Problem Relation Age of Onset   Seizures Mother    Alcoholism Father     Social History   Tobacco Use   Smoking status: Former Smoker   Smokeless  tobacco: Never Used  Substance Use Topics   Alcohol use: Yes    Alcohol/week: 0.0 standard drinks    Comment: Rare--about twice per yr/fim   Drug use: No    Home Medications Prior to Admission medications   Medication Sig Start Date End Date Taking? Authorizing Provider  collagenase (SANTYL) ointment Apply topically daily. 12/12/19   Shelly Coss, MD  escitalopram (LEXAPRO) 5 MG tablet Take 5 mg by mouth See admin instructions. Monday, Wednesday and friday 11/27/19   [provider]  lamoTRIgine (LAMICTAL) 150 MG tablet TAKE 1 TABLET BY MOUTH TWICE A DAY Patient taking differently: Take 150 mg by mouth 2 (two) times daily.  12/07/19   Sater, Nanine Means, MD  lidocaine (LIDODERM) 5 % Place 1 patch onto the skin daily. Remove & Discard patch within 12 hours or as directed by MD 12/12/19   Shelly Coss, MD  oxybutynin (DITROPAN) 5 MG tablet TAKE 1 TABLET BY MOUTH TWICE A DAY Patient taking differently: Take 5 mg by mouth 2 (two) times daily.  11/23/19   Sater, Nanine Means, MD    Allergies    Patient has no known allergies.  Review of Systems   Review of Systems  Constitutional:       Per HPI, otherwise negative  HENT:       Per HPI, otherwise negative  Respiratory:       Per HPI, otherwise negative  Cardiovascular:       Per  HPI, otherwise negative  Gastrointestinal: Negative for vomiting.  Endocrine:       Negative aside from HPI  Genitourinary:       Neg aside from HPI   Musculoskeletal:       Per HPI, otherwise negative  Skin: Negative.   Neurological: Positive for weakness. Negative for syncope.    Physical Exam Updated Vital Signs BP (!) 130/59    Pulse (!) 108    Temp 98.1 F (36.7 C) (Oral)    Resp (!) 24    SpO2 100%   Physical Exam Vitals and nursing note reviewed.  Constitutional:      General: She is not in acute distress.    Appearance: She is well-developed.  HENT:     Head: Normocephalic and atraumatic.  Eyes:     Conjunctiva/sclera:  Conjunctivae normal.  Cardiovascular:     Rate and Rhythm: Normal rate and regular rhythm.  Pulmonary:     Effort: Pulmonary effort is normal.     Breath sounds: Decreased breath sounds present.     Comments: Patient received nebs and steroids prior to ED arrival Abdominal:     General: There is no distension.  Skin:    General: Skin is warm and dry.  Neurological:     Mental Status: She is alert and oriented to person, place, and time.     Cranial Nerves: No cranial nerve deficit.     Motor: Atrophy present.     ED Results / Procedures / Treatments   Labs (all labs ordered are listed, but only abnormal results are displayed) Labs Reviewed  COMPREHENSIVE METABOLIC PANEL - Abnormal; Notable for the following components:      Result Value   Sodium 132 (*)    Potassium 3.2 (*)    Chloride 96 (*)    Glucose, Bld 139 (*)    BUN 37 (*)    Creatinine, Ser 2.05 (*)    Calcium 8.5 (*)    Albumin 3.1 (*)    Alkaline Phosphatase 187 (*)    GFR, Estimated 22 (*)    All other components within normal limits  CBC WITH DIFFERENTIAL/PLATELET - Abnormal; Notable for the following components:   WBC 18.2 (*)    Hemoglobin 11.3 (*)    HCT 35.8 (*)    RDW 16.0 (*)    Neutro Abs 16.4 (*)    Abs Immature Granulocytes 0.26 (*)    All other components within normal limits  BRAIN NATRIURETIC PEPTIDE - Abnormal; Notable for the following components:   B Natriuretic Peptide 382.3 (*)    All other components within normal limits  URINALYSIS, ROUTINE W REFLEX MICROSCOPIC - Abnormal; Notable for the following components:   Color, Urine AMBER (*)    APPearance HAZY (*)    Hgb urine dipstick MODERATE (*)    Ketones, ur 5 (*)    Protein, ur 100 (*)    Leukocytes,Ua TRACE (*)    Bacteria, UA MANY (*)    All other components within normal limits  RESP PANEL BY RT PCR (RSV, FLU A&B, COVID)  CK    EKG None  Radiology DG Chest Port 1 View  Result Date: 02/03/2020 CLINICAL DATA:  Shortness  of breath and cough for 1 month EXAM: PORTABLE CHEST 1 VIEW COMPARISON:  None. FINDINGS: Cardiac shadow is at the upper limits of normal in size. Aortic calcifications are seen. The lungs are well aerated bilaterally with mild left basilar atelectasis/early infiltrate. No bony abnormality is noted.  IMPRESSION: Left basilar opacity consistent with atelectasis/infiltrate. Electronically Signed   By: Inez Catalina M.D.   On: 02/03/2020 20:06    Procedures Procedures (including critical care time)  Medications Ordered in ED Medications  sodium chloride 0.9 % bolus 1,000 mL (1,000 mLs Intravenous New Bag/Given 02/03/20 2009)    ED Course  I have reviewed the triage vital signs and the nursing notes.  Pertinent labs & imaging results that were available during my care of the patient were reviewed by me and considered in my medical decision making (see chart for details).   9:09 PM Patient now committed by a female companion. This companion assists with the history, noting the patient does not have a known history of heart failure, does not typically wear oxygen. The patient continues to require 3 L via nasal cannula here to achieve appropriate saturation. Patient notes that she feels somewhat better having received initial fluid resuscitation.  Line patient is initial labs were noted for slight elevation in creatinine, though not as severe as her most recent admission, still elevated from baseline with elevated BUN considering mild dehydration.  On however, the patient also found to have elevated BNP, 300 as well as x-ray concerning for pneumonia, consistent with the patient's white count of 818[address].  Patient's weakness, fall, dyspnea, likely multifactorial, with consideration of new heart failure, pneumonia, COPD. Patient received Solu-Medrol prior to ED arrival, has received antibiotics, will require admission for further monitoring, management. Final Clinical Impression(s) / ED Diagnoses Final  diagnoses:  Community acquired pneumonia of left lower lobe of lung  Fall, initial encounter     Carmin Muskrat, MD 02/03/20 2113

## 2020-02-03 NOTE — H&P (Signed)
History and Physical   Kathleen Schneider HDQ:[id number] DOB: 11/13/1940 DOA: 02/03/2020  Referring MD/NP/PA: Dr. Vanita Panda  PCP: Donald Prose, MD   Outpatient Specialists: None  Patient coming from: Home  Chief Complaint: Shortness of breath  HPI: Kathleen Schneider is a 79 y.o. female with medical history significant of hypertension, recurrent UTI, chronic kidney disease stage III, excessive daytime sleepiness, gait abnormalities, visual changes, who came in from home with shortness of breath and weakness.  Patient noticed progressive shortness of breath over the last 3 to 4 days.  Associated with some cough.  She felt very weak.  No fever or chills.  She has been weak that she has been mostly in bed.  She came to the ER where she was evaluated.  Patient was noted to be mildly hypoxic.  She also requires about 2 L of oxygen now.  She is COVID-19 negative but has evidence of pneumonia versus CHF.  Her BNP is elevated but also has leukocytosis and infiltrates that could be technically speaking and pneumonia.  She is got elevated creatinine which is slightly above her baseline.  She is therefore being admitted to the hospital for acute respiratory failure probably pneumonia but could be CHF as well..  ED Course: Temperature 98.1 blood pressure 130/59 pulse 108 respiratory rate of 24 oxygen sats 93% on 2 L.  White count is 18.2, hemoglobin 11.3, platelets 238 sodium 132 potassium 3.2 chloride 96 CO2 22 BUN 37 creatinine 2.05 and calcium 8.5.  Glucose 139.  COVID-19 screen is negative.  Urinalysis showed many bacteria with WBC 21-50 but negative leukocytes and nitrite.  Probably asymptomatic bacteriuria.  Chest x-ray showed left basilar opacity consistent with atelectasis versus infiltrate.  BNP is 382.  Patient being admitted with pneumonia as well as suspected cardiac cause.  Review of Systems: As per HPI otherwise 10 point review of systems negative.    Past Medical History:  Diagnosis Date  . HTN  (hypertension)   . Vision abnormalities     Past Surgical History:  Procedure Laterality Date  . CHOLECYSTECTOMY, LAPAROSCOPIC       reports that she has quit smoking. She has never used smokeless tobacco. She reports current alcohol use. She reports that she does not use drugs.  No Known Allergies  Family History  Problem Relation Age of Onset  . Seizures Mother   . Alcoholism Father      Prior to Admission medications   Medication Sig Start Date End Date Taking? Authorizing Provider  collagenase (SANTYL) ointment Apply topically daily. 12/12/19   Shelly Coss, MD  escitalopram (LEXAPRO) 5 MG tablet Take 5 mg by mouth See admin instructions. Monday, Wednesday and friday 11/27/19   [provider]  lamoTRIgine (LAMICTAL) 150 MG tablet TAKE 1 TABLET BY MOUTH TWICE A DAY Patient taking differently: Take 150 mg by mouth 2 (two) times daily.  12/07/19   Sater, Nanine Means, MD  lidocaine (LIDODERM) 5 % Place 1 patch onto the skin daily. Remove & Discard patch within 12 hours or as directed by MD 12/12/19   Shelly Coss, MD  oxybutynin (DITROPAN) 5 MG tablet TAKE 1 TABLET BY MOUTH TWICE A DAY Patient taking differently: Take 5 mg by mouth 2 (two) times daily.  11/23/19   Britt Bottom, MD    Physical Exam: Vitals:   02/03/20 1813 02/03/20 2100  BP: (!) 130/59 116/63  Pulse: (!) 108 84  Resp: (!) 24 20  Temp: 98.1 F (36.7 C)  TempSrc: Oral   SpO2: 100% 93%      Constitutional: Acutely ill looking, no distress Vitals:   02/03/20 1813 02/03/20 2100  BP: (!) 130/59 116/63  Pulse: (!) 108 84  Resp: (!) 24 20  Temp: 98.1 F (36.7 C)   TempSrc: Oral   SpO2: 100% 93%   Eyes: PERRL, lids and conjunctivae normal ENMT: Mucous membranes are moist. Posterior pharynx clear of any exudate or lesions.Normal dentition.  Neck: normal, supple, no masses, no thyromegaly Respiratory: Coarse breath sound, no crackles no wheeze, some rhonchi normal respiratory effort. No  accessory muscle use.  Cardiovascular: Sinus tachycardia, no murmurs / rubs / gallops. No extremity edema. 2+ pedal pulses. No carotid bruits.  Abdomen: no tenderness, no masses palpated. No hepatosplenomegaly. Bowel sounds positive.  Musculoskeletal: no clubbing / cyanosis. No joint deformity upper and lower extremities. Good ROM, no contractures. Normal muscle tone.  Skin: no rashes, lesions, ulcers. No induration Neurologic: CN 2-12 grossly intact. Sensation intact, DTR normal. Strength 5/5 in all 4.  Psychiatric: Normal judgment and insight. Alert and oriented x 3. Normal mood.     Labs on Admission: I have personally reviewed following labs and imaging studies  CBC: Recent Labs  Lab 02/03/20 1820  WBC 18.2*  NEUTROABS 16.4*  HGB 11.3*  HCT 35.8*  MCV 90.4  PLT 607   Basic Metabolic Panel: Recent Labs  Lab 02/03/20 1820  NA 132*  K 3.2*  CL 96*  CO2 22  GLUCOSE 139*  BUN 37*  CREATININE 2.05*  CALCIUM 8.5*   GFR: CrCl cannot be calculated (Unknown ideal weight.). Liver Function Tests: Recent Labs  Lab 02/03/20 1820  AST 23  ALT 20  ALKPHOS 187*  BILITOT 1.2  PROT 7.7  ALBUMIN 3.1*   No results for input(s): LIPASE, AMYLASE in the last 168 hours. No results for input(s): AMMONIA in the last 168 hours. Coagulation Profile: No results for input(s): INR, PROTIME in the last 168 hours. Cardiac Enzymes: Recent Labs  Lab 02/03/20 1820  CKTOTAL 60   BNP (last 3 results) No results for input(s): PROBNP in the last 8760 hours. HbA1C: No results for input(s): HGBA1C in the last 72 hours. CBG: No results for input(s): GLUCAP in the last 168 hours. Lipid Profile: No results for input(s): CHOL, HDL, LDLCALC, TRIG, CHOLHDL, LDLDIRECT in the last 72 hours. Thyroid Function Tests: No results for input(s): TSH, T4TOTAL, FREET4, T3FREE, THYROIDAB in the last 72 hours. Anemia Panel: No results for input(s): VITAMINB12, FOLATE, FERRITIN, TIBC, IRON, RETICCTPCT in  the last 72 hours. Urine analysis:    Component Value Date/Time   COLORURINE AMBER (A) 02/03/2020 1812   APPEARANCEUR HAZY (A) 02/03/2020 1812   LABSPEC 1.020 02/03/2020 1812   PHURINE 5.0 02/03/2020 1812   GLUCOSEU NEGATIVE 02/03/2020 1812   HGBUR MODERATE (A) 02/03/2020 1812   BILIRUBINUR NEGATIVE 02/03/2020 1812   KETONESUR 5 (A) 02/03/2020 1812   PROTEINUR 100 (A) 02/03/2020 1812   NITRITE NEGATIVE 02/03/2020 1812   LEUKOCYTESUR TRACE (A) 02/03/2020 1812   Sepsis Labs: @LABRCNTIP (procalcitonin:4,lacticidven:4) ) Recent Results (from the past 240 hour(s))  Resp Panel by RT PCR (RSV, Flu A&B, Covid) - Nasopharyngeal Swab     Status: None   Collection Time: 02/03/20  6:25 PM   Specimen: Nasopharyngeal Swab  Result Value Ref Range Status   SARS Coronavirus 2 by RT PCR NEGATIVE NEGATIVE Final    Comment: (NOTE) SARS-CoV-2 target nucleic acids are NOT DETECTED.  The SARS-CoV-2 RNA is generally  detectable in upper respiratoy specimens during the acute phase of infection. The lowest concentration of SARS-CoV-2 viral copies this assay can detect is 131 copies/mL. A negative result does not preclude SARS-Cov-2 infection and should not be used as the sole basis for treatment or other patient management decisions. A negative result may occur with  improper specimen collection/handling, submission of specimen other than nasopharyngeal swab, presence of viral mutation(s) within the areas targeted by this assay, and inadequate number of viral copies (<131 copies/mL). A negative result must be combined with clinical observations, patient history, and epidemiological information. The expected result is Negative.  Fact Sheet for Patients:  PinkCheek.be  Fact Sheet for Healthcare Providers:  GravelBags.it  This test is no t yet approved or cleared by the [place] FDA and  has been authorized for detection and/or diagnosis  of SARS-CoV-2 by FDA under an Emergency Use Authorization (EUA). This EUA will remain  in effect (meaning this test can be used) for the duration of the COVID-19 declaration under Section 564(b)(1) of the Act, 21 U.S.C. section 360bbb-3(b)(1), unless the authorization is terminated or revoked sooner.     Influenza A by PCR NEGATIVE NEGATIVE Final   Influenza B by PCR NEGATIVE NEGATIVE Final    Comment: (NOTE) The Xpert Xpress SARS-CoV-2/FLU/RSV assay is intended as an aid in  the diagnosis of influenza from Nasopharyngeal swab specimens and  should not be used as a sole basis for treatment. Nasal washings and  aspirates are unacceptable for Xpert Xpress SARS-CoV-2/FLU/RSV  testing.  Fact Sheet for Patients: PinkCheek.be  Fact Sheet for Healthcare Providers: GravelBags.it  This test is not yet approved or cleared by the [place] FDA and  has been authorized for detection and/or diagnosis of SARS-CoV-2 by  FDA under an Emergency Use Authorization (EUA). This EUA will remain  in effect (meaning this test can be used) for the duration of the  Covid-19 declaration under Section 564(b)(1) of the Act, 21  U.S.C. section 360bbb-3(b)(1), unless the authorization is  terminated or revoked.    Respiratory Syncytial Virus by PCR NEGATIVE NEGATIVE Final    Comment: (NOTE) Fact Sheet for Patients: PinkCheek.be  Fact Sheet for Healthcare Providers: GravelBags.it  This test is not yet approved or cleared by the [place] FDA and  has been authorized for detection and/or diagnosis of SARS-CoV-2 by  FDA under an Emergency Use Authorization (EUA). This EUA will remain  in effect (meaning this test can be used) for the duration of the  COVID-19 declaration under Section 564(b)(1) of the Act, 21 U.S.C.  section 360bbb-3(b)(1), unless the authorization is terminated or   revoked. Performed at [facility], [place] [address] [place], [place] [address]      Radiological Exams on Admission: DG Chest Port 1 View  Result Date: 02/03/2020 CLINICAL DATA:  Shortness of breath and cough for 1 month EXAM: PORTABLE CHEST 1 VIEW COMPARISON:  None. FINDINGS: Cardiac shadow is at the upper limits of normal in size. Aortic calcifications are seen. The lungs are well aerated bilaterally with mild left basilar atelectasis/early infiltrate. No bony abnormality is noted. IMPRESSION: Left basilar opacity consistent with atelectasis/infiltrate. Electronically Signed   By: Inez Catalina M.D.   On: 02/03/2020 20:06    EKG: Independently reviewed.  Shows normal sinus rhythm with no significant ST changes.  Assessment/Plan Principal Problem:   Acute respiratory failure with hypoxia (HCC) Active Problems:   Spinal stenosis, lumbar   ARF (acute renal failure) ([place])  Hyponatremia   Leucocytosis   Hypokalemia   Elevated brain natriuretic peptide (BNP) level   Sepsis ([place])     #1  Sepsis due to pneumonia: Patient will be admitted initiated on IV antibiotics.  Sputum cultures will be obtained.  She is COVID-19 negative.  Meets sepsis criteria with leukocytosis elevated heart rate and respiratory rate.  Also evidence of pneumonia.  We will admit to telemetry.  Hydrate and monitor.  #2 pneumonia: Suspected community-acquired.  Continue as above.  #3 hyponatremia: Most likely dehydration.  Aggressively hydrate and monitor.  #4 hypokalemia: Continue to replete potassium.  #5 acute on chronic kidney disease stage III: Baseline creatinine has been around 1.6.  Hydrate and monitor renal function.  #6 elevated BNP: We will get an echocardiogram to evaluate.  I doubt this is CHF.   DVT prophylaxis: Lovenox Code Status: Full code Family Communication: No family at bedside Disposition Plan: Home Consults called: None Admission status: Inpatient  Severity of  Illness: The appropriate patient status for this patient is INPATIENT. Inpatient status is judged to be reasonable and necessary in order to provide the required intensity of service to ensure the patient's safety. The patient's presenting symptoms, physical exam findings, and initial radiographic and laboratory data in the context of their chronic comorbidities is felt to place them at high risk for further clinical deterioration. Furthermore, it is not anticipated that the patient will be medically stable for discharge from the hospital within 2 midnights of admission. The following factors support the patient status of inpatient.   " The patient's presenting symptoms include shortness of breath. " The worrisome physical exam findings include coarse breath sound bilaterally. " The initial radiographic and laboratory data are worrisome because of left lower lobe infiltrate. " The chronic co-morbidities include chronic kidney disease.   * I certify that at the point of admission it is my clinical judgment that the patient will require inpatient hospital care spanning beyond 2 midnights from the point of admission due to high intensity of service, high risk for further deterioration and high frequency of surveillance required.Barbette Merino MD [facility] Pager [phone]  If 7PM-7AM, please contact night-coverage [email or web address] Password [email or web address]  02/03/2020, 10:05 PM

## 2020-02-04 ENCOUNTER — Inpatient Hospital Stay (HOSPITAL_COMMUNITY): Payer: PPO

## 2020-02-04 ENCOUNTER — Encounter (HOSPITAL_COMMUNITY): Payer: Self-pay | Admitting: Internal Medicine

## 2020-02-04 DIAGNOSIS — R0602 Shortness of breath: Secondary | ICD-10-CM

## 2020-02-04 DIAGNOSIS — I340 Nonrheumatic mitral (valve) insufficiency: Secondary | ICD-10-CM

## 2020-02-04 DIAGNOSIS — J9601 Acute respiratory failure with hypoxia: Secondary | ICD-10-CM | POA: Diagnosis not present

## 2020-02-04 LAB — COMPREHENSIVE METABOLIC PANEL
ALT: 16 U/L (ref 0–44)
ALT: 18 U/L (ref 0–44)
AST: 17 U/L (ref 15–41)
AST: 20 U/L (ref 15–41)
Albumin: 2.5 g/dL — ABNORMAL LOW (ref 3.5–5.0)
Albumin: 2.8 g/dL — ABNORMAL LOW (ref 3.5–5.0)
Alkaline Phosphatase: 135 U/L — ABNORMAL HIGH (ref 38–126)
Alkaline Phosphatase: 261 U/L — ABNORMAL HIGH (ref 38–126)
Anion gap: 12 (ref 5–15)
Anion gap: 14 (ref 5–15)
BUN: 39 mg/dL — ABNORMAL HIGH (ref 8–23)
BUN: 40 mg/dL — ABNORMAL HIGH (ref 8–23)
CO2: 22 mmol/L (ref 22–32)
CO2: 19 mmol/L — ABNORMAL LOW (ref 22–32)
Calcium: 8.2 mg/dL — ABNORMAL LOW (ref 8.9–10.3)
Calcium: 8.5 mg/dL — ABNORMAL LOW (ref 8.9–10.3)
Chloride: 99 mmol/L (ref 98–111)
Chloride: 100 mmol/L (ref 98–111)
Creatinine, Ser: 1.75 mg/dL — ABNORMAL HIGH (ref 0.44–1.00)
Creatinine, Ser: 1.7 mg/dL — ABNORMAL HIGH (ref 0.44–1.00)
GFR, Estimated: 27 mL/min — ABNORMAL LOW (ref >60)
GFR, Estimated: 30 mL/min — ABNORMAL LOW (ref >60)
Glucose, Bld: 168 mg/dL — ABNORMAL HIGH (ref 70–99)
Glucose, Bld: 106 mg/dL — ABNORMAL HIGH (ref 70–99)
Potassium: 4 mmol/L (ref 3.5–5.1)
Potassium: 3.5 mmol/L (ref 3.5–5.1)
Sodium: 133 mmol/L — ABNORMAL LOW (ref 135–145)
Sodium: 133 mmol/L — ABNORMAL LOW (ref 135–145)
Total Bilirubin: 0.8 mg/dL (ref 0.3–1.2)
Total Bilirubin: 0.9 mg/dL (ref 0.3–1.2)
Total Protein: 6.3 g/dL — ABNORMAL LOW (ref 6.5–8.1)
Total Protein: 6.8 g/dL (ref 6.5–8.1)

## 2020-02-04 LAB — CBC WITH DIFFERENTIAL/PLATELET
Abs Immature Granulocytes: 0.15 10*3/uL — ABNORMAL HIGH (ref 0.00–0.07)
Abs Immature Granulocytes: 0.67 10*3/uL — ABNORMAL HIGH (ref 0.00–0.07)
Basophils Absolute: 0 10*3/uL (ref 0.0–0.1)
Basophils Absolute: 0.1 10*3/uL (ref 0.0–0.1)
Basophils Relative: 0 %
Basophils Relative: 0 %
Eosinophils Absolute: 0 10*3/uL (ref 0.0–0.5)
Eosinophils Absolute: 0 10*3/uL (ref 0.0–0.5)
Eosinophils Relative: 0 %
Eosinophils Relative: 0 %
HCT: 28 % — ABNORMAL LOW (ref 36.0–46.0)
HCT: 33.2 % — ABNORMAL LOW (ref 36.0–46.0)
Hemoglobin: 9 g/dL — ABNORMAL LOW (ref 12.0–15.0)
Hemoglobin: 10.5 g/dL — ABNORMAL LOW (ref 12.0–15.0)
Immature Granulocytes: 1 %
Immature Granulocytes: 6 %
Lymphocytes Relative: 4 %
Lymphocytes Relative: 1 %
Lymphs Abs: 0.5 10*3/uL — ABNORMAL LOW (ref 0.7–4.0)
Lymphs Abs: 0.2 10*3/uL — ABNORMAL LOW (ref 0.7–4.0)
MCH: 28.7 pg (ref 26.0–34.0)
MCH: 28.8 pg (ref 26.0–34.0)
MCHC: 32.1 g/dL (ref 30.0–36.0)
MCHC: 31.6 g/dL (ref 30.0–36.0)
MCV: 89.2 fL (ref 80.0–100.0)
MCV: 91 fL (ref 80.0–100.0)
Monocytes Absolute: 0.2 10*3/uL (ref 0.1–1.0)
Monocytes Absolute: 0.1 10*3/uL (ref 0.1–1.0)
Monocytes Relative: 1 %
Monocytes Relative: 1 %
Neutro Abs: 11.2 10*3/uL — ABNORMAL HIGH (ref 1.7–7.7)
Neutro Abs: 10.4 10*3/uL — ABNORMAL HIGH (ref 1.7–7.7)
Neutrophils Relative %: 94 %
Neutrophils Relative %: 92 %
Platelets: 187 10*3/uL (ref 150–400)
Platelets: 189 10*3/uL (ref 150–400)
RBC: 3.14 MIL/uL — ABNORMAL LOW (ref 3.87–5.11)
RBC: 3.65 MIL/uL — ABNORMAL LOW (ref 3.87–5.11)
RDW: 16.3 % — ABNORMAL HIGH (ref 11.5–15.5)
RDW: 16.6 % — ABNORMAL HIGH (ref 11.5–15.5)
WBC: 11.9 10*3/uL — ABNORMAL HIGH (ref 4.0–10.5)
WBC: 11.4 10*3/uL — ABNORMAL HIGH (ref 4.0–10.5)
nRBC: 0 % (ref 0.0–0.2)
nRBC: 0 % (ref 0.0–0.2)

## 2020-02-04 LAB — BLOOD GAS, ARTERIAL
Acid-base deficit: 1.7 mmol/L (ref 0.0–2.0)
Bicarbonate: 21 mmol/L (ref 20.0–28.0)
Drawn by: 225631
FIO2: 100
O2 Content: 10 L/min
O2 Saturation: 95.1 %
Patient temperature: 103.1
pCO2 arterial: 30.3 mmHg — ABNORMAL LOW (ref 32.0–48.0)
pH, Arterial: 7.456 — ABNORMAL HIGH (ref 7.350–7.450)
pO2, Arterial: 75.3 mmHg — ABNORMAL LOW (ref 83.0–108.0)

## 2020-02-04 LAB — HIV ANTIBODY (ROUTINE TESTING W REFLEX): HIV Screen 4th Generation wRfx: NONREACTIVE

## 2020-02-04 LAB — BRAIN NATRIURETIC PEPTIDE: B Natriuretic Peptide: 557.5 pg/mL — ABNORMAL HIGH (ref 0.0–100.0)

## 2020-02-04 LAB — STREP PNEUMONIAE URINARY ANTIGEN: Strep Pneumo Urinary Antigen: NEGATIVE

## 2020-02-04 LAB — LACTIC ACID, PLASMA
Lactic Acid, Venous: 1.4 mmol/L (ref 0.5–1.9)
Lactic Acid, Venous: 1.3 mmol/L (ref 0.5–1.9)
Lactic Acid, Venous: 2.2 mmol/L (ref 0.5–1.9)

## 2020-02-04 LAB — TROPONIN I (HIGH SENSITIVITY): Troponin I (High Sensitivity): 61 ng/L — ABNORMAL HIGH (ref <18)

## 2020-02-04 MED ORDER — ENOXAPARIN SODIUM 40 MG/0.4ML ~~LOC~~ SOLN
40.0000 mg | SUBCUTANEOUS | Status: DC
  Administered 2020-02-04: 40 mg via SUBCUTANEOUS
  Filled 2020-02-04 (×2): qty 0.4

## 2020-02-04 MED ORDER — SODIUM CHLORIDE 0.9 % IV SOLN
500.0000 mg | INTRAVENOUS | Status: DC
  Administered 2020-02-04 – 2020-02-05 (×2): 500 mg via INTRAVENOUS
  Filled 2020-02-04 (×2): qty 500

## 2020-02-04 MED ORDER — ESCITALOPRAM OXALATE 10 MG PO TABS
5.0000 mg | ORAL_TABLET | Freq: Every day | ORAL | Status: DC
  Administered 2020-02-04 – 2020-02-16 (×13): 5 mg via ORAL
  Filled 2020-02-04 (×13): qty 1

## 2020-02-04 MED ORDER — ALPRAZOLAM 0.25 MG PO TABS
0.2500 mg | ORAL_TABLET | Freq: Once | ORAL | Status: AC
  Administered 2020-02-04: 0.25 mg via ORAL
  Filled 2020-02-04: qty 1

## 2020-02-04 MED ORDER — SODIUM CHLORIDE 0.9 % IV SOLN: INTRAVENOUS | Status: DC

## 2020-02-04 MED ORDER — OXYBUTYNIN CHLORIDE 5 MG PO TABS
5.0000 mg | ORAL_TABLET | Freq: Two times a day (BID) | ORAL | Status: DC
  Administered 2020-02-04 – 2020-02-18 (×28): 5 mg via ORAL
  Filled 2020-02-04 (×28): qty 1

## 2020-02-04 MED ORDER — LAMOTRIGINE 25 MG PO TABS
150.0000 mg | ORAL_TABLET | Freq: Two times a day (BID) | ORAL | Status: DC
  Administered 2020-02-04 – 2020-02-18 (×28): 150 mg via ORAL
  Filled 2020-02-04 (×28): qty 2

## 2020-02-04 MED ORDER — ACETAMINOPHEN 500 MG PO TABS
500.0000 mg | ORAL_TABLET | Freq: Four times a day (QID) | ORAL | Status: DC | PRN
  Administered 2020-02-04 – 2020-02-06 (×2): 500 mg via ORAL
  Filled 2020-02-04 (×3): qty 1

## 2020-02-04 MED ORDER — IPRATROPIUM-ALBUTEROL 0.5-2.5 (3) MG/3ML IN SOLN
3.0000 mL | RESPIRATORY_TRACT | Status: DC
  Administered 2020-02-04 – 2020-02-05 (×3): 3 mL via RESPIRATORY_TRACT
  Filled 2020-02-04 (×3): qty 3

## 2020-02-04 MED ORDER — SODIUM CHLORIDE 0.9 % IV SOLN
2.0000 g | INTRAVENOUS | Status: AC
  Administered 2020-02-04 – 2020-02-07 (×4): 2 g via INTRAVENOUS
  Filled 2020-02-04 (×3): qty 2
  Filled 2020-02-04: qty 20

## 2020-02-04 MED ORDER — ACETAMINOPHEN 80 MG PO CHEW
500.0000 mg | CHEWABLE_TABLET | Freq: Four times a day (QID) | ORAL | Status: DC | PRN
  Filled 2020-02-04: qty 7

## 2020-02-04 NOTE — Progress Notes (Signed)
  Echocardiogram 2D Echocardiogram has been performed.  Jannett Celestine 02/04/2020, 2:55 PM

## 2020-02-04 NOTE — Progress Notes (Signed)
CRITICAL VALUE ALERT  Critical Value:  Lactic acid =2.2  Date & Time Notied:  [id number] @2230   Provider Notified: Dr Hal Hope @ 2138  Orders Received/Actions taken: orders to repeat LA in 2 hrs

## 2020-02-04 NOTE — ED Notes (Signed)
ED TO INPATIENT HANDOFF REPORT  Name/Age/Gender Kathleen Schneider 79 y.o. female  Code Status    Code Status Orders  (From admission, onward)         Start     Ordered   02/04/20 0625  Full code  Continuous        02/04/20 0624        Code Status History    Date Active Date Inactive Code Status Order ID Comments User Context   12/09/2019 0817 12/11/2019 1837 Full Code [id number]  Donne Hazel, MD ED   Advance Care Planning Activity      Home/SNF/Other Home  Chief Complaint Acute respiratory failure with hypoxia ([place]) [J96.01]  Level of Care/Admitting Diagnosis ED Disposition    ED Disposition Condition [place] [facility]: [facility] [[id number]]  Level of Care: Telemetry [5]  Admit to tele based on following criteria: Complex arrhythmia (Bradycardia/Tachycardia)  May admit patient to [facility] or [facility] if equivalent level of care is available:: Yes  Covid Evaluation: Confirmed COVID Negative  Diagnosis: Acute respiratory failure with hypoxia [facility]) [[id number]]  Admitting Physician: Elwyn Reach [[id number]]  Attending Physician: Elwyn Reach [[id number]]  Estimated length of stay: past midnight tomorrow  Certification:: I certify this patient will need inpatient services for at least 2 midnights       Medical History Past Medical History:  Diagnosis Date  . HTN (hypertension)   . Vision abnormalities     Allergies No Known Allergies  IV Location/Drains/Wounds Patient Lines/Drains/Airways Status    Active Line/Drains/Airways    Name Placement date Placement time Site Days   Peripheral IV 02/03/20 Left Antecubital 02/03/20  1826  Antecubital  1   External Urinary Catheter 12/09/19  0555  --  57   Wound / Incision (Open or Dehisced) Knee Anterior;Right --  --  Knee     Wound / Incision (Open or Dehisced) 12/09/19 Knee Anterior;Left open abrasion 12/09/19  1300  Knee  57   Wound / Incision (Open or Dehisced) 12/09/19  Laceration Elbow Left;Posterior open abrasion 12/09/19  1300  Elbow  57   Wound / Incision (Open or Dehisced) 12/09/19 Elbow Posterior;Right open abrasion 12/09/19  1300  Elbow  57          Labs/Imaging Results for orders placed or performed during the hospital encounter of 02/03/20 (from the past 48 hour(s))  Brain natriuretic peptide     Status: Abnormal   Collection Time: 02/03/20  6:12 PM  Result Value Ref Range   B Natriuretic Peptide 382.3 (H) 0.0 - 100.0 pg/mL    Comment: Performed at [facility], [place] [address] [place], [place] [address]  Urinalysis, Routine w reflex microscopic     Status: Abnormal   Collection Time: 02/03/20  6:12 PM  Result Value Ref Range   Color, Urine AMBER (A) YELLOW    Comment: BIOCHEMICALS MAY BE AFFECTED BY COLOR   APPearance HAZY (A) CLEAR   Specific Gravity, Urine 1.020 1.005 - 1.030   pH 5.0 5.0 - 8.0   Glucose, UA NEGATIVE NEGATIVE mg/dL   Hgb urine dipstick MODERATE (A) NEGATIVE   Bilirubin Urine NEGATIVE NEGATIVE   Ketones, ur 5 (A) NEGATIVE mg/dL   Protein, ur 100 (A) NEGATIVE mg/dL   Nitrite NEGATIVE NEGATIVE   Leukocytes,Ua TRACE (A) NEGATIVE   RBC / HPF 0-5 0 - 5 RBC/hpf   WBC, UA 21-50 0 - 5 WBC/hpf  Bacteria, UA MANY (A) NONE SEEN   Squamous Epithelial / LPF 0-5 0 - 5   Mucus PRESENT    Hyaline Casts, UA PRESENT    Amorphous Crystal PRESENT     Comment: Performed at [facility], [place] [address], [place], [place] [address]  Comprehensive metabolic panel     Status: Abnormal   Collection Time: 02/03/20  6:20 PM  Result Value Ref Range   Sodium 132 (L) 135 - 145 mmol/L   Potassium 3.2 (L) 3.5 - 5.1 mmol/L   Chloride 96 (L) 98 - 111 mmol/L   CO2 22 22 - 32 mmol/L   Glucose, Bld 139 (H) 70 - 99 mg/dL    Comment: Glucose reference range applies only to samples taken after fasting for at least 8 hours.   BUN 37 (H) 8 - 23 mg/dL   Creatinine, Ser 2.05 (H) 0.44 - 1.00 mg/dL   Calcium 8.5 (L)  8.9 - 10.3 mg/dL   Total Protein 7.7 6.5 - 8.1 g/dL   Albumin 3.1 (L) 3.5 - 5.0 g/dL   AST 23 15 - 41 U/L   ALT 20 0 - 44 U/L   Alkaline Phosphatase 187 (H) 38 - 126 U/L   Total Bilirubin 1.2 0.3 - 1.2 mg/dL   GFR, Estimated 22 (L) >60 mL/min   Anion gap 14 5 - 15    Comment: Performed at [facility], [place] [address], [place], [place] [address]  CBC WITH DIFFERENTIAL     Status: Abnormal   Collection Time: 02/03/20  6:20 PM  Result Value Ref Range   WBC 18.2 (H) 4.0 - 10.5 K/uL   RBC 3.96 3.87 - 5.11 MIL/uL   Hemoglobin 11.3 (L) 12.0 - 15.0 g/dL   HCT 35.8 (L) 36 - 46 %   MCV 90.4 80.0 - 100.0 fL   MCH 28.5 26.0 - 34.0 pg   MCHC 31.6 30.0 - 36.0 g/dL   RDW 16.0 (H) 11.5 - 15.5 %   Platelets 238 150 - 400 K/uL   nRBC 0.0 0.0 - 0.2 %   Neutrophils Relative % 91 %   Neutro Abs 16.4 (H) 1.7 - 7.7 K/uL   Lymphocytes Relative 7 %   Lymphs Abs 1.2 0.7 - 4.0 K/uL   Monocytes Relative 1 %   Monocytes Absolute 0.2 0.1 - 1.0 K/uL   Eosinophils Relative 0 %   Eosinophils Absolute 0.1 0.0 - 0.5 K/uL   Basophils Relative 0 %   Basophils Absolute 0.0 0.0 - 0.1 K/uL   WBC Morphology VACUOLATED NEUTROPHILS    Immature Granulocytes 1 %   Abs Immature Granulocytes 0.26 (H) 0.00 - 0.07 K/uL   Burr Cells PRESENT     Comment: Performed at [facility] [place] [address] [place], [place] [address]  CK     Status: None   Collection Time: 02/03/20  6:20 PM  Result Value Ref Range   Total CK 60 38.0 - 234.0 U/L    Comment: Performed at [facility], [place] [address], [place], [place] [address]  Resp Panel by RT PCR (RSV, Flu A&B, Covid) - Nasopharyngeal Swab     Status: None   Collection Time: 02/03/20  6:25 PM   Specimen: Nasopharyngeal Swab  Result Value Ref Range   SARS Coronavirus 2 by RT PCR NEGATIVE NEGATIVE    Comment: (NOTE) SARS-CoV-2 target nucleic acids are NOT DETECTED.  The SARS-CoV-2 RNA is generally detectable in upper  respiratoy specimens during the acute phase of infection. The lowest concentration of SARS-CoV-2 viral copies this assay can detect is 131 copies/mL. A negative result does not preclude SARS-Cov-2 infection and should not be used as the sole basis for treatment or other patient management decisions. A negative result may occur with  improper specimen collection/handling, submission of specimen other than nasopharyngeal swab, presence of viral mutation(s) within the areas targeted by this assay, and inadequate number of viral copies (<131 copies/mL). A negative result must be combined with clinical observations, patient history, and epidemiological information. The expected result is Negative.  Fact Sheet for Patients:  PinkCheek.be  Fact Sheet for Healthcare Providers:  GravelBags.it  This test is no t yet approved or cleared by the [place] FDA and  has been authorized for detection and/or diagnosis of SARS-CoV-2 by FDA under an Emergency Use Authorization (EUA). This EUA will remain  in effect (meaning this test can be used) for the duration of the COVID-19 declaration under Section 564(b)(1) of the Act, 21 U.S.C. section 360bbb-3(b)(1), unless the authorization is terminated or revoked sooner.     Influenza A by PCR NEGATIVE NEGATIVE   Influenza B by PCR NEGATIVE NEGATIVE    Comment: (NOTE) The Xpert Xpress SARS-CoV-2/FLU/RSV assay is intended as an aid in  the diagnosis of influenza from Nasopharyngeal swab specimens and  should not be used as a sole basis for treatment. Nasal washings and  aspirates are unacceptable for Xpert Xpress SARS-CoV-2/FLU/RSV  testing.  Fact Sheet for Patients: PinkCheek.be  Fact Sheet for Healthcare Providers: GravelBags.it  This test is not yet approved or cleared by the [place] FDA and  has been authorized for  detection and/or diagnosis of SARS-CoV-2 by  FDA under an Emergency Use Authorization (EUA). This EUA will remain  in effect (meaning this test can be used) for the duration of the  Covid-19 declaration under Section 564(b)(1) of the Act, 21  U.S.C. section 360bbb-3(b)(1), unless the authorization is  terminated or revoked.    Respiratory Syncytial Virus by PCR NEGATIVE NEGATIVE    Comment: (NOTE) Fact Sheet for Patients: PinkCheek.be  Fact Sheet for Healthcare Providers: GravelBags.it  This test is not yet approved or cleared by the [place] FDA and  has been authorized for detection and/or diagnosis of SARS-CoV-2 by  FDA under an Emergency Use Authorization (EUA). This EUA will remain  in effect (meaning this test can be used) for the duration of the  COVID-19 declaration under Section 564(b)(1) of the Act, 21 U.S.C.  section 360bbb-3(b)(1), unless the authorization is terminated or  revoked. Performed at [facility], [place] [address] [place], [place] [address]   HIV Antibody (routine testing w rflx)     Status: None   Collection Time: 02/04/20  6:25 AM  Result Value Ref Range   HIV Screen 4th Generation wRfx Non Reactive Non Reactive    Comment: Performed at [facility], [place] [address], [place], [place] [address]  Strep pneumoniae urinary antigen     Status: None   Collection Time: 02/04/20  6:25 AM  Result Value Ref Range   Strep Pneumo Urinary Antigen NEGATIVE NEGATIVE    Comment:        Infection due to S. pneumoniae cannot be absolutely ruled out since the antigen present may be below the detection limit of the test. Performed at [facility], [place] [address] [place], [place] [address]   Comprehensive metabolic panel  Status: Abnormal   Collection Time: 02/04/20  6:25 AM  Result Value Ref Range   Sodium 133 (L) 135 - 145 mmol/L   Potassium 4.0 3.5 - 5.1 mmol/L     Comment: DELTA CHECK NOTED NO VISIBLE HEMOLYSIS    Chloride 99 98 - 111 mmol/L   CO2 22 22 - 32 mmol/L   Glucose, Bld 168 (H) 70 - 99 mg/dL    Comment: Glucose reference range applies only to samples taken after fasting for at least 8 hours.   BUN 39 (H) 8 - 23 mg/dL   Creatinine, Ser 1.75 (H) 0.44 - 1.00 mg/dL   Calcium 8.2 (L) 8.9 - 10.3 mg/dL   Total Protein 6.3 (L) 6.5 - 8.1 g/dL   Albumin 2.5 (L) 3.5 - 5.0 g/dL   AST 17 15 - 41 U/L   ALT 16 0 - 44 U/L   Alkaline Phosphatase 135 (H) 38 - 126 U/L   Total Bilirubin 0.8 0.3 - 1.2 mg/dL   GFR, Estimated 27 (L) >60 mL/min   Anion gap 12 5 - 15    Comment: Performed at [facility], [place] [address], [place], [place] [address]  CBC WITH DIFFERENTIAL     Status: Abnormal   Collection Time: 02/04/20  6:25 AM  Result Value Ref Range   WBC 11.9 (H) 4.0 - 10.5 K/uL   RBC 3.14 (L) 3.87 - 5.11 MIL/uL   Hemoglobin 9.0 (L) 12.0 - 15.0 g/dL   HCT 28.0 (L) 36 - 46 %   MCV 89.2 80.0 - 100.0 fL   MCH 28.7 26.0 - 34.0 pg   MCHC 32.1 30.0 - 36.0 g/dL   RDW 16.3 (H) 11.5 - 15.5 %   Platelets 187 150 - 400 K/uL   nRBC 0.0 0.0 - 0.2 %   Neutrophils Relative % 94 %   Neutro Abs 11.2 (H) 1.7 - 7.7 K/uL   Lymphocytes Relative 4 %   Lymphs Abs 0.5 (L) 0.7 - 4.0 K/uL   Monocytes Relative 1 %   Monocytes Absolute 0.2 0.1 - 1.0 K/uL   Eosinophils Relative 0 %   Eosinophils Absolute 0.0 0.0 - 0.5 K/uL   Basophils Relative 0 %   Basophils Absolute 0.0 0.0 - 0.1 K/uL   Immature Granulocytes 1 %   Abs Immature Granulocytes 0.15 (H) 0.00 - 0.07 K/uL   Polychromasia PRESENT     Comment: Performed at [facility], [place] [address] [place], [place] [address]   DG Chest Port 1 View  Result Date: 02/03/2020 CLINICAL DATA:  Shortness of breath and cough for 1 month EXAM: PORTABLE CHEST 1 VIEW COMPARISON:  None. FINDINGS: Cardiac shadow is at the upper limits of normal in size. Aortic calcifications are seen. The lungs  are well aerated bilaterally with mild left basilar atelectasis/early infiltrate. No bony abnormality is noted. IMPRESSION: Left basilar opacity consistent with atelectasis/infiltrate. Electronically Signed   By: Inez Catalina M.D.   On: 02/03/2020 20:06    Pending Labs Unresulted Labs (From admission, onward)          Start     Ordered   02/10/20 0500  Creatinine, serum  (enoxaparin (LOVENOX)    CrCl >/= 30 ml/min)  Weekly,   R     Comments: while on enoxaparin therapy    02/04/20 0624   02/04/20 0625  Culture, sputum-assessment  Once,   R        02/04/20 0624   02/04/20 8182  CBC  (enoxaparin (LOVENOX)    CrCl >/= 30 ml/min)  Once,   STAT       Comments: Baseline for enoxaparin therapy IF NOT ALREADY DRAWN.  Notify MD if PLT < 100 K.    02/04/20 3570   02/04/20 0624  Culture, blood (routine x 2) Call MD if unable to obtain prior to antibiotics being given  BLOOD CULTURE X 2,   R (with STAT occurrences)     Comments: If blood cultures drawn in Emergency Department - Do not draw and cancel order    02/04/20 0624          Vitals/Pain Today's Vitals   02/04/20 1130 02/04/20 1200 02/04/20 1252 02/04/20 1300  BP: 132/71 135/82 124/70 124/83  Pulse: 62 73 65 76  Resp: (!) 25 18 (!) 23 20  Temp:      TempSrc:      SpO2: 97% 96% 97% 94%    Isolation Precautions No active isolations  Medications Medications  0.9 %  sodium chloride infusion ( Intravenous New Bag/Given 02/04/20 0645)  enoxaparin (LOVENOX) injection 40 mg (40 mg Subcutaneous Given 02/04/20 0645)  cefTRIAXone (ROCEPHIN) 2 g in sodium chloride 0.9 % 100 mL IVPB (2 g Intravenous Not Given 02/04/20 0707)  azithromycin (ZITHROMAX) 500 mg in sodium chloride 0.9 % 250 mL IVPB (500 mg Intravenous Not Given 02/04/20 0708)  acetaminophen (TYLENOL) chewable tablet 520 mg (has no administration in time range)  sodium chloride 0.9 % bolus 1,000 mL (0 mLs Intravenous Stopped 02/03/20 2305)  azithromycin (ZITHROMAX) 500 mg in  sodium chloride 0.9 % 250 mL IVPB (0 mg Intravenous Stopped 02/04/20 0056)  cefTRIAXone (ROCEPHIN) 1 g in sodium chloride 0.9 % 100 mL IVPB (0 g Intravenous Stopped 02/03/20 2208)    Mobility walks

## 2020-02-04 NOTE — Progress Notes (Signed)
During shift report at 1915- pt was found to be shivering, tachypnea RR=33. Then all of a sudden pt burst into tears crying - she is not sure whats wrong. Pt on Room Air -initially placed on 6lnc but because of rapid mouth breathing  placed on 10L NRB. Dr Hal Hope was sent an [facility] text page & he called back. Pt discussed and new orders obtained.

## 2020-02-04 NOTE — Progress Notes (Signed)
Called ER in attempt to receive report from nurse, will call back.

## 2020-02-04 NOTE — Progress Notes (Signed)
PROGRESS NOTE    Kathleen Schneider  QPR:[id number] DOB: 1940/08/12 DOA: 02/03/2020 PCP: Donald Prose, MD   Brief Narrative:79 y.o. female with medical history significant of hypertension, recurrent UTI, chronic kidney disease stage III, excessive daytime sleepiness, gait abnormalities, visual changes, who came in from home with shortness of breath and weakness.  Patient noticed progressive shortness of breath over the last 3 to 4 days.  Associated with some cough.  She felt very weak.  No fever or chills.  She has been weak that she has been mostly in bed.  She came to the ER where she was evaluated.  Patient was noted to be mildly hypoxic.  She also requires about 2 L of oxygen now.  She is COVID-19 negative but has evidence of pneumonia versus CHF.  Her BNP is elevated but also has leukocytosis and infiltrates that could be technically speaking and pneumonia.  She is got elevated creatinine which is slightly above her baseline.  She is therefore being admitted to the hospital for acute respiratory failure probably pneumonia but could be CHF as well..  ED Course: Temperature 98.1 blood pressure 130/59 pulse 108 respiratory rate of 24 oxygen sats 93% on 2 L.  White count is 18.2, hemoglobin 11.3, platelets 238 sodium 132 potassium 3.2 chloride 96 CO2 22 BUN 37 creatinine 2.05 and calcium 8.5.  Glucose 139.  COVID-19 screen is negative.  Urinalysis showed many bacteria with WBC 21-50 but negative leukocytes and nitrite.  Probably asymptomatic bacteriuria.  Chest x-ray showed left basilar opacity consistent with atelectasis versus infiltrate.  BNP is 382.  Patient being admitted with pneumonia as well as suspected cardiac cause.   Assessment & Plan:   Principal Problem:   Acute respiratory failure with hypoxia (HCC) Active Problems:   Spinal stenosis, lumbar   ARF (acute renal failure) (HCC)   Hyponatremia   Leucocytosis   Hypokalemia   Elevated brain natriuretic peptide (BNP) level   Sepsis  (HCC)  #1 community-acquired pneumonia-she met SIRS criteria on admission with tachypnea and tachycardia.  I do not have a lactic acid on her.  Patient was tachypneic at 24 and tachycardic at 108 on admission.  Saturation was 94% on 3 L on admission.  She has leukocytosis with soft blood pressure. Chest x-ray left basilar opacity consistent with atelectasis or infiltrates, no evidence of CHF. On Rocephin and azithromycin. Check pulse ox on room air check lactic acid  #2 mild hyponatremia likely secondary to #1  #3 AKI on CKD stage IIIb follow-up labs in a.m. after slow hydration.  #4 elevated BNP?  This is related to AKI or obesity.  Check echo.    Estimated body mass index is 36.25 kg/m as calculated from the following:   Height as of this encounter: 5' (1.524 m).   Weight as of this encounter: 84.2 kg.  DVT prophylaxis: Lovenox  code Status: Full code  family Communication: None at bedside  disposition Plan:  Status is: Inpatient  Dispo: The patient is from: Home              Anticipated d/c is to: Home              Anticipated d/c date is: 2 days              Patient currently is not medically stable to d/c.    Consultants:   None  Procedures: None Antimicrobials: Rocephin and azithromycin  Subjective: Patient resting in bed seen in the ER feels better  than yesterday however continues with shortness of breath on oxygen  Objective: Vitals:   02/04/20 1130 02/04/20 1200 02/04/20 1252 02/04/20 1300  BP: 132/71 135/82 124/70 124/83  Pulse: 62 73 65 76  Resp: (!) 25 18 (!) 23 20  Temp:    98.2 F (36.8 C)  TempSrc:      SpO2: 97% 96% 97% 94%  Weight:    84.2 kg  Height:    5' (1.524 m)    Intake/Output Summary (Last 24 hours) at 02/04/2020 1429 Last data filed at 02/03/2020 2305 Gross per 24 hour  Intake 3030.4 ml  Output --  Net 3030.4 ml   Filed Weights   02/04/20 1300  Weight: 84.2 kg    Examination:  General exam: Appears calm and comfortable   Respiratory system: Coarse breath sounds bases to auscultation. Respiratory effort normal. Cardiovascular system: S1 & S2 heard, RRR. No JVD, murmurs, rubs, gallops or clicks. No pedal edema. Gastrointestinal system: Abdomen is nondistended, soft and nontender. No organomegaly or masses felt. Normal bowel sounds heard. Central nervous system: Alert and oriented. No focal neurological deficits. Extremities: Symmetric 5 x 5 power. Skin: No rashes, lesions or ulcers Psychiatry: Judgement and insight appear normal. Mood & affect appropriate.     Data Reviewed: I have personally reviewed following labs and imaging studies  CBC: Recent Labs  Lab 02/03/20 1820 02/04/20 0625  WBC 18.2* 11.9*  NEUTROABS 16.4* 11.2*  HGB 11.3* 9.0*  HCT 35.8* 28.0*  MCV 90.4 89.2  PLT 238 638   Basic Metabolic Panel: Recent Labs  Lab 02/03/20 1820 02/04/20 0625  NA 132* 133*  K 3.2* 4.0  CL 96* 99  CO2 22 22  GLUCOSE 139* 168*  BUN 37* 39*  CREATININE 2.05* 1.75*  CALCIUM 8.5* 8.2*   GFR: Estimated Creatinine Clearance: 24.7 mL/min (A) (by C-G formula based on SCr of 1.75 mg/dL (H)). Liver Function Tests: Recent Labs  Lab 02/03/20 1820 02/04/20 0625  AST 23 17  ALT 20 16  ALKPHOS 187* 135*  BILITOT 1.2 0.8  PROT 7.7 6.3*  ALBUMIN 3.1* 2.5*   No results for input(s): LIPASE, AMYLASE in the last 168 hours. No results for input(s): AMMONIA in the last 168 hours. Coagulation Profile: No results for input(s): INR, PROTIME in the last 168 hours. Cardiac Enzymes: Recent Labs  Lab 02/03/20 1820  CKTOTAL 60   BNP (last 3 results) No results for input(s): PROBNP in the last 8760 hours. HbA1C: No results for input(s): HGBA1C in the last 72 hours. CBG: No results for input(s): GLUCAP in the last 168 hours. Lipid Profile: No results for input(s): CHOL, HDL, LDLCALC, TRIG, CHOLHDL, LDLDIRECT in the last 72 hours. Thyroid Function Tests: No results for input(s): TSH, T4TOTAL, FREET4,  T3FREE, THYROIDAB in the last 72 hours. Anemia Panel: No results for input(s): VITAMINB12, FOLATE, FERRITIN, TIBC, IRON, RETICCTPCT in the last 72 hours. Sepsis Labs: No results for input(s): PROCALCITON, LATICACIDVEN in the last 168 hours.  Recent Results (from the past 240 hour(s))  Resp Panel by RT PCR (RSV, Flu A&B, Covid) - Nasopharyngeal Swab     Status: None   Collection Time: 02/03/20  6:25 PM   Specimen: Nasopharyngeal Swab  Result Value Ref Range Status   SARS Coronavirus 2 by RT PCR NEGATIVE NEGATIVE Final    Comment: (NOTE) SARS-CoV-2 target nucleic acids are NOT DETECTED.  The SARS-CoV-2 RNA is generally detectable in upper respiratoy specimens during the acute phase of infection. The lowest concentration  of SARS-CoV-2 viral copies this assay can detect is 131 copies/mL. A negative result does not preclude SARS-Cov-2 infection and should not be used as the sole basis for treatment or other patient management decisions. A negative result may occur with  improper specimen collection/handling, submission of specimen other than nasopharyngeal swab, presence of viral mutation(s) within the areas targeted by this assay, and inadequate number of viral copies (<131 copies/mL). A negative result must be combined with clinical observations, patient history, and epidemiological information. The expected result is Negative.  Fact Sheet for Patients:  PinkCheek.be  Fact Sheet for Healthcare Providers:  GravelBags.it  This test is no t yet approved or cleared by the [place] FDA and  has been authorized for detection and/or diagnosis of SARS-CoV-2 by FDA under an Emergency Use Authorization (EUA). This EUA will remain  in effect (meaning this test can be used) for the duration of the COVID-19 declaration under Section 564(b)(1) of the Act, 21 U.S.C. section 360bbb-3(b)(1), unless the authorization is terminated  or revoked sooner.     Influenza A by PCR NEGATIVE NEGATIVE Final   Influenza B by PCR NEGATIVE NEGATIVE Final    Comment: (NOTE) The Xpert Xpress SARS-CoV-2/FLU/RSV assay is intended as an aid in  the diagnosis of influenza from Nasopharyngeal swab specimens and  should not be used as a sole basis for treatment. Nasal washings and  aspirates are unacceptable for Xpert Xpress SARS-CoV-2/FLU/RSV  testing.  Fact Sheet for Patients: PinkCheek.be  Fact Sheet for Healthcare Providers: GravelBags.it  This test is not yet approved or cleared by the [place] FDA and  has been authorized for detection and/or diagnosis of SARS-CoV-2 by  FDA under an Emergency Use Authorization (EUA). This EUA will remain  in effect (meaning this test can be used) for the duration of the  Covid-19 declaration under Section 564(b)(1) of the Act, 21  U.S.C. section 360bbb-3(b)(1), unless the authorization is  terminated or revoked.    Respiratory Syncytial Virus by PCR NEGATIVE NEGATIVE Final    Comment: (NOTE) Fact Sheet for Patients: PinkCheek.be  Fact Sheet for Healthcare Providers: GravelBags.it  This test is not yet approved or cleared by the [place] FDA and  has been authorized for detection and/or diagnosis of SARS-CoV-2 by  FDA under an Emergency Use Authorization (EUA). This EUA will remain  in effect (meaning this test can be used) for the duration of the  COVID-19 declaration under Section 564(b)(1) of the Act, 21 U.S.C.  section 360bbb-3(b)(1), unless the authorization is terminated or  revoked. Performed at [facility], [place] [address] [place], [place] [address]   Culture, blood (routine x 2) Call MD if unable to obtain prior to antibiotics being given     Status: None (Preliminary result)   Collection Time: 02/04/20  6:45 AM   Specimen: BLOOD   Result Value Ref Range Status   Specimen Description   Final    BLOOD LEFT ANTECUBITAL Performed at [facility] [address], [place], [place] [address]    Special Requests   Final    BOTTLES DRAWN AEROBIC AND ANAEROBIC Blood Culture adequate volume Performed at [facility] [address], [place], [place] [address]    Culture   Final    NO GROWTH < 12 HOURS Performed at [facility] [address], [place], [place] [address]    Report Status PENDING  Incomplete  Culture, blood (routine x 2) Call MD if unable  to obtain prior to antibiotics being given     Status: None (Preliminary result)   Collection Time: 02/04/20  7:10 AM   Specimen: BLOOD RIGHT HAND  Result Value Ref Range Status   Specimen Description   Final    BLOOD RIGHT HAND Performed at [facility] [address] [place], [place] [address]    Special Requests   Final    BOTTLES DRAWN AEROBIC AND ANAEROBIC Blood Culture adequate volume Performed at [facility] [address] [place], [place] [address]    Culture   Final    NO GROWTH < 12 HOURS Performed at [facility] [address], [place],  [address]    Report Status PENDING  Incomplete         Radiology Studies: DG Chest Port 1 View  Result Date: 02/03/2020 CLINICAL DATA:  Shortness of breath and cough for 1 month EXAM: PORTABLE CHEST 1 VIEW COMPARISON:  None. FINDINGS: Cardiac shadow is at the upper limits of normal in size. Aortic calcifications are seen. The lungs are well aerated bilaterally with mild left basilar atelectasis/early infiltrate. No bony abnormality is noted. IMPRESSION: Left basilar opacity consistent with atelectasis/infiltrate. Electronically Signed   By: Inez Catalina M.D.   On: 02/03/2020 20:06        Scheduled Meds: . enoxaparin (LOVENOX) injection  40 mg Subcutaneous Q24H   Continuous Infusions: . sodium chloride 100  mL/hr at 02/04/20 0645  . azithromycin    . cefTRIAXone (ROCEPHIN)  IV       LOS: 1 day   Georgette Shell, MD 02/04/2020, 2:29 PM

## 2020-02-05 DIAGNOSIS — J9601 Acute respiratory failure with hypoxia: Secondary | ICD-10-CM | POA: Diagnosis not present

## 2020-02-05 LAB — COMPREHENSIVE METABOLIC PANEL
ALT: 16 U/L (ref 0–44)
AST: 17 U/L (ref 15–41)
Albumin: 2.2 g/dL — ABNORMAL LOW (ref 3.5–5.0)
Alkaline Phosphatase: 134 U/L — ABNORMAL HIGH (ref 38–126)
Anion gap: 9 (ref 5–15)
BUN: 41 mg/dL — ABNORMAL HIGH (ref 8–23)
CO2: 22 mmol/L (ref 22–32)
Calcium: 8 mg/dL — ABNORMAL LOW (ref 8.9–10.3)
Chloride: 101 mmol/L (ref 98–111)
Creatinine, Ser: 1.89 mg/dL — ABNORMAL HIGH (ref 0.44–1.00)
GFR, Estimated: 27 mL/min — ABNORMAL LOW (ref >60)
Glucose, Bld: 190 mg/dL — ABNORMAL HIGH (ref 70–99)
Potassium: 3.5 mmol/L (ref 3.5–5.1)
Sodium: 132 mmol/L — ABNORMAL LOW (ref 135–145)
Total Bilirubin: 0.6 mg/dL (ref 0.3–1.2)
Total Protein: 5.7 g/dL — ABNORMAL LOW (ref 6.5–8.1)

## 2020-02-05 LAB — MRSA PCR SCREENING: MRSA by PCR: NEGATIVE

## 2020-02-05 LAB — ECHOCARDIOGRAM COMPLETE
Area-P 1/2: 3.27 cm2
Height: 60 in
S' Lateral: 2.88 cm
Weight: 2970.04 oz

## 2020-02-05 LAB — CBC
HCT: 26.2 % — ABNORMAL LOW (ref 36.0–46.0)
Hemoglobin: 8.4 g/dL — ABNORMAL LOW (ref 12.0–15.0)
MCH: 28.9 pg (ref 26.0–34.0)
MCHC: 32.1 g/dL (ref 30.0–36.0)
MCV: 90 fL (ref 80.0–100.0)
Platelets: 167 10*3/uL (ref 150–400)
RBC: 2.91 MIL/uL — ABNORMAL LOW (ref 3.87–5.11)
RDW: 16.6 % — ABNORMAL HIGH (ref 11.5–15.5)
WBC: 27.6 10*3/uL — ABNORMAL HIGH (ref 4.0–10.5)
nRBC: 0 % (ref 0.0–0.2)

## 2020-02-05 LAB — LACTIC ACID, PLASMA: Lactic Acid, Venous: 1.1 mmol/L (ref 0.5–1.9)

## 2020-02-05 MED ORDER — IPRATROPIUM-ALBUTEROL 0.5-2.5 (3) MG/3ML IN SOLN
3.0000 mL | Freq: Two times a day (BID) | RESPIRATORY_TRACT | Status: DC
  Administered 2020-02-05 – 2020-02-06 (×2): 3 mL via RESPIRATORY_TRACT
  Filled 2020-02-05 (×2): qty 3

## 2020-02-05 MED ORDER — LIDOCAINE 5 % EX PTCH
2.0000 | MEDICATED_PATCH | CUTANEOUS | Status: DC
  Administered 2020-02-06 – 2020-02-16 (×11): 2 via TRANSDERMAL
  Filled 2020-02-05 (×12): qty 2

## 2020-02-05 MED ORDER — LIDOCAINE 5 % EX PTCH
1.0000 | MEDICATED_PATCH | CUTANEOUS | Status: DC
  Administered 2020-02-05: 1 via TRANSDERMAL
  Filled 2020-02-05: qty 1

## 2020-02-05 MED ORDER — ENOXAPARIN SODIUM 30 MG/0.3ML ~~LOC~~ SOLN
30.0000 mg | SUBCUTANEOUS | Status: DC
  Administered 2020-02-05 – 2020-02-17 (×13): 30 mg via SUBCUTANEOUS
  Filled 2020-02-05 (×13): qty 0.3

## 2020-02-05 NOTE — Progress Notes (Signed)
   02/04/20 2012  Assess: MEWS Score  Temp (!) 103.1 F (39.5 C)  Dr Hal Hope saw pt , orders noted . Pt medicated with Tylenol for fever

## 2020-02-05 NOTE — Progress Notes (Signed)
PROGRESS NOTE    Kathleen Schneider  VOJ:[id number] DOB: 1940/05/14 DOA: 02/03/2020 PCP: Donald Prose, MD   Brief Narrative:79 y.o. female with medical history significant of hypertension, recurrent UTI, chronic kidney disease stage III, excessive daytime sleepiness, gait abnormalities, visual changes, who came in from home with shortness of breath and weakness.  Patient noticed progressive shortness of breath over the last 3 to 4 days.  Associated with some cough.  She felt very weak.  No fever or chills.  She has been weak that she has been mostly in bed.  She came to the ER where she was evaluated.  Patient was noted to be mildly hypoxic.  She also requires about 2 L of oxygen now.  She is COVID-19 negative but has evidence of pneumonia versus CHF.  Her BNP is elevated but also has leukocytosis and infiltrates that could be technically speaking and pneumonia.  She is got elevated creatinine which is slightly above her baseline.  She is therefore being admitted to the hospital for acute respiratory failure probably pneumonia but could be CHF as well..  ED Course: Temperature 98.1 blood pressure 130/59 pulse 108 respiratory rate of 24 oxygen sats 93% on 2 L.  White count is 18.2, hemoglobin 11.3, platelets 238 sodium 132 potassium 3.2 chloride 96 CO2 22 BUN 37 creatinine 2.05 and calcium 8.5.  Glucose 139.  COVID-19 screen is negative.  Urinalysis showed many bacteria with WBC 21-50 but negative leukocytes and nitrite.  Probably asymptomatic bacteriuria.  Chest x-ray showed left basilar opacity consistent with atelectasis versus infiltrate.  BNP is 382.  Patient being admitted with pneumonia as well as suspected cardiac cause.   Assessment & Plan:   Principal Problem:   Acute respiratory failure with hypoxia (HCC) Active Problems:   Spinal stenosis, lumbar   ARF (acute renal failure) (HCC)   Hyponatremia   Leucocytosis   Hypokalemia   Elevated brain natriuretic peptide (BNP) level   Sepsis  ([place])  #1  Sepsis present on admission as evidenced by lactic acidosis secondary to community-acquired pneumonia-  she was tachypneic tachycardic and febrile at 103.1 with lactic acidosis and leukocytosis.  Lactic acid level was 2.2 down to 1.1 Chest x-ray with left basilar opacity consistent with atelectasis or infiltrates. Continue Rocephin and azithromycin. Follow-up blood cultures. Her blood pressure is soft 104/62 continue IV fluids. Patient remains intermittently tachycardic with heart rate 110 and tachypneic Influenza negative Covid negative. WBC worsening 27.6 from 11.4 concerning.  Follow-up blood cultures.  Check MRSA PCR.  #2 acute hypoxic respiratory failure secondary to CAP continue Rocephin azithromycin.  Patient was not on oxygen prior to admission to hospital.  Check pulse ox on room air.  Currently she is satting 98% on 2 L.   #2 mild hyponatremia -sodium 132 likely secondary to #1  #3 AKI on CKD stage IIIb -creatinine 1.89 from 1.70 continue IV fluids.  Follow-up labs in a.m.  #4 elevated BNP?  This is likely related to AKI or obesity.  Echo 02/05/2020 with ejection fraction 65 to 70% with normal LV function no regional wall motion abnormalities.  Grade 1 diastolic dysfunction.  #5  Mildly elevated troponin 61 likely due to demand ischemia from #1 and CKD   Estimated body mass index is 36.25 kg/m as calculated from the following:   Height as of this encounter: 5' (1.524 m).   Weight as of this encounter: 84.2 kg.  DVT prophylaxis: Lovenox  code Status: Full code  family Communication: None at bedside  disposition Plan:  Status is: Inpatient  Dispo: The patient is from: Home              Anticipated d/c is to: Home              Anticipated d/c date is: 2 days              Patient currently is not medically stable to d/c.    Consultants:   None  Procedures: None Antimicrobials: Rocephin and azithromycin  Subjective: Patient resting in bed seen in the  ER feels better than yesterday however continues with shortness of breath on oxygen  Objective: Vitals:   02/05/20 0715 02/05/20 0733 02/05/20 0736 02/05/20 0738  BP: 104/62     Pulse: 73     Resp: 15     Temp: 97.7 F (36.5 C)     TempSrc:      SpO2: 100% 98% 98% 98%  Weight:      Height:        Intake/Output Summary (Last 24 hours) at 02/05/2020 1215 Last data filed at 02/05/2020 0946 Gross per 24 hour  Intake 988.78 ml  Output 525 ml  Net 463.78 ml   Filed Weights   02/04/20 1300  Weight: 84.2 kg    Examination:  General exam: Appears calm and comfortable  Respiratory system: Coarse breath sounds bases to auscultation. Respiratory effort normal. Cardiovascular system: S1 & S2 heard, RRR. No JVD, murmurs, rubs, gallops or clicks. No pedal edema. Gastrointestinal system: Abdomen is nondistended, soft and nontender. No organomegaly or masses felt. Normal bowel sounds heard. Central nervous system: Alert and oriented. No focal neurological deficits. Extremities: Symmetric 5 x 5 power. Skin: No rashes, lesions or ulcers Psychiatry: Judgement and insight appear normal. Mood & affect appropriate.     Data Reviewed: I have personally reviewed following labs and imaging studies  CBC: Recent Labs  Lab 02/03/20 1820 02/04/20 0625 02/04/20 2035 02/05/20 0417  WBC 18.2* 11.9* 11.4* 27.6*  NEUTROABS 16.4* 11.2* 10.4*  --   HGB 11.3* 9.0* 10.5* 8.4*  HCT 35.8* 28.0* 33.2* 26.2*  MCV 90.4 89.2 91.0 90.0  PLT 238 187 189 681   Basic Metabolic Panel: Recent Labs  Lab 02/03/20 1820 02/04/20 0625 02/04/20 2035 02/05/20 0417  NA 132* 133* 133* 132*  K 3.2* 4.0 3.5 3.5  CL 96* 99 100 101  CO2 22 22 19* 22  GLUCOSE 139* 168* 106* 190*  BUN 37* 39* 40* 41*  CREATININE 2.05* 1.75* 1.70* 1.89*  CALCIUM 8.5* 8.2* 8.5* 8.0*   GFR: Estimated Creatinine Clearance: 22.9 mL/min (A) (by C-G formula based on SCr of 1.89 mg/dL (H)). Liver Function Tests: Recent Labs  Lab  02/03/20 1820 02/04/20 0625 02/04/20 2035 02/05/20 0417  AST 23 17 20 17   ALT 20 16 18 16   ALKPHOS 187* 135* 261* 134*  BILITOT 1.2 0.8 0.9 0.6  PROT 7.7 6.3* 6.8 5.7*  ALBUMIN 3.1* 2.5* 2.8* 2.2*   No results for input(s): LIPASE, AMYLASE in the last 168 hours. No results for input(s): AMMONIA in the last 168 hours. Coagulation Profile: No results for input(s): INR, PROTIME in the last 168 hours. Cardiac Enzymes: Recent Labs  Lab 02/03/20 1820  CKTOTAL 60   BNP (last 3 results) No results for input(s): PROBNP in the last 8760 hours. HbA1C: No results for input(s): HGBA1C in the last 72 hours. CBG: No results for input(s): GLUCAP in the last 168 hours. Lipid Profile: No results for input(s):  CHOL, HDL, LDLCALC, TRIG, CHOLHDL, LDLDIRECT in the last 72 hours. Thyroid Function Tests: No results for input(s): TSH, T4TOTAL, FREET4, T3FREE, THYROIDAB in the last 72 hours. Anemia Panel: No results for input(s): VITAMINB12, FOLATE, FERRITIN, TIBC, IRON, RETICCTPCT in the last 72 hours. Sepsis Labs: Recent Labs  Lab 02/04/20 1533 02/04/20 1749 02/04/20 2035 02/04/20 2327  LATICACIDVEN 1.4 1.3 2.2* 1.1    Recent Results (from the past 240 hour(s))  Resp Panel by RT PCR (RSV, Flu A&B, Covid) - Nasopharyngeal Swab     Status: None   Collection Time: 02/03/20  6:25 PM   Specimen: Nasopharyngeal Swab  Result Value Ref Range Status   SARS Coronavirus 2 by RT PCR NEGATIVE NEGATIVE Final    Comment: (NOTE) SARS-CoV-2 target nucleic acids are NOT DETECTED.  The SARS-CoV-2 RNA is generally detectable in upper respiratoy specimens during the acute phase of infection. The lowest concentration of SARS-CoV-2 viral copies this assay can detect is 131 copies/mL. A negative result does not preclude SARS-Cov-2 infection and should not be used as the sole basis for treatment or other patient management decisions. A negative result may occur with  improper specimen collection/handling,  submission of specimen other than nasopharyngeal swab, presence of viral mutation(s) within the areas targeted by this assay, and inadequate number of viral copies (<131 copies/mL). A negative result must be combined with clinical observations, patient history, and epidemiological information. The expected result is Negative.  Fact Sheet for Patients:  PinkCheek.be  Fact Sheet for Healthcare Providers:  GravelBags.it  This test is no t yet approved or cleared by the [place] FDA and  has been authorized for detection and/or diagnosis of SARS-CoV-2 by FDA under an Emergency Use Authorization (EUA). This EUA will remain  in effect (meaning this test can be used) for the duration of the COVID-19 declaration under Section 564(b)(1) of the Act, 21 U.S.C. section 360bbb-3(b)(1), unless the authorization is terminated or revoked sooner.     Influenza A by PCR NEGATIVE NEGATIVE Final   Influenza B by PCR NEGATIVE NEGATIVE Final    Comment: (NOTE) The Xpert Xpress SARS-CoV-2/FLU/RSV assay is intended as an aid in  the diagnosis of influenza from Nasopharyngeal swab specimens and  should not be used as a sole basis for treatment. Nasal washings and  aspirates are unacceptable for Xpert Xpress SARS-CoV-2/FLU/RSV  testing.  Fact Sheet for Patients: PinkCheek.be  Fact Sheet for Healthcare Providers: GravelBags.it  This test is not yet approved or cleared by the [place] FDA and  has been authorized for detection and/or diagnosis of SARS-CoV-2 by  FDA under an Emergency Use Authorization (EUA). This EUA will remain  in effect (meaning this test can be used) for the duration of the  Covid-19 declaration under Section 564(b)(1) of the Act, 21  U.S.C. section 360bbb-3(b)(1), unless the authorization is  terminated or revoked.    Respiratory Syncytial Virus by PCR  NEGATIVE NEGATIVE Final    Comment: (NOTE) Fact Sheet for Patients: PinkCheek.be  Fact Sheet for Healthcare Providers: GravelBags.it  This test is not yet approved or cleared by the [place] FDA and  has been authorized for detection and/or diagnosis of SARS-CoV-2 by  FDA under an Emergency Use Authorization (EUA). This EUA will remain  in effect (meaning this test can be used) for the duration of the  COVID-19 declaration under Section 564(b)(1) of the Act, 21 U.S.C.  section 360bbb-3(b)(1), unless the authorization is terminated or  revoked. Performed at [facility],  [place] [address] [place], [place] [address]   Culture, blood (routine x 2) Call MD if unable to obtain prior to antibiotics being given     Status: None (Preliminary result)   Collection Time: 02/04/20  6:45 AM   Specimen: BLOOD  Result Value Ref Range Status   Specimen Description   Final    BLOOD LEFT ANTECUBITAL Performed at [facility] [address] [place], [place] [address]    Special Requests   Final    BOTTLES DRAWN AEROBIC AND ANAEROBIC Blood Culture adequate volume Performed at [facility] [address], [place], [place] [address]    Culture   Final    NO GROWTH < 24 HOURS Performed at [facility] [address] [place], [place] [address]    Report Status PENDING  Incomplete  Culture, blood (routine x 2) Call MD if unable to obtain prior to antibiotics being given     Status: None (Preliminary result)   Collection Time: 02/04/20  7:10 AM   Specimen: BLOOD RIGHT HAND  Result Value Ref Range Status   Specimen Description   Final    BLOOD RIGHT HAND Performed at [facility] [address] [place], [place] [address]    Special Requests   Final    BOTTLES DRAWN AEROBIC AND ANAEROBIC Blood Culture adequate volume Performed at [facility] [address] [place], [place] [address]    Culture   Final    NO GROWTH < 24 HOURS Performed at [facility] [address] [place],  [address]    Report Status PENDING  Incomplete         Radiology Studies: DG CHEST PORT 1 VIEW  Result Date: 02/04/2020 CLINICAL DATA:  Shortness of breath. EXAM: PORTABLE CHEST 1 VIEW COMPARISON:  February 03, 2020 FINDINGS: Mild, stable, diffuse chronic appearing increased interstitial lung markings are seen. Mild, stable atelectasis is noted within the lateral aspect of the left lung base. There is no evidence of a pleural effusion or pneumothorax. The heart size and mediastinal contours are within normal limits. Radiopaque and lucent artifact is seen overlying the lateral aspect of the mid left chest wall. The visualized skeletal structures are otherwise unremarkable. IMPRESSION: Mild, stable left basilar atelectasis. Electronically Signed   By: Virgina Norfolk M.D.   On: 02/04/2020 20:00   DG Chest Port 1 View  Result Date: 02/03/2020 CLINICAL DATA:  Shortness of breath and cough for 1 month EXAM: PORTABLE CHEST 1 VIEW COMPARISON:  None. FINDINGS: Cardiac shadow is at the upper limits of normal in size. Aortic calcifications are seen. The lungs are well aerated bilaterally with mild left basilar atelectasis/early infiltrate. No bony abnormality is noted. IMPRESSION: Left basilar opacity consistent with atelectasis/infiltrate. Electronically Signed   By: Inez Catalina M.D.   On: 02/03/2020 20:06   ECHOCARDIOGRAM COMPLETE  Result Date: 02/05/2020    ECHOCARDIOGRAM REPORT   Patient Name:   Kathleen Schneider Date of Exam: 02/04/2020 Medical Rec #:  [id number]       Height:       60.0 in Accession #:    [id number]      Weight:       185.6 lb Date of Birth:  1940-10-24       BSA:          1.808 m Patient Age:    35 years        BP:  124/83 mmHg Patient Gender: F               HR:           76 bpm. Exam Location:  Inpatient  Procedure: 2D Echo Indications:    786.09 dyspnea  History:        Patient has no prior history of Echocardiogram examinations.                 Risk Factors:Former Smoker and Hypertension.  Sonographer:    Jannett Celestine RDCS (AE) Referring Phys: [id number] Julious Oka  Sonographer Comments: Image acquisition challenging due to patient body habitus. IMPRESSIONS  1. Left ventricular ejection fraction, by estimation, is 65 to 70%. The left ventricle has normal function. The left ventricle has no regional wall motion abnormalities. Left ventricular diastolic parameters are consistent with Grade I diastolic dysfunction (impaired relaxation). Elevated left atrial pressure.  2. Right ventricular systolic function is normal. The right ventricular size is normal. There is normal pulmonary artery systolic pressure.  3. The mitral valve is normal in structure. Mild mitral valve regurgitation. No evidence of mitral stenosis.  4. The aortic valve is normal in structure. Aortic valve regurgitation is not visualized. Mild to moderate aortic valve sclerosis/calcification is present, without any evidence of aortic stenosis.  5. The inferior vena cava is normal in size with greater than 50% respiratory variability, suggesting right atrial pressure of 3 mmHg. Comparison(s): No prior Echocardiogram. FINDINGS  Left Ventricle: Left ventricular ejection fraction, by estimation, is 65 to 70%. The left ventricle has normal function. The left ventricle has no regional wall motion abnormalities. The left ventricular internal cavity size was normal in size. There is  no left ventricular hypertrophy. Left ventricular diastolic parameters are consistent with Grade I diastolic dysfunction (impaired relaxation). Elevated left atrial pressure. Right Ventricle: The right ventricular size is normal. No increase in right ventricular wall thickness. Right ventricular systolic function is normal. There is normal pulmonary artery systolic pressure. Left  Atrium: Left atrial size was normal in size. Right Atrium: Right atrial size was normal in size. Pericardium: There is no evidence of pericardial effusion. Mitral Valve: The mitral valve is normal in structure. Mild mitral valve regurgitation. No evidence of mitral valve stenosis. Tricuspid Valve: The tricuspid valve is normal in structure. Tricuspid valve regurgitation is trivial. No evidence of tricuspid stenosis. Aortic Valve: The aortic valve is normal in structure. Aortic valve regurgitation is not visualized. Mild to moderate aortic valve sclerosis/calcification is present, without any evidence of aortic stenosis. Pulmonic Valve: The pulmonic valve was normal in structure. Pulmonic valve regurgitation is not visualized. No evidence of pulmonic stenosis. Aorta: The aortic root is normal in size and structure. Venous: The inferior vena cava is normal in size with greater than 50% respiratory variability, suggesting right atrial pressure of 3 mmHg. IAS/Shunts: No atrial level shunt detected by color flow Doppler.  LEFT VENTRICLE PLAX 2D LVIDd:         5.07 cm  Diastology LVIDs:         2.88 cm  LV e' medial:    8.38 cm/s LV PW:         0.85 cm  LV E/e' medial:  11.8 LV IVS:        0.90 cm  LV e' lateral:   8.05 cm/s LVOT diam:     2.20 cm  LV E/e' lateral: 12.2 LV SV:         97 LV SV Index:  54 LVOT Area:     3.80 cm  RIGHT VENTRICLE RV S prime:     11.10 cm/s TAPSE (M-mode): 1.8 cm LEFT ATRIUM           Index LA diam:      4.00 cm 2.21 cm/m LA Vol (A2C): 55.8 ml 30.86 ml/m  AORTIC VALVE LVOT Vmax:   99.80 cm/s LVOT Vmean:  65.100 cm/s LVOT VTI:    0.256 m  AORTA Ao Root diam: 2.70 cm MITRAL VALVE MV Area (PHT): 3.27 cm    SHUNTS MV Decel Time: 232 msec    Systemic VTI:  0.26 m MV E velocity: 98.50 cm/s  Systemic Diam: 2.20 cm MV A velocity: 88.30 cm/s MV E/A ratio:  1.12 Ena Dawley MD Electronically signed by Ena Dawley MD Signature Date/Time: 02/05/2020/9:18:19 AM    Final          Scheduled Meds: . enoxaparin (LOVENOX) injection  30 mg Subcutaneous Q24H  . escitalopram  5 mg Oral Daily  . ipratropium-albuterol  3 mL Nebulization BID  . lamoTRIgine  150 mg Oral BID  . oxybutynin  5 mg Oral BID   Continuous Infusions: . sodium chloride 100 mL/hr at 02/04/20 2030  . azithromycin 500 mg (02/04/20 2135)  . cefTRIAXone (ROCEPHIN)  IV 2 g (02/04/20 2050)     LOS: 2 days   Georgette Shell, MD 02/05/2020, 12:15 PM

## 2020-02-05 NOTE — Evaluation (Signed)
Physical Therapy Evaluation Patient Details Name: Kathleen Schneider MRN: [id number] DOB: Jun 28, 1940 Today's Date: 02/05/2020   History of Present Illness  79 yo female admitted with Pna, Sepsis after falling at home. Hx of CKD, gait abnormality, visual deficits, spinal stenosis  Clinical Impression  On eval, pt required Min assist (+2 for safety/equipment) for mobility. She was able to walk ~5 feet with use of a RW. Pt presents with general weakness, decreased activity tolerance, and impaired gait and balance. She is at high risk for further falls. O2 93% on RA at rest, 88% on RA with activity. Dyspnea 2/4. Pt had several tearful outbursts during session. Pt stated she was upset and frustrated with her current situation. Recommendation is for ST SNF rehab at this time. Will plan to follow and progress activity as safely able.     Follow Up Recommendations SNF    Equipment Recommendations  None recommended by PT    Recommendations for Other Services       Precautions / Restrictions Precautions Precautions: Fall Precaution Comments: monitor O2 Restrictions Weight Bearing Restrictions: No      Mobility  Bed Mobility Overal bed mobility: Needs Assistance Bed Mobility: Supine to Sit     Supine to sit: [facility] elevated;Min assist     General bed mobility comments: Increased time and effort for pt. Assist to get trunk fully upright and to scoot to EOB. Pt relied heavily on bedrail.    Transfers Overall transfer level: Needs assistance Equipment used: Rolling walker (2 wheeled) Transfers: Sit to/from [facility] Sit to Stand: Min assist Stand pivot transfers: Min assist       General transfer comment: x 3. Assist to power up, stabilize, control descent. Posterior bias initially. Some R side leaning as well. Cues for safety, technique, hand placement.  Ambulation/Gait Ambulation/Gait assistance: Min assist;+2 safety/equipment Gait Distance (Feet): 5  Feet Assistive device: Rolling walker (2 wheeled) Gait Pattern/deviations: Step-to pattern;Decreased stride length;Decreased step length - right;Decreased step length - left;Trunk flexed     General Gait Details: Cues for posture, RW proximity, proper use of RW. Assist to stabilize/support pt and to follow with recliner. O2 88% on RA, dyspnea 2/4. High fall risk.  Stairs            Wheelchair Mobility    Modified Rankin (Stroke Patients Only)       Balance Overall balance assessment: Needs assistance         Standing balance support: Bilateral upper extremity supported Standing balance-[name]: Poor                               Pertinent Vitals/Pain Pain Assessment: Faces Faces Pain Scale: No hurt    Home Living Family/patient expects to be discharged to:: Private residence Living Arrangements: Alone Available Help at Discharge: Family;Available PRN/intermittently Type of Home: House Home Access: Stairs to enter Entrance Stairs-Rails:  (holds on to side of building and/or door frame) Entrance Stairs-Number of Steps: 3 Home Layout: Two level;Able to live on main level with bedroom/bathroom Home Equipment: Gilford Rile - 2 wheels;Cane - single point      Prior Function Level of Independence: Independent with assistive device(s)         Comments: uses RW for ambulation     Hand Dominance        Extremity/Trunk Assessment   Upper Extremity Assessment Upper Extremity Assessment: Defer to OT evaluation    Lower Extremity Assessment  Lower Extremity Assessment: Generalized weakness    Cervical / Trunk Assessment Cervical / Trunk Assessment: Normal  Communication   Communication: No difficulties  Cognition Arousal/Alertness: Awake/alert Behavior During Therapy: WFL for tasks assessed/performed                                   General Comments: Pt was crying uncontrollably at times during sesison. Pt denied pain. She  stated she was just upset about her situation      General Comments      Exercises     Assessment/Plan    PT Assessment Patient needs continued PT services  PT Problem List Decreased strength;Decreased mobility;Decreased activity tolerance;Decreased knowledge of use of DME;Decreased balance       PT Treatment Interventions DME instruction;Gait training;Therapeutic activities;Therapeutic exercise;Patient/family education;Functional mobility training;Balance training    PT Goals (Current goals can be found in the Care Plan section)  Acute Rehab PT Goals Patient Stated Goal: to regain independence and PLOF PT Goal Formulation: With patient Time For Goal Achievement: 02/19/20 Potential to Achieve Goals: Fair    Frequency Min 3X/week   Barriers to discharge        Co-evaluation               AM-PAC PT "6 Clicks" Mobility  Outcome Measure Help needed turning from your back to your side while in a flat bed without using bedrails?: A Little Help needed moving from lying on your back to sitting on the side of a flat bed without using bedrails?: A Little Help needed moving to and from a bed to a chair (including a wheelchair)?: A Little Help needed standing up from a chair using your arms (e.g., wheelchair or bedside chair)?: A Little Help needed to walk in hospital room?: A Lot Help needed climbing 3-5 steps with a railing? : A Lot 6 Click Score: 16    End of Session Equipment Utilized During Treatment: Gait belt Activity Tolerance: Patient limited by fatigue Patient left: in chair;with call bell/phone within reach   PT Visit Diagnosis: Muscle weakness (generalized) (M62.81);History of falling (Z91.81);Unsteadiness on feet (R26.81)    Time: 2376-2831 PT Time Calculation (min) (ACUTE ONLY): 37 min   Charges:   PT Evaluation $PT Eval Moderate Complexity: 1 Mod PT Treatments $Gait Training: 8-22 mins          Doreatha Massed, PT Acute Rehabilitation  Office:  [phone] Pager: [phone]

## 2020-02-06 DIAGNOSIS — J9601 Acute respiratory failure with hypoxia: Secondary | ICD-10-CM | POA: Diagnosis not present

## 2020-02-06 LAB — COMPREHENSIVE METABOLIC PANEL
ALT: 20 U/L (ref 0–44)
AST: 21 U/L (ref 15–41)
Albumin: 2.2 g/dL — ABNORMAL LOW (ref 3.5–5.0)
Alkaline Phosphatase: 120 U/L (ref 38–126)
Anion gap: 10 (ref 5–15)
BUN: 37 mg/dL — ABNORMAL HIGH (ref 8–23)
CO2: 22 mmol/L (ref 22–32)
Calcium: 8.4 mg/dL — ABNORMAL LOW (ref 8.9–10.3)
Chloride: 105 mmol/L (ref 98–111)
Creatinine, Ser: 1.54 mg/dL — ABNORMAL HIGH (ref 0.44–1.00)
GFR, Estimated: 34 mL/min — ABNORMAL LOW (ref >60)
Glucose, Bld: 146 mg/dL — ABNORMAL HIGH (ref 70–99)
Potassium: 3 mmol/L — ABNORMAL LOW (ref 3.5–5.1)
Sodium: 137 mmol/L (ref 135–145)
Total Bilirubin: 0.5 mg/dL (ref 0.3–1.2)
Total Protein: 5.6 g/dL — ABNORMAL LOW (ref 6.5–8.1)

## 2020-02-06 LAB — CBC
HCT: 27.2 % — ABNORMAL LOW (ref 36.0–46.0)
Hemoglobin: 8.5 g/dL — ABNORMAL LOW (ref 12.0–15.0)
MCH: 28.5 pg (ref 26.0–34.0)
MCHC: 31.3 g/dL (ref 30.0–36.0)
MCV: 91.3 fL (ref 80.0–100.0)
Platelets: 152 10*3/uL (ref 150–400)
RBC: 2.98 MIL/uL — ABNORMAL LOW (ref 3.87–5.11)
RDW: 17.2 % — ABNORMAL HIGH (ref 11.5–15.5)
WBC: 15.4 10*3/uL — ABNORMAL HIGH (ref 4.0–10.5)
nRBC: 0 % (ref 0.0–0.2)

## 2020-02-06 LAB — BLOOD CULTURE ID PANEL (REFLEXED) - BCID2

## 2020-02-06 LAB — CULTURE, BLOOD (ROUTINE X 2): Special Requests: ADEQUATE

## 2020-02-06 MED ORDER — AZITHROMYCIN 250 MG PO TABS
500.0000 mg | ORAL_TABLET | Freq: Every day | ORAL | Status: AC
  Administered 2020-02-06 – 2020-02-07 (×2): 500 mg via ORAL
  Filled 2020-02-06 (×2): qty 2

## 2020-02-06 MED ORDER — IPRATROPIUM-ALBUTEROL 0.5-2.5 (3) MG/3ML IN SOLN
3.0000 mL | Freq: Four times a day (QID) | RESPIRATORY_TRACT | Status: DC | PRN
  Administered 2020-02-07: 3 mL via RESPIRATORY_TRACT
  Filled 2020-02-06: qty 3

## 2020-02-06 MED ORDER — POTASSIUM CHLORIDE CRYS ER 20 MEQ PO TBCR
40.0000 meq | EXTENDED_RELEASE_TABLET | Freq: Once | ORAL | Status: AC
  Administered 2020-02-06: 40 meq via ORAL
  Filled 2020-02-06: qty 2

## 2020-02-06 NOTE — Progress Notes (Signed)
PHARMACIST - PHYSICIAN COMMUNICATION  CONCERNING: Antibiotic IV to Oral Route Change Policy  RECOMMENDATION: This patient is receiving azithromycin by the intravenous route.  Based on criteria approved by the Pharmacy and Therapeutics Committee, the antibiotic(s) is/are being converted to the equivalent oral dose form(s).   DESCRIPTION: These criteria include:  Patient being treated for a respiratory tract infection, urinary tract infection, cellulitis or clostridium difficile associated diarrhea if on metronidazole  The patient is not neutropenic and does not exhibit a GI malabsorption state  The patient is eating (either orally or via tube) and/or has been taking other orally administered medications for a least 24 hours  The patient is improving clinically and has a Tmax < 100.5  If you have questions about this conversion, please contact the Pharmacy Department  []  ( [phone] )   []  ( [phone]  [facility] []  ( [phone]  [place] []  ( [phone] )  [facility] [x]  ( [phone] )  [facility]  

## 2020-02-06 NOTE — Progress Notes (Signed)
PROGRESS NOTE    Kathleen Schneider  JIR:[id number] DOB: [birth year] DOA: 02/03/2020 PCP: Donald Prose, MD   Brief Narrative:79 y.o. female with medical history significant of hypertension, recurrent UTI, chronic kidney disease stage III, excessive daytime sleepiness, gait abnormalities, visual changes, who came in from home with shortness of breath and weakness.  Patient noticed progressive shortness of breath over the last 3 to 4 days.  Associated with some cough.  She felt very weak.  No fever or chills.  She has been weak that she has been mostly in bed.  She came to the ER where she was evaluated.  Patient was noted to be mildly hypoxic.  She also requires about 2 L of oxygen now.  She is COVID-19 negative but has evidence of pneumonia versus CHF.  Her BNP is elevated but also has leukocytosis and infiltrates that could be technically speaking and pneumonia.  She is got elevated creatinine which is slightly above her baseline.  She is therefore being admitted to the hospital for acute respiratory failure probably pneumonia but could be CHF as well..  ED Course: Temperature 98.1 blood pressure 130/59 pulse 108 respiratory rate of 24 oxygen sats 93% on 2 L.  White count is 18.2, hemoglobin 11.3, platelets 238 sodium 132 potassium 3.2 chloride 96 CO2 22 BUN 37 creatinine 2.05 and calcium 8.5.  Glucose 139.  COVID-19 screen is negative.  Urinalysis showed many bacteria with WBC 21-50 but negative leukocytes and nitrite.  Probably asymptomatic bacteriuria.  Chest x-ray showed left basilar opacity consistent with atelectasis versus infiltrate.  BNP is 382.  Patient being admitted with pneumonia as well as suspected cardiac cause.   Assessment & Plan:   Principal Problem:   Acute respiratory failure with hypoxia (HCC) Active Problems:   Spinal stenosis, lumbar   ARF (acute renal failure) (HCC)   Hyponatremia   Leucocytosis   Hypokalemia   Elevated brain natriuretic peptide (BNP) level   Sepsis  ([place])  #1  Sepsis present on admission as evidenced by lactic acidosis secondary to community-acquired pneumonia-  she was tachypneic tachycardic and febrile at 103.1 with lactic acidosis and leukocytosis.  Lactic acid level was 2.2 down to 1.1 Chest x-ray with left basilar opacity consistent with atelectasis or infiltrates. Continue Rocephin and azithromycin. Follow-up blood cultures-staph epidermidis. Her blood pressure is soft 104/62 continue IV fluids. Patient remains intermittently tachycardic with heart rate 110 and tachypneic Influenza negative Covid negative. Leukocytosis improving down to 15.4 from 27. Continue current IV antibiotics. MRSA PCR negative.  #2 acute hypoxic respiratory failure secondary to CAP continue Rocephin azithromycin.  Patient was not on oxygen prior to admission to hospital.  Check pulse ox on room air.  Currently she is satting 98% on 2 L.   #2 mild hyponatremia -sodium 137. Resolved.   #3 AKI on CKD stage IIIb -creatinine 1.54 from 1.89 from 1.70 with IV fluids.  #4 elevated BNP?  This is likely related to AKI or obesity.  Echo 02/05/2020 with ejection fraction 65 to 70% with normal LV function no regional wall motion abnormalities.  Grade 1 diastolic dysfunction.  #5  Mildly elevated troponin 61 likely due to demand ischemia from #1 and CKD  #6 deconditioning patient lives alone seen by physical therapy recommending SNF Case manager aware.   Estimated body mass index is 36.25 kg/m as calculated from the following:   Height as of this encounter: 5' (1.524 m).   Weight as of this encounter: 84.2 kg.  DVT prophylaxis: Lovenox  code Status: Full code  family Communication: None at bedside  disposition Plan:  Status is: Inpatient  Dispo: The patient is from: Home              Anticipated d/c is to: Home              Anticipated d/c date is: 2 days              Patient currently is not medically stable to d/c.    Consultants:    None  Procedures: None Antimicrobials: Rocephin and azithromycin  Subjective: Patient is resting in bed. She has some memory deficits. She lives alone. She did not know how to use incentive spirometry though the nurses shown her earlier today how to use it. Objective: Vitals:   02/05/20 2104 02/05/20 2212 02/06/20 0449 02/06/20 0732  BP: 115/60  (!) 115/58   Pulse: 72  75   Resp: 18  17   Temp: 97.9 F (36.6 C)  97.9 F (36.6 C)   TempSrc: Oral  Oral   SpO2: 100% 95% 99% 95%  Weight:      Height:        Intake/Output Summary (Last 24 hours) at 02/06/2020 1215 Last data filed at 02/06/2020 0500 Gross per 24 hour  Intake 2955.6 ml  Output 950 ml  Net 2005.6 ml   Filed Weights   02/04/20 1300  Weight: 84.2 kg    Examination:  General exam: Appears calm and comfortable  Respiratory system: Coarse breath sounds bases to auscultation. Respiratory effort normal. Cardiovascular system: S1 & S2 heard, RRR. No JVD, murmurs, rubs, gallops or clicks. No pedal edema. Gastrointestinal system: Abdomen is nondistended, soft and nontender. No organomegaly or masses felt. Normal bowel sounds heard. Central nervous system: Alert and oriented. No focal neurological deficits. Extremities: Symmetric 5 x 5 power. Skin: No rashes, lesions or ulcers Psychiatry: Judgement and insight appear normal. Mood & affect appropriate.     Data Reviewed: I have personally reviewed following labs and imaging studies  CBC: Recent Labs  Lab 02/03/20 1820 02/04/20 0625 02/04/20 2035 02/05/20 0417 02/06/20 0408  WBC 18.2* 11.9* 11.4* 27.6* 15.4*  NEUTROABS 16.4* 11.2* 10.4*  --   --   HGB 11.3* 9.0* 10.5* 8.4* 8.5*  HCT 35.8* 28.0* 33.2* 26.2* 27.2*  MCV 90.4 89.2 91.0 90.0 91.3  PLT 238 187 189 167 643   Basic Metabolic Panel: Recent Labs  Lab 02/03/20 1820 02/04/20 0625 02/04/20 2035 02/05/20 0417 02/06/20 0408  NA 132* 133* 133* 132* 137  K 3.2* 4.0 3.5 3.5 3.0*  CL 96* 99 100  101 105  CO2 22 22 19* 22 22  GLUCOSE 139* 168* 106* 190* 146*  BUN 37* 39* 40* 41* 37*  CREATININE 2.05* 1.75* 1.70* 1.89* 1.54*  CALCIUM 8.5* 8.2* 8.5* 8.0* 8.4*   GFR: Estimated Creatinine Clearance: 28.1 mL/min (A) (by C-G formula based on SCr of 1.54 mg/dL (H)). Liver Function Tests: Recent Labs  Lab 02/03/20 1820 02/04/20 0625 02/04/20 2035 02/05/20 0417 02/06/20 0408  AST 23 17 20 17 21   ALT 20 16 18 16 20   ALKPHOS 187* 135* 261* 134* 120  BILITOT 1.2 0.8 0.9 0.6 0.5  PROT 7.7 6.3* 6.8 5.7* 5.6*  ALBUMIN 3.1* 2.5* 2.8* 2.2* 2.2*   No results for input(s): LIPASE, AMYLASE in the last 168 hours. No results for input(s): AMMONIA in the last 168 hours. Coagulation Profile: No results for input(s): INR, PROTIME in the last 168 hours.  Cardiac Enzymes: Recent Labs  Lab 02/03/20 1820  CKTOTAL 60   BNP (last 3 results) No results for input(s): PROBNP in the last 8760 hours. HbA1C: No results for input(s): HGBA1C in the last 72 hours. CBG: No results for input(s): GLUCAP in the last 168 hours. Lipid Profile: No results for input(s): CHOL, HDL, LDLCALC, TRIG, CHOLHDL, LDLDIRECT in the last 72 hours. Thyroid Function Tests: No results for input(s): TSH, T4TOTAL, FREET4, T3FREE, THYROIDAB in the last 72 hours. Anemia Panel: No results for input(s): VITAMINB12, FOLATE, FERRITIN, TIBC, IRON, RETICCTPCT in the last 72 hours. Sepsis Labs: Recent Labs  Lab 02/04/20 1533 02/04/20 1749 02/04/20 2035 02/04/20 2327  LATICACIDVEN 1.4 1.3 2.2* 1.1    Recent Results (from the past 240 hour(s))  Resp Panel by RT PCR (RSV, Flu A&B, Covid) - Nasopharyngeal Swab     Status: None   Collection Time: 02/03/20  6:25 PM   Specimen: Nasopharyngeal Swab  Result Value Ref Range Status   SARS Coronavirus 2 by RT PCR NEGATIVE NEGATIVE Final    Comment: (NOTE) SARS-CoV-2 target nucleic acids are NOT DETECTED.  The SARS-CoV-2 RNA is generally detectable in upper respiratoy specimens  during the acute phase of infection. The lowest concentration of SARS-CoV-2 viral copies this assay can detect is 131 copies/mL. A negative result does not preclude SARS-Cov-2 infection and should not be used as the sole basis for treatment or other patient management decisions. A negative result may occur with  improper specimen collection/handling, submission of specimen other than nasopharyngeal swab, presence of viral mutation(s) within the areas targeted by this assay, and inadequate number of viral copies (<131 copies/mL). A negative result must be combined with clinical observations, patient history, and epidemiological information. The expected result is Negative.  Fact Sheet for Patients:  PinkCheek.be  Fact Sheet for Healthcare Providers:  GravelBags.it  This test is no t yet approved or cleared by the [place] FDA and  has been authorized for detection and/or diagnosis of SARS-CoV-2 by FDA under an Emergency Use Authorization (EUA). This EUA will remain  in effect (meaning this test can be used) for the duration of the COVID-19 declaration under Section 564(b)(1) of the Act, 21 U.S.C. section 360bbb-3(b)(1), unless the authorization is terminated or revoked sooner.     Influenza A by PCR NEGATIVE NEGATIVE Final   Influenza B by PCR NEGATIVE NEGATIVE Final    Comment: (NOTE) The Xpert Xpress SARS-CoV-2/FLU/RSV assay is intended as an aid in  the diagnosis of influenza from Nasopharyngeal swab specimens and  should not be used as a sole basis for treatment. Nasal washings and  aspirates are unacceptable for Xpert Xpress SARS-CoV-2/FLU/RSV  testing.  Fact Sheet for Patients: PinkCheek.be  Fact Sheet for Healthcare Providers: GravelBags.it  This test is not yet approved or cleared by the [place] FDA and  has been authorized for detection and/or  diagnosis of SARS-CoV-2 by  FDA under an Emergency Use Authorization (EUA). This EUA will remain  in effect (meaning this test can be used) for the duration of the  Covid-19 declaration under Section 564(b)(1) of the Act, 21  U.S.C. section 360bbb-3(b)(1), unless the authorization is  terminated or revoked.    Respiratory Syncytial Virus by PCR NEGATIVE NEGATIVE Final    Comment: (NOTE) Fact Sheet for Patients: PinkCheek.be  Fact Sheet for Healthcare Providers: GravelBags.it  This test is not yet approved or cleared by the [place] FDA and  has been authorized for detection and/or diagnosis of SARS-CoV-2  by  FDA under an Emergency Use Authorization (EUA). This EUA will remain  in effect (meaning this test can be used) for the duration of the  COVID-19 declaration under Section 564(b)(1) of the Act, 21 U.S.C.  section 360bbb-3(b)(1), unless the authorization is terminated or  revoked. Performed at [facility], [place] [address] [place], [place] [address]   Culture, blood (routine x 2) Call MD if unable to obtain prior to antibiotics being given     Status: None (Preliminary result)   Collection Time: 02/04/20  6:45 AM   Specimen: BLOOD  Result Value Ref Range Status   Specimen Description   Final    BLOOD LEFT ANTECUBITAL Performed at [facility] [address], [place], [place] [address]    Special Requests   Final    BOTTLES DRAWN AEROBIC AND ANAEROBIC Blood Culture adequate volume Performed at [facility] [address] [place], [place] [address]    Culture  Setup Time   Final    AEROBIC BOTTLE ONLY GRAM POSITIVE COCCI Organism ID to follow CRITICAL RESULT CALLED TO, READ BACK BY AND VERIFIED WITHSeleta Rhymes [facility] 02/06/20 0210 JDW Performed at [facility], [address], [place], [place] [address]    Culture GRAM POSITIVE COCCI  Final    Report Status PENDING  Incomplete  Blood Culture ID Panel (Reflexed)     Status: Abnormal   Collection Time: 02/04/20  6:45 AM  Result Value Ref Range Status   Enterococcus faecalis NOT DETECTED NOT DETECTED Final   Enterococcus Faecium NOT DETECTED NOT DETECTED Final   Listeria monocytogenes NOT DETECTED NOT DETECTED Final   Staphylococcus species DETECTED (A) NOT DETECTED Final    Comment: CRITICAL RESULT CALLED TO, READ BACK BY AND VERIFIED WITH: E JACKSON PHARMD 02/06/20 0210 JDW    Staphylococcus aureus (BCID) NOT DETECTED NOT DETECTED Final   Staphylococcus epidermidis DETECTED (A) NOT DETECTED Final    Comment: Methicillin (oxacillin) resistant coagulase negative staphylococcus. Possible blood culture contaminant (unless isolated from more than one blood culture draw or clinical case suggests pathogenicity). No antibiotic treatment is indicated for blood  culture contaminants. CRITICAL RESULT CALLED TO, READ BACK BY AND VERIFIED WITH: E JACKSON PHARMD 02/06/20 0210 JDW    Staphylococcus lugdunensis NOT DETECTED NOT DETECTED Final   Streptococcus species NOT DETECTED NOT DETECTED Final   Streptococcus agalactiae NOT DETECTED NOT DETECTED Final   Streptococcus pneumoniae NOT DETECTED NOT DETECTED Final   Streptococcus pyogenes NOT DETECTED NOT DETECTED Final   A.calcoaceticus-baumannii NOT DETECTED NOT DETECTED Final   Bacteroides fragilis NOT DETECTED NOT DETECTED Final   Enterobacterales NOT DETECTED NOT DETECTED Final   Enterobacter cloacae complex NOT DETECTED NOT DETECTED Final   Escherichia coli NOT DETECTED NOT DETECTED Final   Klebsiella aerogenes NOT DETECTED NOT DETECTED Final   Klebsiella oxytoca NOT DETECTED NOT DETECTED Final   Klebsiella pneumoniae NOT DETECTED NOT DETECTED Final   Proteus species NOT DETECTED NOT DETECTED Final   Salmonella species NOT DETECTED NOT DETECTED Final   Serratia marcescens NOT DETECTED NOT DETECTED Final   Haemophilus influenzae NOT  DETECTED NOT DETECTED Final   Neisseria meningitidis NOT DETECTED NOT DETECTED Final   Pseudomonas aeruginosa NOT DETECTED NOT DETECTED Final   Stenotrophomonas maltophilia NOT DETECTED NOT DETECTED Final   Candida albicans NOT DETECTED NOT DETECTED Final   Candida auris NOT DETECTED NOT DETECTED Final   Candida glabrata NOT DETECTED NOT DETECTED Final  Candida krusei NOT DETECTED NOT DETECTED Final   Candida parapsilosis NOT DETECTED NOT DETECTED Final   Candida tropicalis NOT DETECTED NOT DETECTED Final   Cryptococcus neoformans/gattii NOT DETECTED NOT DETECTED Final   Methicillin resistance mecA/C DETECTED (A) NOT DETECTED Final    Comment: CRITICAL RESULT CALLED TO, READ BACK BY AND VERIFIED WITHSeleta Rhymes [facility] 02/06/20 0210 JDW Performed at [facility], [place] [address] [place], [place] [address]   Culture, blood (routine x 2) Call MD if unable to obtain prior to antibiotics being given     Status: None (Preliminary result)   Collection Time: 02/04/20  7:10 AM   Specimen: BLOOD RIGHT HAND  Result Value Ref Range Status   Specimen Description   Final    BLOOD RIGHT HAND Performed at [facility] [address] [place], [place] [address]    Special Requests   Final    BOTTLES DRAWN AEROBIC AND ANAEROBIC Blood Culture adequate volume Performed at [facility] [address] [place], [place] [address]    Culture   Final    NO GROWTH 2 DAYS Performed at [facility] [address], [place], [place] [address]    Report Status PENDING  Incomplete  MRSA PCR Screening     Status: None   Collection Time: 02/05/20  1:37 PM   Specimen: Nasal Mucosa; Nasopharyngeal  Result Value Ref Range Status   MRSA by PCR NEGATIVE NEGATIVE Final    Comment:        The GeneXpert MRSA Assay (FDA approved for NASAL specimens only), is one component of a comprehensive MRSA colonization surveillance program. It is not intended to diagnose  MRSA infection nor to guide or monitor treatment for MRSA infections. Performed at [facility], [place] [address] [place], [place] [address]          Radiology Studies: DG CHEST PORT 1 VIEW  Result Date: 02/04/2020 CLINICAL DATA:  Shortness of breath. EXAM: PORTABLE CHEST 1 VIEW COMPARISON:  February 03, 2020 FINDINGS: Mild, stable, diffuse chronic appearing increased interstitial lung markings are seen. Mild, stable atelectasis is noted within the lateral aspect of the left lung base. There is no evidence of a pleural effusion or pneumothorax. The heart size and mediastinal contours are within normal limits. Radiopaque and lucent artifact is seen overlying the lateral aspect of the mid left chest wall. The visualized skeletal structures are otherwise unremarkable. IMPRESSION: Mild, stable left basilar atelectasis. Electronically Signed   By: Virgina Norfolk M.D.   On: 02/04/2020 20:00   ECHOCARDIOGRAM COMPLETE  Result Date: 02/05/2020    ECHOCARDIOGRAM REPORT   Patient Name:   Kathleen Schneider Date of Exam: 02/04/2020 Medical Rec #:  [id number]       Height:       60.0 in Accession #:    [id number]      Weight:       185.6 lb Date of Birth:  [birth year]       BSA:          1.808 m Patient Age:    52 years        BP:           124/83 mmHg Patient Gender: F               HR:           76 bpm. Exam Location:  Inpatient Procedure: 2D Echo Indications:    786.09  dyspnea  History:        Patient has no prior history of Echocardiogram examinations.                 Risk Factors:Former Smoker and Hypertension.  Sonographer:    Jannett Celestine RDCS (AE) Referring Phys: [id number] Julious Oka  Sonographer Comments: Image acquisition challenging due to patient body habitus. IMPRESSIONS  1. Left ventricular ejection fraction, by estimation, is 65 to 70%. The left ventricle has normal function. The left ventricle has no regional wall motion abnormalities. Left ventricular diastolic parameters  are consistent with Grade I diastolic dysfunction (impaired relaxation). Elevated left atrial pressure.  2. Right ventricular systolic function is normal. The right ventricular size is normal. There is normal pulmonary artery systolic pressure.  3. The mitral valve is normal in structure. Mild mitral valve regurgitation. No evidence of mitral stenosis.  4. The aortic valve is normal in structure. Aortic valve regurgitation is not visualized. Mild to moderate aortic valve sclerosis/calcification is present, without any evidence of aortic stenosis.  5. The inferior vena cava is normal in size with greater than 50% respiratory variability, suggesting right atrial pressure of 3 mmHg. Comparison(s): No prior Echocardiogram. FINDINGS  Left Ventricle: Left ventricular ejection fraction, by estimation, is 65 to 70%. The left ventricle has normal function. The left ventricle has no regional wall motion abnormalities. The left ventricular internal cavity size was normal in size. There is  no left ventricular hypertrophy. Left ventricular diastolic parameters are consistent with Grade I diastolic dysfunction (impaired relaxation). Elevated left atrial pressure. Right Ventricle: The right ventricular size is normal. No increase in right ventricular wall thickness. Right ventricular systolic function is normal. There is normal pulmonary artery systolic pressure. Left Atrium: Left atrial size was normal in size. Right Atrium: Right atrial size was normal in size. Pericardium: There is no evidence of pericardial effusion. Mitral Valve: The mitral valve is normal in structure. Mild mitral valve regurgitation. No evidence of mitral valve stenosis. Tricuspid Valve: The tricuspid valve is normal in structure. Tricuspid valve regurgitation is trivial. No evidence of tricuspid stenosis. Aortic Valve: The aortic valve is normal in structure. Aortic valve regurgitation is not visualized. Mild to moderate aortic valve  sclerosis/calcification is present, without any evidence of aortic stenosis. Pulmonic Valve: The pulmonic valve was normal in structure. Pulmonic valve regurgitation is not visualized. No evidence of pulmonic stenosis. Aorta: The aortic root is normal in size and structure. Venous: The inferior vena cava is normal in size with greater than 50% respiratory variability, suggesting right atrial pressure of 3 mmHg. IAS/Shunts: No atrial level shunt detected by color flow Doppler.  LEFT VENTRICLE PLAX 2D LVIDd:         5.07 cm  Diastology LVIDs:         2.88 cm  LV e' medial:    8.38 cm/s LV PW:         0.85 cm  LV E/e' medial:  11.8 LV IVS:        0.90 cm  LV e' lateral:   8.05 cm/s LVOT diam:     2.20 cm  LV E/e' lateral: 12.2 LV SV:         97 LV SV Index:   54 LVOT Area:     3.80 cm  RIGHT VENTRICLE RV S prime:     11.10 cm/s TAPSE (M-mode): 1.8 cm LEFT ATRIUM           Index LA diam:  4.00 cm 2.21 cm/m LA Vol (A2C): 55.8 ml 30.86 ml/m  AORTIC VALVE LVOT Vmax:   99.80 cm/s LVOT Vmean:  65.100 cm/s LVOT VTI:    0.256 m  AORTA Ao Root diam: 2.70 cm MITRAL VALVE MV Area (PHT): 3.27 cm    SHUNTS MV Decel Time: 232 msec    Systemic VTI:  0.26 m MV E velocity: 98.50 cm/s  Systemic Diam: 2.20 cm MV A velocity: 88.30 cm/s MV E/A ratio:  1.12 Ena Dawley MD Electronically signed by Ena Dawley MD Signature Date/Time: 02/05/2020/9:18:19 AM    Final         Scheduled Meds: . enoxaparin (LOVENOX) injection  30 mg Subcutaneous Q24H  . escitalopram  5 mg Oral Daily  . lamoTRIgine  150 mg Oral BID  . lidocaine  2 patch Transdermal Q24H  . oxybutynin  5 mg Oral BID   Continuous Infusions: . sodium chloride 100 mL/hr at 02/06/20 0332  . azithromycin 500 mg (02/05/20 2158)  . cefTRIAXone (ROCEPHIN)  IV 2 g (02/05/20 2039)     LOS: 3 days   Georgette Shell, MD 02/06/2020, 12:15 PM

## 2020-02-06 NOTE — Plan of Care (Signed)
Patient is alert and oriented. Patient has been calm, cooperative, and very responsive today. She continues to have cycles of emotional crying about her lack of ability to take care of her self. She also continues to complain of right hip pain. When transferring her she was only able to pivot on her left. Report given to nurse Garen Grams  RN and [facility] RN Problem: Health Behavior/Discharge Planning: Goal: Ability to manage health-related needs will improve Outcome: Not Progressing   Problem: Activity: Goal: Risk for activity intolerance will decrease Outcome: Not Progressing   Problem: Coping: Goal: Level of anxiety will decrease Outcome: Not Progressing

## 2020-02-06 NOTE — Evaluation (Signed)
Occupational Therapy Evaluation Patient Details Name: Kathleen Schneider MRN: [id number] DOB: [birth year] Today's Date: 02/06/2020    History of Present Illness 80 yo female admitted with Pna, Sepsis after falling at home. Hx of CKD, gait abnormality, visual deficits, spinal stenosis   Clinical Impression   Kathleen Schneider is an 79 year old woman who presents with generalized weakness, decreased activity tolerance and impaired balance resulting in a decline in functional abilities. During evaluation she has bouts of sobbing in regards to her functional decline. She reports having an aide twice a week, needing more assistance, but not financially being able to afford the aide 5 times a week. Patient required min assist for transfers, mod assist for lower body bathing, max assist for lower body dressing and toileting. Patient will benefit from skilled OT services while in hospital in order to improve deficits and learn compensatory strategies as needed in order to return to PLOF.  Recommend short term rehab at discharge due to decline. Some discussion with patient in regards to ALF as an option as she is struggling to care for herself at home.     Follow Up Recommendations  SNF    Equipment Recommendations  None recommended by OT    Recommendations for Other Services       Precautions / Restrictions Precautions Precautions: Fall Precaution Comments: monitor O2      Mobility Bed Mobility Overal bed mobility: Needs Assistance Bed Mobility: Supine to Sit     Supine to sit: [facility] elevated;Min assist     General bed mobility comments: Increased time and effort for pt. Assist to get trunk fully upright and to scoot to EOB. Pt relied heavily on bedrail.    Transfers Overall transfer level: Needs assistance Equipment used: Rolling walker (2 wheeled) Transfers: Sit to/from [facility] Sit to Stand: Min assist Stand pivot transfers: Min assist       General transfer  comment: min assist for stabilizing and tactile cues on the hips to position in front of recliner effectively. verbal cue to use hand to push up.    Balance Overall balance assessment: History of Falls;Needs assistance Sitting-balance support: No upper extremity supported;Feet supported Sitting balance-[name]: Fair     Standing balance support: During functional activity;Bilateral upper extremity supported Standing balance-[name]: Poor                             ADL either performed or assessed with clinical judgement   ADL Overall ADL's : Needs assistance/impaired Eating/Feeding: Set up;Sitting   Grooming: Set up;Sitting   Upper Body Bathing: Set up;Sitting   Lower Body Bathing: Moderate assistance;Set up;Sit to/from stand   Upper Body Dressing : Set up;Sitting   Lower Body Dressing: Maximal assistance;Set up;Sit to/from stand   Toilet Transfer: Minimal assistance;Stand-pivot;BSC   Toileting- Clothing Manipulation and Hygiene: Maximal assistance;Sit to/from stand               Vision Patient Visual Report: No change from baseline       Perception     Praxis      Pertinent Vitals/Pain Pain Assessment: No/denies pain     Hand Dominance Right   Extremity/Trunk Assessment Upper Extremity Assessment Upper Extremity Assessment: Overall WFL for tasks assessed (Overall strength 4/5. Arms exhibiting myoclonus/twitching with active movement)   Lower Extremity Assessment Lower Extremity Assessment: Defer to PT evaluation   Cervical / Trunk Assessment Cervical / Trunk Assessment: Normal  Communication Communication Communication: No difficulties   Cognition Arousal/Alertness: Awake/alert Behavior During Therapy: WFL for tasks assessed/performed Overall Cognitive Status: Within Functional Limits for tasks assessed                                 General Comments: Patient sobbing at times during evaluation in regards to age  and functional decline.   General Comments       Exercises     Shoulder Instructions      Home Living Family/patient expects to be discharged to:: Private residence Living Arrangements: Alone Available Help at Discharge: Family;Available PRN/intermittently Type of Home: House Home Access: Stairs to enter Entrance Stairs-Number of Steps: 3 Entrance Stairs-Rails:  (holds on to side of building and/or door frame) Home Layout: Two level;Able to live on main level with bedroom/bathroom               Home Equipment: Gilford Rile - 2 wheels;Cane - single point          Prior Functioning/Environment Level of Independence: Independent with assistive device(s)        Comments: uses RW for ambulation. Living on main floor - living room is now the bedroom. Has walk in shower downstairs. CNA comes twice a week ( wed & fri) - assists with preparing meals, laundry, bathing.        OT Problem List: Decreased strength;Decreased activity tolerance;Impaired balance (sitting and/or standing);Decreased knowledge of use of DME or AE;Obesity      OT Treatment/Interventions: Self-care/ADL training;Therapeutic exercise;DME and/or AE instruction;Patient/family education;Balance training;Therapeutic activities    OT Goals(Current goals can be found in the care plan section) Acute Rehab OT Goals Patient Stated Goal: to regain independence and PLOF OT Goal Formulation: With patient Time For Goal Achievement: 02/20/20 Potential to Achieve Goals: Good  OT Frequency: Min 2X/week   Barriers to D/C: Decreased caregiver support  Reports having an aide 2 x a week from 4-9. Reports needing an aide 5 x a week.       Co-evaluation              AM-PAC OT "6 Clicks" Daily Activity     Outcome Measure Help from another person eating meals?: None Help from another person taking care of personal grooming?: A Little Help from another person toileting, which includes using toliet, bedpan, or urinal?:  A Lot Help from another person bathing (including washing, rinsing, drying)?: A Lot Help from another person to put on and taking off regular upper body clothing?: A Little Help from another person to put on and taking off regular lower body clothing?: A Lot 6 Click Score: 16   End of Session Equipment Utilized During Treatment: Rolling walker;Oxygen Nurse Communication: Mobility status (crying, potential need for chaplain for emotional support)  Activity Tolerance: Patient tolerated treatment well Patient left: in chair;with call bell/phone within reach;with chair alarm set  OT Visit Diagnosis: Unsteadiness on feet (R26.81);Muscle weakness (generalized) (M62.81);History of falling (Z91.81)                Time: 5784-6962 OT Time Calculation (min): 38 min Charges:  OT General Charges $OT Visit: 1 Visit OT Evaluation $OT Eval Moderate Complexity: 1 Mod OT Treatments $Self Care/Home Management : 23-37 mins  Noland Pizano, OTR/L [place] [phone] Pager: [place] 02/06/2020, 12:47 PM

## 2020-02-06 NOTE — Progress Notes (Signed)
PHARMACY - PHYSICIAN COMMUNICATION CRITICAL VALUE ALERT - BLOOD CULTURE IDENTIFICATION (BCID)  Kathleen Schneider is an 79 y.o. female who presented to [facility] on 02/03/2020 with a chief complaint of SOB and weakness   Name of physician (or Provider) Contacted: X. Blount  Current antibiotics: azith and ceftriaxone  Changes to prescribed antibiotics recommended:  none  Results for orders placed or performed during the hospital encounter of 02/03/20  Blood Culture ID Panel (Reflexed) (Collected: 02/04/2020  6:45 AM)  Result Value Ref Range   Enterococcus faecalis NOT DETECTED NOT DETECTED   Enterococcus Faecium NOT DETECTED NOT DETECTED   Listeria monocytogenes NOT DETECTED NOT DETECTED   Staphylococcus species DETECTED (A) NOT DETECTED   Staphylococcus aureus (BCID) NOT DETECTED NOT DETECTED   Staphylococcus epidermidis DETECTED (A) NOT DETECTED   Staphylococcus lugdunensis NOT DETECTED NOT DETECTED   Streptococcus species NOT DETECTED NOT DETECTED   Streptococcus agalactiae NOT DETECTED NOT DETECTED   Streptococcus pneumoniae NOT DETECTED NOT DETECTED   Streptococcus pyogenes NOT DETECTED NOT DETECTED   A.calcoaceticus-baumannii NOT DETECTED NOT DETECTED   Bacteroides fragilis NOT DETECTED NOT DETECTED   Enterobacterales NOT DETECTED NOT DETECTED   Enterobacter cloacae complex NOT DETECTED NOT DETECTED   Escherichia coli NOT DETECTED NOT DETECTED   Klebsiella aerogenes NOT DETECTED NOT DETECTED   Klebsiella oxytoca NOT DETECTED NOT DETECTED   Klebsiella pneumoniae NOT DETECTED NOT DETECTED   Proteus species NOT DETECTED NOT DETECTED   Salmonella species NOT DETECTED NOT DETECTED   Serratia marcescens NOT DETECTED NOT DETECTED   Haemophilus influenzae NOT DETECTED NOT DETECTED   Neisseria meningitidis NOT DETECTED NOT DETECTED   Pseudomonas aeruginosa NOT DETECTED NOT DETECTED   Stenotrophomonas maltophilia NOT DETECTED NOT DETECTED   Candida albicans NOT DETECTED NOT  DETECTED   Candida auris NOT DETECTED NOT DETECTED   Candida glabrata NOT DETECTED NOT DETECTED   Candida krusei NOT DETECTED NOT DETECTED   Candida parapsilosis NOT DETECTED NOT DETECTED   Candida tropicalis NOT DETECTED NOT DETECTED   Cryptococcus neoformans/gattii NOT DETECTED NOT DETECTED   Methicillin resistance mecA/C DETECTED (A) NOT DETECTED    Angela Adam 02/06/2020  2:31 AM

## 2020-02-07 ENCOUNTER — Inpatient Hospital Stay (HOSPITAL_COMMUNITY): Payer: PPO

## 2020-02-07 DIAGNOSIS — J9601 Acute respiratory failure with hypoxia: Secondary | ICD-10-CM | POA: Diagnosis not present

## 2020-02-07 LAB — COMPREHENSIVE METABOLIC PANEL
ALT: 19 U/L (ref 0–44)
AST: 18 U/L (ref 15–41)
Albumin: 2.1 g/dL — ABNORMAL LOW (ref 3.5–5.0)
Alkaline Phosphatase: 105 U/L (ref 38–126)
Anion gap: 7 (ref 5–15)
BUN: 26 mg/dL — ABNORMAL HIGH (ref 8–23)
CO2: 24 mmol/L (ref 22–32)
Calcium: 8.1 mg/dL — ABNORMAL LOW (ref 8.9–10.3)
Chloride: 105 mmol/L (ref 98–111)
Creatinine, Ser: 1.6 mg/dL — ABNORMAL HIGH (ref 0.44–1.00)
GFR, Estimated: 32 mL/min — ABNORMAL LOW (ref >60)
Glucose, Bld: 103 mg/dL — ABNORMAL HIGH (ref 70–99)
Potassium: 3.8 mmol/L (ref 3.5–5.1)
Sodium: 136 mmol/L (ref 135–145)
Total Bilirubin: 0.3 mg/dL (ref 0.3–1.2)
Total Protein: 5.5 g/dL — ABNORMAL LOW (ref 6.5–8.1)

## 2020-02-07 LAB — CBC
HCT: 28.3 % — ABNORMAL LOW (ref 36.0–46.0)
Hemoglobin: 8.9 g/dL — ABNORMAL LOW (ref 12.0–15.0)
MCH: 29 pg (ref 26.0–34.0)
MCHC: 31.4 g/dL (ref 30.0–36.0)
MCV: 92.2 fL (ref 80.0–100.0)
Platelets: 171 10*3/uL (ref 150–400)
RBC: 3.07 MIL/uL — ABNORMAL LOW (ref 3.87–5.11)
RDW: 17.5 % — ABNORMAL HIGH (ref 11.5–15.5)
WBC: 11.5 10*3/uL — ABNORMAL HIGH (ref 4.0–10.5)
nRBC: 0 % (ref 0.0–0.2)

## 2020-02-07 MED ORDER — FUROSEMIDE 10 MG/ML IJ SOLN: 40.0000 mg | Freq: Once | INTRAMUSCULAR | Status: AC

## 2020-02-07 MED ORDER — POTASSIUM CHLORIDE CRYS ER 20 MEQ PO TBCR
40.0000 meq | EXTENDED_RELEASE_TABLET | Freq: Once | ORAL | Status: AC
  Administered 2020-02-07: 40 meq via ORAL
  Filled 2020-02-07: qty 2

## 2020-02-07 MED ORDER — POTASSIUM CHLORIDE CRYS ER 20 MEQ PO TBCR
40.0000 meq | EXTENDED_RELEASE_TABLET | Freq: Two times a day (BID) | ORAL | Status: AC
  Administered 2020-02-07 (×2): 40 meq via ORAL
  Filled 2020-02-07 (×2): qty 2

## 2020-02-07 MED ORDER — SODIUM CHLORIDE 0.9 % IV SOLN: INTRAVENOUS | Status: DC | PRN

## 2020-02-07 MED ORDER — FUROSEMIDE 10 MG/ML IJ SOLN: 20.0000 mg | Freq: Once | INTRAMUSCULAR | Status: AC

## 2020-02-07 MED ORDER — FUROSEMIDE 10 MG/ML IJ SOLN
INTRAMUSCULAR | Status: AC
  Administered 2020-02-07: 40 mg via INTRAVENOUS
  Filled 2020-02-07: qty 4

## 2020-02-07 MED ORDER — FUROSEMIDE 10 MG/ML IJ SOLN
40.0000 mg | Freq: Four times a day (QID) | INTRAMUSCULAR | Status: AC
  Administered 2020-02-07 – 2020-02-08 (×4): 40 mg via INTRAVENOUS
  Filled 2020-02-07 (×4): qty 4

## 2020-02-07 MED ORDER — FUROSEMIDE 10 MG/ML IJ SOLN
INTRAMUSCULAR | Status: AC
  Administered 2020-02-07: 20 mg via INTRAVENOUS
  Filled 2020-02-07: qty 2

## 2020-02-07 NOTE — Progress Notes (Signed)
Dulce.Johann- Patient saturating at 89% on 2L of [place] upon entry to room. This RN increased oxygen to 5L and assessed lung sounds for wheezing and rhonchi. 0810-DrRodena Piety paged and came to bedside. Vital signs obtained. Dr. Rodena Piety ordered lasix, chest xray and breathing treatment. Will continue to monitor.

## 2020-02-07 NOTE — Progress Notes (Signed)
PROGRESS NOTE    Kathleen Schneider  OBS:[id number] DOB: Jan 15, 1941 DOA: 02/03/2020 PCP: Donald Prose, MD   Brief Narrative:79 y.o. female with medical history significant of hypertension, recurrent UTI, chronic kidney disease stage III, excessive daytime sleepiness, gait abnormalities, visual changes, who came in from home with shortness of breath and weakness.  Patient noticed progressive shortness of breath over the last 3 to 4 days.  Associated with some cough.  She felt very weak.  No fever or chills.  She has been weak that she has been mostly in bed.  She came to the ER where she was evaluated.  Patient was noted to be mildly hypoxic.  She also requires about 2 L of oxygen now.  She is COVID-19 negative but has evidence of pneumonia versus CHF.  Her BNP is elevated but also has leukocytosis and infiltrates that could be technically speaking and pneumonia.  She is got elevated creatinine which is slightly above her baseline.  She is therefore being admitted to the hospital for acute respiratory failure probably pneumonia but could be CHF as well..  ED Course: Temperature 98.1 blood pressure 130/59 pulse 108 respiratory rate of 24 oxygen sats 93% on 2 L.  White count is 18.2, hemoglobin 11.3, platelets 238 sodium 132 potassium 3.2 chloride 96 CO2 22 BUN 37 creatinine 2.05 and calcium 8.5.  Glucose 139.  COVID-19 screen is negative.  Urinalysis showed many bacteria with WBC 21-50 but negative leukocytes and nitrite.  Probably asymptomatic bacteriuria.  Chest x-ray showed left basilar opacity consistent with atelectasis versus infiltrate.  BNP is 382.  Patient being admitted with pneumonia as well as suspected cardiac cause.   Assessment & Plan:   Principal Problem:   Acute respiratory failure with hypoxia (HCC) Active Problems:   Spinal stenosis, lumbar   ARF (acute renal failure) (HCC)   Hyponatremia   Leucocytosis   Hypokalemia   Elevated brain natriuretic peptide (BNP) level   Sepsis  ([place])  #1  Sepsis present on admission as evidenced by lactic acidosis secondary to community-acquired pneumonia-  she was tachypneic tachycardic and febrile at 103.1 with lactic acidosis and leukocytosis.  Lactic acid level was 2.2 down to 1.1 Chest x-ray with left basilar opacity consistent with atelectasis or infiltrates. Continue Rocephin and azithromycin. Follow-up blood cultures-staph epidermidis. Her blood pressure is improved 129/56.  DC IV fluids.   Patient remains intermittently tachycardic with heart rate 110 and tachypneic Influenza negative Covid negative. Leukocytosis resolved. MRSA PCR negative. Patient became hypoxic early this morning requiring 6 L of oxygen and became more tachypneic and hypoxic.  A stat chest x-ray was ordered that showed small bibasilar pleural effusion she was given Lasix 60 mg IV with nebulizer treatments.  We will keep her on Lasix 40 mg IVevery 6x4 doses.  DC IV fluids.  #2 acute hypoxic respiratory failure secondary to CAP continue Rocephin azithromycin.  Patient was not on oxygen prior to admission to hospital.  She became hypoxic requiring 6 L of oxygen to maintain her sats above 90%.  #2 mild hyponatremia -sodium 137. Resolved.   #3 AKI on CKD stage IIIb -creatinine 1.60 DC IV fluids.  #4 elevated BNP?  This is likely related to AKI or obesity.  Echo 02/05/2020 with ejection fraction 65 to 70% with normal LV function no regional wall motion abnormalities.  Grade 1 diastolic dysfunction.  #5  Mildly elevated troponin 61 likely due to demand ischemia from #1 and CKD  #6 deconditioning patient lives alone seen by  physical therapy recommending SNF Case manager aware.   Estimated body mass index is 36.25 kg/m as calculated from the following:   Height as of this encounter: 5' (1.524 m).   Weight as of this encounter: 84.2 kg.  DVT prophylaxis: Lovenox  code Status: Full code  family Communication: None at bedside  disposition Plan:  Status  is: Inpatient  Dispo: The patient is from: Home              Anticipated d/c is to: Skilled nursing facility              Anticipated d/c date is: 2 days              Patient currently is not medically stable to d/c.    Consultants:   None  Procedures: None Antimicrobials: Rocephin and azithromycin  Subjective: She is resting in bed on 6 L of oxygen appears to be in respiratory distress using accessory muscles and pursed lips and tachypneic. Objective: Vitals:   02/07/20 0800 02/07/20 0833 02/07/20 0933 02/07/20 1000  BP:  133/76 (!) 129/56   Pulse:  89 87   Resp:  16 17   Temp:  98.2 F (36.8 C) 98.9 F (37.2 C)   TempSrc:  Oral Oral   SpO2: (!) 88% 98% 99% 90%  Weight:      Height:        Intake/Output Summary (Last 24 hours) at 02/07/2020 1233 Last data filed at 02/07/2020 0934 Gross per 24 hour  Intake 2562.17 ml  Output 2225 ml  Net 337.17 ml   Filed Weights   02/04/20 1300  Weight: 84.2 kg    Examination:  General exam: Appears calm and comfortable  Respiratory system: Coarse breath sounds bases to auscultation.  Diminished breath sounds at the bases respiratory effort normal. Cardiovascular system: S1 & S2 heard, RRR. No JVD, murmurs, rubs, gallops or clicks. No pedal edema. Gastrointestinal system: Abdomen is nondistended, soft and nontender. No organomegaly or masses felt. Normal bowel sounds heard. Central nervous system: Alert and oriented. No focal neurological deficits. Extremities: 1+ pitting edema  skin: No rashes, lesions or ulcers Psychiatry: Judgement and insight appear normal. Mood & affect appropriate.     Data Reviewed: I have personally reviewed following labs and imaging studies  CBC: Recent Labs  Lab 02/03/20 1820 02/03/20 1820 02/04/20 0625 02/04/20 2035 02/05/20 0417 02/06/20 0408 02/07/20 0321  WBC 18.2*   < > 11.9* 11.4* 27.6* 15.4* 11.5*  NEUTROABS 16.4*  --  11.2* 10.4*  --   --   --   HGB 11.3*   < > 9.0* 10.5* 8.4*  8.5* 8.9*  HCT 35.8*   < > 28.0* 33.2* 26.2* 27.2* 28.3*  MCV 90.4   < > 89.2 91.0 90.0 91.3 92.2  PLT 238   < > 187 189 167 152 171   < > = values in this interval not displayed.   Basic Metabolic Panel: Recent Labs  Lab 02/04/20 0625 02/04/20 2035 02/05/20 0417 02/06/20 0408 02/07/20 0321  NA 133* 133* 132* 137 136  K 4.0 3.5 3.5 3.0* 3.8  CL 99 100 101 105 105  CO2 22 19* 22 22 24   GLUCOSE 168* 106* 190* 146* 103*  BUN 39* 40* 41* 37* 26*  CREATININE 1.75* 1.70* 1.89* 1.54* 1.60*  CALCIUM 8.2* 8.5* 8.0* 8.4* 8.1*   GFR: Estimated Creatinine Clearance: 27 mL/min (A) (by C-G formula based on SCr of 1.6 mg/dL (H)). Liver Function Tests: Recent  Labs  Lab 02/04/20 0625 02/04/20 2035 02/05/20 0417 02/06/20 0408 02/07/20 0321  AST 17 20 17 21 18   ALT 16 18 16 20 19   ALKPHOS 135* 261* 134* 120 105  BILITOT 0.8 0.9 0.6 0.5 0.3  PROT 6.3* 6.8 5.7* 5.6* 5.5*  ALBUMIN 2.5* 2.8* 2.2* 2.2* 2.1*   No results for input(s): LIPASE, AMYLASE in the last 168 hours. No results for input(s): AMMONIA in the last 168 hours. Coagulation Profile: No results for input(s): INR, PROTIME in the last 168 hours. Cardiac Enzymes: Recent Labs  Lab 02/03/20 1820  CKTOTAL 60   BNP (last 3 results) No results for input(s): PROBNP in the last 8760 hours. HbA1C: No results for input(s): HGBA1C in the last 72 hours. CBG: No results for input(s): GLUCAP in the last 168 hours. Lipid Profile: No results for input(s): CHOL, HDL, LDLCALC, TRIG, CHOLHDL, LDLDIRECT in the last 72 hours. Thyroid Function Tests: No results for input(s): TSH, T4TOTAL, FREET4, T3FREE, THYROIDAB in the last 72 hours. Anemia Panel: No results for input(s): VITAMINB12, FOLATE, FERRITIN, TIBC, IRON, RETICCTPCT in the last 72 hours. Sepsis Labs: Recent Labs  Lab 02/04/20 1533 02/04/20 1749 02/04/20 2035 02/04/20 2327  LATICACIDVEN 1.4 1.3 2.2* 1.1    Recent Results (from the past 240 hour(s))  Resp Panel by RT  PCR (RSV, Flu A&B, Covid) - Nasopharyngeal Swab     Status: None   Collection Time: 02/03/20  6:25 PM   Specimen: Nasopharyngeal Swab  Result Value Ref Range Status   SARS Coronavirus 2 by RT PCR NEGATIVE NEGATIVE Final    Comment: (NOTE) SARS-CoV-2 target nucleic acids are NOT DETECTED.  The SARS-CoV-2 RNA is generally detectable in upper respiratoy specimens during the acute phase of infection. The lowest concentration of SARS-CoV-2 viral copies this assay can detect is 131 copies/mL. A negative result does not preclude SARS-Cov-2 infection and should not be used as the sole basis for treatment or other patient management decisions. A negative result may occur with  improper specimen collection/handling, submission of specimen other than nasopharyngeal swab, presence of viral mutation(s) within the areas targeted by this assay, and inadequate number of viral copies (<131 copies/mL). A negative result must be combined with clinical observations, patient history, and epidemiological information. The expected result is Negative.  Fact Sheet for Patients:  PinkCheek.be  Fact Sheet for Healthcare Providers:  GravelBags.it  This test is no t yet approved or cleared by the [place] FDA and  has been authorized for detection and/or diagnosis of SARS-CoV-2 by FDA under an Emergency Use Authorization (EUA). This EUA will remain  in effect (meaning this test can be used) for the duration of the COVID-19 declaration under Section 564(b)(1) of the Act, 21 U.S.C. section 360bbb-3(b)(1), unless the authorization is terminated or revoked sooner.     Influenza A by PCR NEGATIVE NEGATIVE Final   Influenza B by PCR NEGATIVE NEGATIVE Final    Comment: (NOTE) The Xpert Xpress SARS-CoV-2/FLU/RSV assay is intended as an aid in  the diagnosis of influenza from Nasopharyngeal swab specimens and  should not be used as a sole basis for  treatment. Nasal washings and  aspirates are unacceptable for Xpert Xpress SARS-CoV-2/FLU/RSV  testing.  Fact Sheet for Patients: PinkCheek.be  Fact Sheet for Healthcare Providers: GravelBags.it  This test is not yet approved or cleared by the [place] FDA and  has been authorized for detection and/or diagnosis of SARS-CoV-2 by  FDA under an Emergency Use Authorization (EUA). This EUA  will remain  in effect (meaning this test can be used) for the duration of the  Covid-19 declaration under Section 564(b)(1) of the Act, 21  U.S.C. section 360bbb-3(b)(1), unless the authorization is  terminated or revoked.    Respiratory Syncytial Virus by PCR NEGATIVE NEGATIVE Final    Comment: (NOTE) Fact Sheet for Patients: PinkCheek.be  Fact Sheet for Healthcare Providers: GravelBags.it  This test is not yet approved or cleared by the [place] FDA and  has been authorized for detection and/or diagnosis of SARS-CoV-2 by  FDA under an Emergency Use Authorization (EUA). This EUA will remain  in effect (meaning this test can be used) for the duration of the  COVID-19 declaration under Section 564(b)(1) of the Act, 21 U.S.C.  section 360bbb-3(b)(1), unless the authorization is terminated or  revoked. Performed at [facility], [place] [address] [place], [place] [address]   Culture, blood (routine x 2) Call MD if unable to obtain prior to antibiotics being given     Status: Abnormal   Collection Time: 02/04/20  6:45 AM   Specimen: BLOOD  Result Value Ref Range Status   Specimen Description   Final    BLOOD LEFT ANTECUBITAL Performed at [place] [address], [place], [place] [address]    Special Requests   Final    BOTTLES DRAWN AEROBIC AND ANAEROBIC Blood Culture adequate volume Performed at [facility] [place] [address] [place], [place] [address]    Culture  Setup Time   Final    AEROBIC BOTTLE ONLY GRAM POSITIVE COCCI Organism ID to follow CRITICAL RESULT CALLED TO, READ BACK BY AND VERIFIED WITH: E JACKSON PHARMD 02/06/20 0210 JDW    Culture (A)  Final    STAPHYLOCOCCUS EPIDERMIDIS THE SIGNIFICANCE OF ISOLATING THIS ORGANISM FROM A SINGLE SET OF BLOOD CULTURES WHEN MULTIPLE SETS ARE DRAWN IS UNCERTAIN. PLEASE NOTIFY THE MICROBIOLOGY DEPARTMENT WITHIN ONE WEEK IF SPECIATION AND SENSITIVITIES ARE REQUIRED. Performed at [facility], [place] [address], [place], [place] [address]    Report Status 02/06/2020 FINAL  Final  Blood Culture ID Panel (Reflexed)     Status: Abnormal   Collection Time: 02/04/20  6:45 AM  Result Value Ref Range Status   Enterococcus faecalis NOT DETECTED NOT DETECTED Final   Enterococcus Faecium NOT DETECTED NOT DETECTED Final   Listeria monocytogenes NOT DETECTED NOT DETECTED Final   Staphylococcus species DETECTED (A) NOT DETECTED Final    Comment: CRITICAL RESULT CALLED TO, READ BACK BY AND VERIFIED WITH: E JACKSON PHARMD 02/06/20 0210 JDW    Staphylococcus aureus (BCID) NOT DETECTED NOT DETECTED Final   Staphylococcus epidermidis DETECTED (A) NOT DETECTED Final    Comment: Methicillin (oxacillin) resistant coagulase negative staphylococcus. Possible blood culture contaminant (unless isolated from more than one blood culture draw or clinical case suggests pathogenicity). No antibiotic treatment is indicated for blood  culture contaminants. CRITICAL RESULT CALLED TO, READ BACK BY AND VERIFIED WITH: E JACKSON PHARMD 02/06/20 0210 JDW    Staphylococcus lugdunensis NOT DETECTED NOT DETECTED Final   Streptococcus species NOT DETECTED NOT DETECTED Final   Streptococcus agalactiae NOT DETECTED NOT DETECTED Final   Streptococcus pneumoniae NOT DETECTED NOT DETECTED Final   Streptococcus pyogenes NOT DETECTED NOT DETECTED Final   A.calcoaceticus-baumannii NOT  DETECTED NOT DETECTED Final   Bacteroides fragilis NOT DETECTED NOT DETECTED Final   Enterobacterales NOT DETECTED NOT DETECTED Final   Enterobacter cloacae complex NOT DETECTED NOT DETECTED Final  Escherichia coli NOT DETECTED NOT DETECTED Final   Klebsiella aerogenes NOT DETECTED NOT DETECTED Final   Klebsiella oxytoca NOT DETECTED NOT DETECTED Final   Klebsiella pneumoniae NOT DETECTED NOT DETECTED Final   Proteus species NOT DETECTED NOT DETECTED Final   Salmonella species NOT DETECTED NOT DETECTED Final   Serratia marcescens NOT DETECTED NOT DETECTED Final   Haemophilus influenzae NOT DETECTED NOT DETECTED Final   Neisseria meningitidis NOT DETECTED NOT DETECTED Final   Pseudomonas aeruginosa NOT DETECTED NOT DETECTED Final   Stenotrophomonas maltophilia NOT DETECTED NOT DETECTED Final   Candida albicans NOT DETECTED NOT DETECTED Final   Candida auris NOT DETECTED NOT DETECTED Final   Candida glabrata NOT DETECTED NOT DETECTED Final   Candida krusei NOT DETECTED NOT DETECTED Final   Candida parapsilosis NOT DETECTED NOT DETECTED Final   Candida tropicalis NOT DETECTED NOT DETECTED Final   Cryptococcus neoformans/gattii NOT DETECTED NOT DETECTED Final   Methicillin resistance mecA/C DETECTED (A) NOT DETECTED Final    Comment: CRITICAL RESULT CALLED TO, READ BACK BY AND VERIFIED WITHSeleta Rhymes [facility] 02/06/20 0210 JDW Performed at [facility], [address], [place], [place] [address]   Culture, blood (routine x 2) Call MD if unable to obtain prior to antibiotics being given     Status: None (Preliminary result)   Collection Time: 02/04/20  7:10 AM   Specimen: BLOOD RIGHT HAND  Result Value Ref Range Status   Specimen Description   Final    BLOOD RIGHT HAND Performed at [facility] [address], [place], [place] [address]    Special Requests   Final    BOTTLES DRAWN AEROBIC AND ANAEROBIC Blood Culture adequate volume Performed at [place] [address], [place], [place] [address]    Culture   Final    NO GROWTH 3 DAYS Performed at [facility], [place] [address], [place], [place] [address]    Report Status PENDING  Incomplete  MRSA PCR Screening     Status: None   Collection Time: 02/05/20  1:37 PM   Specimen: Nasal Mucosa; Nasopharyngeal  Result Value Ref Range Status   MRSA by PCR NEGATIVE NEGATIVE Final    Comment:        The GeneXpert MRSA Assay (FDA approved for NASAL specimens only), is one component of a comprehensive MRSA colonization surveillance program. It is not intended to diagnose MRSA infection nor to guide or monitor treatment for MRSA infections. Performed at [facility], [place] [address] [place],  [address]          Radiology Studies: DG CHEST PORT 1 VIEW  Result Date: 02/07/2020 CLINICAL DATA:  New onset shortness of breath EXAM: PORTABLE CHEST 1 VIEW COMPARISON:  Three days ago FINDINGS: Large lung volumes with diaphragm flattening. Trace pleural effusions and generalized reticulation that is stable. Normal heart size for technique. No pneumothorax. IMPRESSION: 1. Trace pleural effusions with mild scarring or atelectasis at the left base. 2. Stable interstitial prominence, favored bronchitic Electronically Signed   By: Monte Fantasia M.D.   On: 02/07/2020 08:46        Scheduled Meds: . azithromycin  500 mg Oral QHS  . enoxaparin (LOVENOX) injection  30 mg Subcutaneous Q24H  . escitalopram  5 mg Oral Daily  . lamoTRIgine  150 mg Oral BID  . lidocaine  2 patch Transdermal Q24H  . oxybutynin  5 mg Oral BID   Continuous Infusions: .  cefTRIAXone (ROCEPHIN)  IV 2 g (02/06/20 2013)     LOS: 4 days   Georgette Shell, MD 02/07/2020, 12:33 PM

## 2020-02-07 NOTE — Plan of Care (Signed)
Alert and oriented. Tearful and anxious, requires reinforcement. On O2 at 2L, telemetry on. On continuous pulse ox, has intermittent cough. Has purewick in placed. Call bell within reach.  Problem: Education: Goal: Knowledge of General Education information will improve Description: Including pain rating scale, medication(s)/side effects and non-pharmacologic comfort measures Outcome: Progressing   Problem: Health Behavior/Discharge Planning: Goal: Ability to manage health-related needs will improve Outcome: Progressing   Problem: Clinical Measurements: Goal: Ability to maintain clinical measurements within normal limits will improve Outcome: Progressing Goal: Will remain free from infection Outcome: Progressing Goal: Diagnostic test results will improve Outcome: Progressing Goal: Respiratory complications will improve Outcome: Progressing Goal: Cardiovascular complication will be avoided Outcome: Progressing   Problem: Activity: Goal: Risk for activity intolerance will decrease Outcome: Progressing   Problem: Nutrition: Goal: Adequate nutrition will be maintained Outcome: Progressing   Problem: Coping: Goal: Level of anxiety will decrease Outcome: Progressing   Problem: Elimination: Goal: Will not experience complications related to bowel motility Outcome: Progressing Goal: Will not experience complications related to urinary retention Outcome: Progressing   Problem: Pain Managment: Goal: General experience of comfort will improve Outcome: Progressing   Problem: Safety: Goal: Ability to remain free from injury will improve Outcome: Progressing   Problem: Skin Integrity: Goal: Risk for impaired skin integrity will decrease Outcome: Progressing

## 2020-02-08 DIAGNOSIS — J9601 Acute respiratory failure with hypoxia: Secondary | ICD-10-CM | POA: Diagnosis not present

## 2020-02-08 LAB — COMPREHENSIVE METABOLIC PANEL
ALT: 17 U/L (ref 0–44)
AST: 14 U/L — ABNORMAL LOW (ref 15–41)
Albumin: 2.5 g/dL — ABNORMAL LOW (ref 3.5–5.0)
Alkaline Phosphatase: 111 U/L (ref 38–126)
Anion gap: 10 (ref 5–15)
BUN: 23 mg/dL (ref 8–23)
CO2: 28 mmol/L (ref 22–32)
Calcium: 8.6 mg/dL — ABNORMAL LOW (ref 8.9–10.3)
Chloride: 97 mmol/L — ABNORMAL LOW (ref 98–111)
Creatinine, Ser: 1.62 mg/dL — ABNORMAL HIGH (ref 0.44–1.00)
GFR, Estimated: 32 mL/min — ABNORMAL LOW (ref >60)
Glucose, Bld: 89 mg/dL (ref 70–99)
Potassium: 4.4 mmol/L (ref 3.5–5.1)
Sodium: 135 mmol/L (ref 135–145)
Total Bilirubin: 0.5 mg/dL (ref 0.3–1.2)
Total Protein: 6.3 g/dL — ABNORMAL LOW (ref 6.5–8.1)

## 2020-02-08 LAB — CBC
HCT: 30.8 % — ABNORMAL LOW (ref 36.0–46.0)
Hemoglobin: 9.7 g/dL — ABNORMAL LOW (ref 12.0–15.0)
MCH: 28.4 pg (ref 26.0–34.0)
MCHC: 31.5 g/dL (ref 30.0–36.0)
MCV: 90.1 fL (ref 80.0–100.0)
Platelets: 204 10*3/uL (ref 150–400)
RBC: 3.42 MIL/uL — ABNORMAL LOW (ref 3.87–5.11)
RDW: 17.2 % — ABNORMAL HIGH (ref 11.5–15.5)
WBC: 10.6 10*3/uL — ABNORMAL HIGH (ref 4.0–10.5)
nRBC: 0 % (ref 0.0–0.2)

## 2020-02-08 LAB — SARS CORONAVIRUS 2 BY RT PCR (HOSPITAL ORDER, PERFORMED IN ~~LOC~~ HOSPITAL LAB): SARS Coronavirus 2: NEGATIVE

## 2020-02-08 NOTE — NC FL2 (Signed)
[place] LEVEL OF CARE SCREENING TOOL     IDENTIFICATION  Patient Name: Kathleen Schneider Birthdate: [birth year] Sex: female Admission Date (Current Location): 02/03/2020  [facility] and [place] Number:  Herbalist and Address:  [facility],  [place], [place]      Provider Number: [id number]  Attending Physician Name and Address:  Georgette Shell, MD  Relative Name and Phone Number:       Current Level of Care: Hospital Recommended Level of Care: [place] Prior Approval Number:    Date Approved/Denied:   PASRR Number: [id number]  Discharge Plan: SNF    Current Diagnoses: Patient Active Problem List   Diagnosis Date Noted  . Hyponatremia 02/03/2020  . Leucocytosis 02/03/2020  . Hypokalemia 02/03/2020  . Acute respiratory failure with hypoxia ([place]) 02/03/2020  . Elevated brain natriuretic peptide (BNP) level 02/03/2020  . Sepsis ([place]) 02/03/2020  . ARF (acute renal failure) ([place]) 12/09/2019  . Fall at home, initial encounter 12/09/2019  . Acute lower UTI 12/09/2019  . Rhabdomyolysis 12/09/2019  . Dehydration 12/09/2019  . Advanced age 56/25/2021  . Bilateral knee pain 12/09/2019  . Facet joint syndrome 05/13/2017  . Spinal stenosis, lumbar 09/25/2016  . Lower back pain 03/27/2016  . Excessive daytime sleepiness 05/24/2015  . Postherpetic neuralgia 02/18/2015  . Dysesthesia 02/18/2015  . Insomnia 02/18/2015  . Snoring 02/18/2015    Orientation RESPIRATION BLADDER Height & Weight     Self, Time, Situation, Place  O2 (2L [place]) External catheter Weight: 84.2 kg Height:  5' (152.4 cm)  BEHAVIORAL SYMPTOMS/MOOD NEUROLOGICAL BOWEL NUTRITION STATUS   (none)  (none) Continent Diet (see d/c summary)  AMBULATORY STATUS COMMUNICATION OF NEEDS Skin   Extensive Assist Verbally Normal                       Personal Care Assistance Level of Assistance  Bathing, Feeding, Dressing Bathing  Assistance: Maximum assistance Feeding assistance: Independent Dressing Assistance: Limited assistance     Functional Limitations Info  Sight, Hearing, Speech Sight Info: Adequate Hearing Info: Adequate Speech Info: Adequate    SPECIAL CARE FACTORS FREQUENCY  PT (By licensed PT), OT (By licensed OT)     PT Frequency: 5X/W OT Frequency: 5X/W            Contractures Contractures Info: Not present    Additional Factors Info  Code Status, Allergies Code Status Info: Full Allergies Info: NKA           Current Medications (02/08/2020):  This is the current hospital active medication list Current Facility-Administered Medications  Medication Dose Route Frequency Provider Last Rate Last Admin  . 0.9 %  sodium chloride infusion   Intravenous PRN Georgette Shell, MD      . acetaminophen (TYLENOL) tablet 500 mg  500 mg Oral Q6H PRN Georgette Shell, MD   500 mg at 02/06/20 1003  . cefTRIAXone (ROCEPHIN) 2 g in sodium chloride 0.9 % 100 mL IVPB  2 g Intravenous Q24H Gala Romney L, MD 200 mL/hr at 02/07/20 2101 2 g at 02/07/20 2101  . enoxaparin (LOVENOX) injection 30 mg  30 mg Subcutaneous Q24H Georgette Shell, MD   30 mg at 02/08/20 2440  . escitalopram (LEXAPRO) tablet 5 mg  5 mg Oral Daily Georgette Shell, MD   5 mg at 02/08/20 0851  . ipratropium-albuterol (DUONEB) 0.5-2.5 (3) MG/3ML nebulizer solution 3 mL  3 mL Nebulization  Q6H PRN Georgette Shell, MD   3 mL at 02/07/20 0820  . lamoTRIgine (LAMICTAL) tablet 150 mg  150 mg Oral BID Georgette Shell, MD   150 mg at 02/08/20 0849  . lidocaine (LIDODERM) 5 % 2 patch  2 patch Transdermal Q24H Georgette Shell, MD   2 patch at 02/07/20 1338  . oxybutynin (DITROPAN) tablet 5 mg  5 mg Oral BID Georgette Shell, MD   5 mg at 02/08/20 9432     Discharge Medications: Please see discharge summary for a list of discharge medications.  Relevant Imaging Results:  Relevant Lab  Results:   Additional Information [address], [place]

## 2020-02-08 NOTE — Care Management Important Message (Signed)
Important Message  Patient Details IM Letter placed in door cubby to be presented to the Patient Name: Kathleen Schneider MRN: [id number] Date of Birth: February 07, 1941   Medicare Important Message Given:  Yes     Kerin Salen 02/08/2020, 11:53 AM

## 2020-02-08 NOTE — Progress Notes (Signed)
Physical Therapy Treatment Patient Details Name: Kathleen Schneider MRN: [id number] DOB: 1940/10/28 Today's Date: 02/08/2020    History of Present Illness 79 yo female admitted with Pna, Sepsis after falling at home. Hx of CKD, gait abnormality, visual deficits, spinal stenosis    PT Comments    Pt appeared drowsy at start of session but she was agreeable to working with PT. During 1st standing attempt, pt had loss of bowels. Pt then began crying uncontrollably-unable to focus on task at hand. Unable to safely stand and pivot to commode so assisted pt back to bed for clean up. Sessions keep being hindered by pt's emotional state (multiple crying outbursts). Pt will need SNF.     Follow Up Recommendations  SNF     Equipment Recommendations  None recommended by PT    Recommendations for Other Services       Precautions / Restrictions Precautions Precautions: Fall Precaution Comments: monitor O2 Restrictions Weight Bearing Restrictions: No    Mobility  Bed Mobility Overal bed mobility: Needs Assistance Bed Mobility: Supine to Sit;Sit to Supine     Supine to sit: Mod assist;HOB elevated Sit to supine: Mod assist;HOB elevated   General bed mobility comments: Assist for LEs and trunk. Increased time and effort for pt. Cues required.  Transfers Overall transfer level: Needs assistance Equipment used: Rolling walker (2 wheeled) Transfers: Sit to/from Stand Sit to Stand: Mod assist;From elevated surface         General transfer comment: Stood x 1 but unable to move away from bed 2* bowel incontinence. Pt then began crying and could not focus enough to safely attempt. Assisted back to bed for clean up  Ambulation/Gait                 Stairs             Wheelchair Mobility    Modified Rankin (Stroke Patients Only)       Balance Overall balance assessment: Needs assistance;History of Falls         Standing balance support: Bilateral upper extremity  supported Standing balance-[name]: Poor                              Cognition Arousal/Alertness: Awake/alert Behavior During Therapy: WFL for tasks assessed/performed Overall Cognitive Status: Within Functional Limits for tasks assessed                                 General Comments: Patient sobbing at times during evaluation in regards to functional decline/current situation      Exercises      General Comments        Pertinent Vitals/Pain Pain Assessment: No/denies pain    Home Living                      Prior Function            PT Goals (current goals can now be found in the care plan section) Progress towards PT goals: Progressing toward goals    Frequency    Min 2X/week      PT Plan Current plan remains appropriate;Frequency needs to be updated    Co-evaluation              AM-PAC PT "6 Clicks" Mobility   Outcome Measure  Help needed turning from your back to your  side while in a flat bed without using bedrails?: A Lot Help needed moving from lying on your back to sitting on the side of a flat bed without using bedrails?: A Lot Help needed moving to and from a bed to a chair (including a wheelchair)?: A Lot Help needed standing up from a chair using your arms (e.g., wheelchair or bedside chair)?: A Lot Help needed to walk in hospital room?: A Lot Help needed climbing 3-5 steps with a railing? : Total 6 Click Score: 11    End of Session Equipment Utilized During Treatment: Gait belt Activity Tolerance: Patient limited by fatigue (limited by emotional state) Patient left: in bed;with call bell/phone within reach;with bed alarm set   PT Visit Diagnosis: Muscle weakness (generalized) (M62.81);History of falling (Z91.81);Unsteadiness on feet (R26.81)     Time: 1520-1605 PT Time Calculation (min) (ACUTE ONLY): 45 min  Charges:  $Therapeutic Activity: 38-52 mins                         Doreatha Massed,  PT Acute Rehabilitation  Office: [phone] Pager: [phone]

## 2020-02-08 NOTE — Progress Notes (Signed)
PROGRESS NOTE    Kathleen Schneider  YIF:[id number] DOB: 1940/10/01 DOA: 02/03/2020 PCP: Donald Prose, MD   Brief Narrative:79 y.o. female with medical history significant of hypertension, recurrent UTI, chronic kidney disease stage III, excessive daytime sleepiness, gait abnormalities, visual changes, who came in from home with shortness of breath and weakness.  Patient noticed progressive shortness of breath over the last 3 to 4 days.  Associated with some cough.  She felt very weak.  No fever or chills.  She has been weak that she has been mostly in bed.  She came to the ER where she was evaluated.  Patient was noted to be mildly hypoxic.  She also requires about 2 L of oxygen now.  She is COVID-19 negative but has evidence of pneumonia versus CHF.  Her BNP is elevated but also has leukocytosis and infiltrates that could be technically speaking and pneumonia.  She is got elevated creatinine which is slightly above her baseline.  She is therefore being admitted to the hospital for acute respiratory failure probably pneumonia but could be CHF as well..  ED Course: Temperature 98.1 blood pressure 130/59 pulse 108 respiratory rate of 24 oxygen sats 93% on 2 L.  White count is 18.2, hemoglobin 11.3, platelets 238 sodium 132 potassium 3.2 chloride 96 CO2 22 BUN 37 creatinine 2.05 and calcium 8.5.  Glucose 139.  COVID-19 screen is negative.  Urinalysis showed many bacteria with WBC 21-50 but negative leukocytes and nitrite.  Probably asymptomatic bacteriuria.  Chest x-ray showed left basilar opacity consistent with atelectasis versus infiltrate.  BNP is 382.  Patient being admitted with pneumonia as well as suspected cardiac cause.   Assessment & Plan:   Principal Problem:   Acute respiratory failure with hypoxia (HCC) Active Problems:   Spinal stenosis, lumbar   ARF (acute renal failure) (HCC)   Hyponatremia   Leucocytosis   Hypokalemia   Elevated brain natriuretic peptide (BNP) level   Sepsis  ([place])  #1  Sepsis present on admission as evidenced by lactic acidosis secondary to community-acquired pneumonia-  she was tachypneic tachycardic and febrile at 103.1 with lactic acidosis and leukocytosis.  Lactic acid level was 2.2 down to 1.1 Chest x-ray with left basilar opacity consistent with atelectasis or infiltrates. Continue Rocephin and azithromycin. Follow-up blood cultures-staph epidermidis. Blood pressure improved with IV fluids. Influenza negative Covid negative. Leukocytosis resolved. MRSA PCR negative.  Patient became hypoxic yesterday requiring 6 L of oxygen.  She received multiple doses of IV Lasix.  Today she has improved 97% on 2 L.  She is awake alert not in any respiratory distress.  #2 acute hypoxic respiratory failure secondary to CAP continue Rocephin azithromycin.  Patient was not on oxygen prior to admission to hospital.  She became hypoxic requiring 6 L of oxygen to maintain her sats above 90%.  Currently down to 2 L saturating 97%.  We will titrate oxygen down to keep sats above 92%.   #2 mild hyponatremia -sodium 137. Resolved.   #3 AKI on CKD stage IIIb -creatinine 1.60 DC IV fluids.  #4 elevated BNP?  This is likely related to AKI or obesity.  Echo 02/05/2020 with ejection fraction 65 to 70% with normal LV function no regional wall motion abnormalities.  Grade 1 diastolic dysfunction.  #5  Mildly elevated troponin 61 likely due to demand ischemia from #1 and CKD  #6 deconditioning patient lives alone seen by physical therapy recommending SNF Case manager aware.  #7 x-ray of the pelvis shows suspicious  sclerotic areas suspicious for malignancy.  Radiology recommending bone scan which is ordered.   Estimated body mass index is 36.25 kg/m as calculated from the following:   Height as of this encounter: 5' (1.524 m).   Weight as of this encounter: 84.2 kg.  DVT prophylaxis: Lovenox  code Status: Full code  family Communication: None at bedside    disposition Plan:  Status is: Inpatient  Dispo: The patient is from: Home              Anticipated d/c is to: Skilled nursing facility              Anticipated d/c date is: 2 days              Patient currently is not medically stable to d/c.    Consultants:   None  Procedures: None Antimicrobials: Rocephin and azithromycin  Subjective: Patient received Lasix in the last 24 hours with 7 canisters of urine output which is not recorded.  Information obtained from staff.  No events overnight.  She is awake and alert with improving respiratory status. Objective: Vitals:   02/07/20 1500 02/07/20 1941 02/08/20 0337 02/08/20 1030  BP:  122/68 119/64   Pulse:  72 72   Resp:  18 18   Temp:  98.9 F (37.2 C) 98.8 F (37.1 C)   TempSrc:  Oral Oral   SpO2: 95% 100% 99% 97%  Weight:      Height:        Intake/Output Summary (Last 24 hours) at 02/08/2020 1212 Last data filed at 02/08/2020 0539 Gross per 24 hour  Intake 480 ml  Output 5250 ml  Net -4770 ml   Filed Weights   02/04/20 1300  Weight: 84.2 kg    Examination:  General exam: Appears calm and comfortable  Respiratory system: Coarse breath sounds bases to auscultation.  Diminished breath sounds at the bases respiratory effort normal. Cardiovascular system: S1 & S2 heard, RRR. No JVD, murmurs, rubs, gallops or clicks. No pedal edema. Gastrointestinal system: Abdomen is nondistended, soft and nontender. No organomegaly or masses felt. Normal bowel sounds heard. Central nervous system: Alert and oriented. No focal neurological deficits. Extremities: Trace bilateral pitting edema skin: No rashes, lesions or ulcers Psychiatry: Judgement and insight appear normal. Mood & affect appropriate.     Data Reviewed: I have personally reviewed following labs and imaging studies  CBC: Recent Labs  Lab 02/03/20 1820 02/03/20 1820 02/04/20 7893 02/04/20 8101 02/04/20 2035 02/05/20 0417 02/06/20 0408 02/07/20 0321  02/08/20 0418  WBC 18.2*   < > 11.9*   < > 11.4* 27.6* 15.4* 11.5* 10.6*  NEUTROABS 16.4*  --  11.2*  --  10.4*  --   --   --   --   HGB 11.3*   < > 9.0*   < > 10.5* 8.4* 8.5* 8.9* 9.7*  HCT 35.8*   < > 28.0*   < > 33.2* 26.2* 27.2* 28.3* 30.8*  MCV 90.4   < > 89.2   < > 91.0 90.0 91.3 92.2 90.1  PLT 238   < > 187   < > 189 167 152 171 204   < > = values in this interval not displayed.   Basic Metabolic Panel: Recent Labs  Lab 02/04/20 2035 02/05/20 0417 02/06/20 0408 02/07/20 0321 02/08/20 0418  NA 133* 132* 137 136 135  K 3.5 3.5 3.0* 3.8 4.4  CL 100 101 105 105 97*  CO2 19* 22 22  24 28  GLUCOSE 106* 190* 146* 103* 89  BUN 40* 41* 37* 26* 23  CREATININE 1.70* 1.89* 1.54* 1.60* 1.62*  CALCIUM 8.5* 8.0* 8.4* 8.1* 8.6*   GFR: Estimated Creatinine Clearance: 26.7 mL/min (A) (by C-G formula based on SCr of 1.62 mg/dL (H)). Liver Function Tests: Recent Labs  Lab 02/04/20 2035 02/05/20 0417 02/06/20 0408 02/07/20 0321 02/08/20 0418  AST 20 17 21 18  14*  ALT 18 16 20 19 17   ALKPHOS 261* 134* 120 105 111  BILITOT 0.9 0.6 0.5 0.3 0.5  PROT 6.8 5.7* 5.6* 5.5* 6.3*  ALBUMIN 2.8* 2.2* 2.2* 2.1* 2.5*   No results for input(s): LIPASE, AMYLASE in the last 168 hours. No results for input(s): AMMONIA in the last 168 hours. Coagulation Profile: No results for input(s): INR, PROTIME in the last 168 hours. Cardiac Enzymes: Recent Labs  Lab 02/03/20 1820  CKTOTAL 60   BNP (last 3 results) No results for input(s): PROBNP in the last 8760 hours. HbA1C: No results for input(s): HGBA1C in the last 72 hours. CBG: No results for input(s): GLUCAP in the last 168 hours. Lipid Profile: No results for input(s): CHOL, HDL, LDLCALC, TRIG, CHOLHDL, LDLDIRECT in the last 72 hours. Thyroid Function Tests: No results for input(s): TSH, T4TOTAL, FREET4, T3FREE, THYROIDAB in the last 72 hours. Anemia Panel: No results for input(s): VITAMINB12, FOLATE, FERRITIN, TIBC, IRON, RETICCTPCT in  the last 72 hours. Sepsis Labs: Recent Labs  Lab 02/04/20 1533 02/04/20 1749 02/04/20 2035 02/04/20 2327  LATICACIDVEN 1.4 1.3 2.2* 1.1    Recent Results (from the past 240 hour(s))  Resp Panel by RT PCR (RSV, Flu A&B, Covid) - Nasopharyngeal Swab     Status: None   Collection Time: 02/03/20  6:25 PM   Specimen: Nasopharyngeal Swab  Result Value Ref Range Status   SARS Coronavirus 2 by RT PCR NEGATIVE NEGATIVE Final    Comment: (NOTE) SARS-CoV-2 target nucleic acids are NOT DETECTED.  The SARS-CoV-2 RNA is generally detectable in upper respiratoy specimens during the acute phase of infection. The lowest concentration of SARS-CoV-2 viral copies this assay can detect is 131 copies/mL. A negative result does not preclude SARS-Cov-2 infection and should not be used as the sole basis for treatment or other patient management decisions. A negative result may occur with  improper specimen collection/handling, submission of specimen other than nasopharyngeal swab, presence of viral mutation(s) within the areas targeted by this assay, and inadequate number of viral copies (<131 copies/mL). A negative result must be combined with clinical observations, patient history, and epidemiological information. The expected result is Negative.  Fact Sheet for Patients:  PinkCheek.be  Fact Sheet for Healthcare Providers:  GravelBags.it  This test is no t yet approved or cleared by the [place] FDA and  has been authorized for detection and/or diagnosis of SARS-CoV-2 by FDA under an Emergency Use Authorization (EUA). This EUA will remain  in effect (meaning this test can be used) for the duration of the COVID-19 declaration under Section 564(b)(1) of the Act, 21 U.S.C. section 360bbb-3(b)(1), unless the authorization is terminated or revoked sooner.     Influenza A by PCR NEGATIVE NEGATIVE Final   Influenza B by PCR NEGATIVE  NEGATIVE Final    Comment: (NOTE) The Xpert Xpress SARS-CoV-2/FLU/RSV assay is intended as an aid in  the diagnosis of influenza from Nasopharyngeal swab specimens and  should not be used as a sole basis for treatment. Nasal washings and  aspirates are unacceptable for Xpert  Xpress SARS-CoV-2/FLU/RSV  testing.  Fact Sheet for Patients: PinkCheek.be  Fact Sheet for Healthcare Providers: GravelBags.it  This test is not yet approved or cleared by the [place] FDA and  has been authorized for detection and/or diagnosis of SARS-CoV-2 by  FDA under an Emergency Use Authorization (EUA). This EUA will remain  in effect (meaning this test can be used) for the duration of the  Covid-19 declaration under Section 564(b)(1) of the Act, 21  U.S.C. section 360bbb-3(b)(1), unless the authorization is  terminated or revoked.    Respiratory Syncytial Virus by PCR NEGATIVE NEGATIVE Final    Comment: (NOTE) Fact Sheet for Patients: PinkCheek.be  Fact Sheet for Healthcare Providers: GravelBags.it  This test is not yet approved or cleared by the [place] FDA and  has been authorized for detection and/or diagnosis of SARS-CoV-2 by  FDA under an Emergency Use Authorization (EUA). This EUA will remain  in effect (meaning this test can be used) for the duration of the  COVID-19 declaration under Section 564(b)(1) of the Act, 21 U.S.C.  section 360bbb-3(b)(1), unless the authorization is terminated or  revoked. Performed at [facility], [place] [address], [place], [place] [address]   Culture, blood (routine x 2) Call MD if unable to obtain prior to antibiotics being given     Status: Abnormal   Collection Time: 02/04/20  6:45 AM   Specimen: BLOOD  Result Value Ref Range Status   Specimen Description   Final    BLOOD LEFT ANTECUBITAL Performed at [facility] [address] [place], [place] [address]    Special Requests   Final    BOTTLES DRAWN AEROBIC AND ANAEROBIC Blood Culture adequate volume Performed at [facility] [address] [place], [place] [address]    Culture  Setup Time   Final    AEROBIC BOTTLE ONLY GRAM POSITIVE COCCI Organism ID to follow CRITICAL RESULT CALLED TO, READ BACK BY AND VERIFIED WITH: E JACKSON PHARMD 02/06/20 0210 JDW    Culture (A)  Final    STAPHYLOCOCCUS EPIDERMIDIS THE SIGNIFICANCE OF ISOLATING THIS ORGANISM FROM A SINGLE SET OF BLOOD CULTURES WHEN MULTIPLE SETS ARE DRAWN IS UNCERTAIN. PLEASE NOTIFY THE MICROBIOLOGY DEPARTMENT WITHIN ONE WEEK IF SPECIATION AND SENSITIVITIES ARE REQUIRED. Performed at [facility], [place] [address] [place], [place] [address]    Report Status 02/06/2020 FINAL  Final  Blood Culture ID Panel (Reflexed)     Status: Abnormal   Collection Time: 02/04/20  6:45 AM  Result Value Ref Range Status   Enterococcus faecalis NOT DETECTED NOT DETECTED Final   Enterococcus Faecium NOT DETECTED NOT DETECTED Final   Listeria monocytogenes NOT DETECTED NOT DETECTED Final   Staphylococcus species DETECTED (A) NOT DETECTED Final    Comment: CRITICAL RESULT CALLED TO, READ BACK BY AND VERIFIED WITH: E JACKSON PHARMD 02/06/20 0210 JDW    Staphylococcus aureus (BCID) NOT DETECTED NOT DETECTED Final   Staphylococcus epidermidis DETECTED (A) NOT DETECTED Final    Comment: Methicillin (oxacillin) resistant coagulase negative staphylococcus. Possible blood culture contaminant (unless isolated from more than one blood culture draw or clinical case suggests pathogenicity). No antibiotic treatment is indicated for blood  culture contaminants. CRITICAL RESULT CALLED TO, READ BACK BY AND VERIFIED WITH: E JACKSON PHARMD 02/06/20 0210 JDW    Staphylococcus lugdunensis NOT DETECTED NOT DETECTED Final   Streptococcus species NOT DETECTED NOT DETECTED Final    Streptococcus agalactiae NOT DETECTED NOT DETECTED Final  Streptococcus pneumoniae NOT DETECTED NOT DETECTED Final   Streptococcus pyogenes NOT DETECTED NOT DETECTED Final   A.calcoaceticus-baumannii NOT DETECTED NOT DETECTED Final   Bacteroides fragilis NOT DETECTED NOT DETECTED Final   Enterobacterales NOT DETECTED NOT DETECTED Final   Enterobacter cloacae complex NOT DETECTED NOT DETECTED Final   Escherichia coli NOT DETECTED NOT DETECTED Final   Klebsiella aerogenes NOT DETECTED NOT DETECTED Final   Klebsiella oxytoca NOT DETECTED NOT DETECTED Final   Klebsiella pneumoniae NOT DETECTED NOT DETECTED Final   Proteus species NOT DETECTED NOT DETECTED Final   Salmonella species NOT DETECTED NOT DETECTED Final   Serratia marcescens NOT DETECTED NOT DETECTED Final   Haemophilus influenzae NOT DETECTED NOT DETECTED Final   Neisseria meningitidis NOT DETECTED NOT DETECTED Final   Pseudomonas aeruginosa NOT DETECTED NOT DETECTED Final   Stenotrophomonas maltophilia NOT DETECTED NOT DETECTED Final   Candida albicans NOT DETECTED NOT DETECTED Final   Candida auris NOT DETECTED NOT DETECTED Final   Candida glabrata NOT DETECTED NOT DETECTED Final   Candida krusei NOT DETECTED NOT DETECTED Final   Candida parapsilosis NOT DETECTED NOT DETECTED Final   Candida tropicalis NOT DETECTED NOT DETECTED Final   Cryptococcus neoformans/gattii NOT DETECTED NOT DETECTED Final   Methicillin resistance mecA/C DETECTED (A) NOT DETECTED Final    Comment: CRITICAL RESULT CALLED TO, READ BACK BY AND VERIFIED WITHSeleta Rhymes [facility] 02/06/20 0210 JDW Performed at [facility], [address] [place], [place] [address]   Culture, blood (routine x 2) Call MD if unable to obtain prior to antibiotics being given     Status: None (Preliminary result)   Collection Time: 02/04/20  7:10 AM   Specimen: BLOOD RIGHT HAND  Result Value Ref Range Status   Specimen Description   Final    BLOOD RIGHT HAND Performed  at [facility] [address], [place], [place] [address]    Special Requests   Final    BOTTLES DRAWN AEROBIC AND ANAEROBIC Blood Culture adequate volume Performed at [facility] [address] [place], [place] [address]    Culture   Final    NO GROWTH 4 DAYS Performed at [facility], [place] [address], [place], [place] [address]    Report Status PENDING  Incomplete  MRSA PCR Screening     Status: None   Collection Time: 02/05/20  1:37 PM   Specimen: Nasal Mucosa; Nasopharyngeal  Result Value Ref Range Status   MRSA by PCR NEGATIVE NEGATIVE Final    Comment:        The GeneXpert MRSA Assay (FDA approved for NASAL specimens only), is one component of a comprehensive MRSA colonization surveillance program. It is not intended to diagnose MRSA infection nor to guide or monitor treatment for MRSA infections. Performed at [facility], [place] [address] [place], [place] [address]          Radiology Studies: DG CHEST PORT 1 VIEW  Result Date: 02/07/2020 CLINICAL DATA:  New onset shortness of breath EXAM: PORTABLE CHEST 1 VIEW COMPARISON:  Three days ago FINDINGS: Large lung volumes with diaphragm flattening. Trace pleural effusions and generalized reticulation that is stable. Normal heart size for technique. No pneumothorax. IMPRESSION: 1. Trace pleural effusions with mild scarring or atelectasis at the left base. 2. Stable interstitial prominence, favored bronchitic Electronically Signed   By: Monte Fantasia M.D.   On: 02/07/2020 08:46   DG HIPS BILAT WITH PELVIS 3-4 VIEWS  Result Date: 02/07/2020 CLINICAL DATA:  Increased weakness and shortness of breath. EXAM: DG HIP (WITH OR WITHOUT PELVIS) 3-4V BILAT COMPARISON:  None. FINDINGS: There is no evidence of an acute hip fracture or dislocation. Mild degenerative changes seen involving both hips, in the form of joint space narrowing and acetabular sclerosis. Numerous  ill-defined sclerotic foci are seen scattered throughout the pelvis and bilateral lower extremities. IMPRESSION: 1. Numerous sclerotic foci, as described above, which may represent sequelae associated with osseous metastasis. Correlation with a nuclear medicine bone scan is recommended. 2. Degenerative changes involving both hips. Electronically Signed   By: Virgina Norfolk M.D.   On: 02/07/2020 16:12        Scheduled Meds: . enoxaparin (LOVENOX) injection  30 mg Subcutaneous Q24H  . escitalopram  5 mg Oral Daily  . lamoTRIgine  150 mg Oral BID  . lidocaine  2 patch Transdermal Q24H  . oxybutynin  5 mg Oral BID   Continuous Infusions: . sodium chloride    . cefTRIAXone (ROCEPHIN)  IV 2 g (02/07/20 2101)     LOS: 5 days   Georgette Shell, MD 02/08/2020, 12:12 PM

## 2020-02-08 NOTE — TOC Initial Note (Signed)
Transition of Care [facility]) - Initial/Assessment Note    Patient Details  Name: Kathleen Schneider MRN: [id number] Date of Birth: 1940-05-21  Transition of Care [facility]) CM/SW Contact:    Trish Mage, LCSW Phone Number: 02/08/2020, 11:43 AM  Clinical Narrative:   Patient seen in follow up to PT recommendation of SNF.  Kathleen Schneider admits to significant weakness, has been to rehab in the past and reluctantly agrees that is what she needs in order to return home as she lives by herself with no one there to care for her.  Bed search initiated.  Insurance authorization initiated. TOC will continue to follow during the course of hospitalization.                 Expected Discharge Plan: Skilled Nursing Facility Barriers to Discharge: SNF Pending bed offer   Patient Goals and CMS Choice     Choice offered to / list presented to : Patient  Expected Discharge Plan and Services Expected Discharge Plan: [place]   Discharge Planning Services: CM Consult Post Acute Care Choice: [place] Living arrangements for the past 2 months: Single Family Home                                      Prior Living Arrangements/Services Living arrangements for the past 2 months: Single Family Home Lives with:: Self Patient language and need for interpreter reviewed:: Yes        Need for Family Participation in Patient Care: No (Comment) Care giver support system in place?: Yes (comment) Current home services: DME Criminal Activity/Legal Involvement Pertinent to Current Situation/Hospitalization: No - Comment as needed  Activities of Daily Living Home Assistive Devices/Equipment: Cane (specify quad or straight), Eyeglasses, Walker (specify type) (walk-in shower, single point cane, front wheeled walker) ADL Screening (condition at time of admission) Patient's cognitive ability adequate to safely complete daily activities?: Yes Is the patient deaf or have difficulty  hearing?: No Does the patient have difficulty seeing, even when wearing glasses/contacts?: No Does the patient have difficulty concentrating, remembering, or making decisions?: No Patient able to express need for assistance with ADLs?: Yes Does the patient have difficulty dressing or bathing?: Yes Independently performs ADLs?: No Communication: Independent Dressing (OT): Needs assistance Is this a change from baseline?: Change from baseline, expected to last >3 days Grooming: Needs assistance Is this a change from baseline?: Change from baseline, expected to last >3 days Feeding: Needs assistance Is this a change from baseline?: Change from baseline, expected to last >3 days Bathing: Needs assistance Is this a change from baseline?: Change from baseline, expected to last >3 days Toileting: Needs assistance Is this a change from baseline?: Change from baseline, expected to last >3days In/Out Bed: Needs assistance Is this a change from baseline?: Change from baseline, expected to last >3 days Walks in Home: Needs assistance Is this a change from baseline?: Change from baseline, expected to last >3 days Does the patient have difficulty walking or climbing stairs?: Yes (secondary to shortness of breath and weakness) Weakness of Legs: Both Weakness of Arms/Hands: None  Permission Sought/Granted                  Emotional Assessment Appearance:: Appears stated age Attitude/Demeanor/Rapport: Engaged Affect (typically observed): Appropriate Orientation: : Oriented to Self, Oriented to Place, Oriented to  Time, Oriented to Situation Alcohol / Substance Use: Not Applicable  Psych Involvement: No (comment)  Admission diagnosis:  Acute respiratory failure with hypoxia ([place]) [J96.01] Fall, initial encounter [W19.XXXA] Community acquired pneumonia of left lower lobe of lung [J18.9] Patient Active Problem List   Diagnosis Date Noted  . Hyponatremia 02/03/2020  . Leucocytosis 02/03/2020   . Hypokalemia 02/03/2020  . Acute respiratory failure with hypoxia ([place]) 02/03/2020  . Elevated brain natriuretic peptide (BNP) level 02/03/2020  . Sepsis ([place]) 02/03/2020  . ARF (acute renal failure) ([place]) 12/09/2019  . Fall at home, initial encounter 12/09/2019  . Acute lower UTI 12/09/2019  . Rhabdomyolysis 12/09/2019  . Dehydration 12/09/2019  . Advanced age 108/25/2021  . Bilateral knee pain 12/09/2019  . Facet joint syndrome 05/13/2017  . Spinal stenosis, lumbar 09/25/2016  . Lower back pain 03/27/2016  . Excessive daytime sleepiness 05/24/2015  . Postherpetic neuralgia 02/18/2015  . Dysesthesia 02/18/2015  . Insomnia 02/18/2015  . Snoring 02/18/2015   PCP:  Donald Prose, MD Pharmacy:   [id number] - [place], [place] [address] Phone: [phone] Fax: [phone]     Social Determinants of Health (SDOH) Interventions    Readmission Risk Interventions Readmission Risk Prevention Plan 12/10/2019  Medication Screening Complete  Transportation Screening Complete  Some recent data might be hidden

## 2020-02-09 ENCOUNTER — Inpatient Hospital Stay (HOSPITAL_COMMUNITY): Payer: PPO

## 2020-02-09 DIAGNOSIS — J9601 Acute respiratory failure with hypoxia: Secondary | ICD-10-CM | POA: Diagnosis not present

## 2020-02-09 LAB — CULTURE, BLOOD (ROUTINE X 2)
Culture: NO GROWTH
Special Requests: ADEQUATE

## 2020-02-09 MED ORDER — TECHNETIUM TC 99M MEDRONATE IV KIT
21.2000 | PACK | Freq: Once | INTRAVENOUS | Status: AC | PRN
  Administered 2020-02-09: 21.2 via INTRAVENOUS

## 2020-02-09 NOTE — TOC Transition Note (Addendum)
Transition of Care [facility]) - CM/SW Discharge Note   Patient Details  Name: Kathleen Schneider MRN: [id number] Date of Birth: 27-Aug-1940  Transition of Care [facility]) CM/SW Contact:  Trish Mage, LCSW Phone Number: 02/09/2020, 10:42 AM   Clinical Narrative:   Patient has bed offer at [facility].  I called [employer] for update on authorization.  They are still reviewing for authorization. TOC will continue to follow during the course of hospitalization. Addendum:  [facility] is offering peer to peer as they have determined the patient is custodial care.  MD alerted. Addendum II: Patient has been denied coverage for short term rehab.  She has insurance authorization for an ambulance ride when there is a destination.  Auth # [id number].  Addendum III:  Met with patient to review the findings of the day.  Began by finding out current mental status.  [name] was able to tell me who is she is, where she is, what the date is [name] was off by 2 days, 24 instead of 26]  and who the president is.  She is aware that she is in need of 24 hour care, knows how much it costs to have 24/7 care in the home [over $600.00 per day], knows her monthly income, which is approximately 3200.00, knows she cannot afford to go to a facility nor get round the clock care, and is feeling overwhelmed and scared about her situation, which causes her to cry uncontrollably periodically.  She is able to pull herself together with reassurance and patience from me.    At his point I will see if patient is eligible for [facility] services so that we could seamlessly hand her off to a supportive team that could continue to help her navigate this situation.  I will also talk to her about getting someone into the home for X number of hours per day, depending on what she thinks she can afford v what she thinks is essential, and help her negotiate setting that up.  We will also reach out to her listed contact, who she states cannot currently be involved  because her husband is set to go in for hip replacement surgery.     Final next level of care: Skilled Nursing Facility Barriers to Discharge: Other (comment) (SNF pending insurance)   Patient Goals and CMS Choice     Choice offered to / list presented to : Patient  Discharge Placement                       Discharge Plan and Services   Discharge Planning Services: CM Consult Post Acute Care Choice: [place]                               Social Determinants of Health (SDOH) Interventions     Readmission Risk Interventions Readmission Risk Prevention Plan 12/10/2019  Medication Screening Complete  Transportation Screening Complete  Some recent data might be hidden

## 2020-02-09 NOTE — Discharge Summary (Signed)
Physician Discharge Summary  Kathleen Schneider HWE:[id number] DOB: [birth year] DOA: 02/03/2020  PCP: Donald Prose, MD  Admit date: 02/03/2020 Discharge date: 02/09/2020  Admitted From: Home Disposition: Skilled nursing facility Recommendations for Outpatient Follow-up:  1. Follow up with PCP in 1-2 weeks 2. Please obtain BMP/CBC in one week  Home Health: None Equipment/Devices: None Discharge Condition stable CODE STATUS full code Diet recommendation: Cardiac Brief/Interim Summary:79 y.o.femalewith medical history significant ofhypertension, recurrent UTI, chronic kidney disease stage III, excessive daytime sleepiness, gait abnormalities, visual changes, who came in from home with shortness of breath and weakness. Patient noticed progressive shortness of breath over the last 3 to 4 days. Associated with some cough. She felt very weak. No fever or chills. She has been weak that she has been mostly in bed. She came to the ER where she was evaluated. Patient was noted to be mildly hypoxic. She also requires about 2 L of oxygen now. She is COVID-19 negative but has evidence of pneumonia versus CHF. Her BNP is elevated but also has leukocytosis and infiltrates that could be technically speaking and pneumonia. She is got elevated creatinine which is slightly above her baseline. She is therefore being admitted to the hospital for acute respiratory failure probably pneumonia but could be CHF as well..  ED Course:Temperature 98.1 blood pressure 130/59 pulse108respiratory rate of 24 oxygen sats 93% on 2 L. White count is 18.2, hemoglobin 11.3, platelets 238 sodium 132 potassium 3.2 chloride 96 CO2 22 BUN 37 creatinine 2.05 and calcium 8.5. Glucose 139. COVID-19 screen is negative.Urinalysis showed many bacteria with WBC 21-50 but negative leukocytes and nitrite. Probably asymptomatic bacteriuria. Chest x-ray showed left basilar opacity consistent with atelectasis versus infiltrate.  BNP is 382. Patient being admitted with pneumonia as well as suspected cardiac cause.   Discharge Diagnoses:  Principal Problem:   Acute respiratory failure with hypoxia (HCC) Active Problems:   Spinal stenosis, lumbar   ARF (acute renal failure) (HCC)   Hyponatremia   Leucocytosis   Hypokalemia   Elevated brain natriuretic peptide (BNP) level   Sepsis ([place])   #1  Sepsis present on admission as evidenced by lactic acidosis secondary to community-acquired pneumonia-  she was tachypneic tachycardic and febrile at 103.1 with lactic acidosis and leukocytosis.  Lactic acid level was 2.2 down to 1.1 Chest x-ray with left basilar opacity consistent with atelectasis or infiltrates. She was treated with Rocephin and azithromycin. Continue Rocephin and azithromycin. Follow-up blood cultures-staph epidermidis. She was Covid negative and influenza negative and MRSA PCR negative.  #2 acute hypoxic respiratory failure secondary to CAP-she was treated with Rocephin and azithromycin.   Encourage her to use incentive spirometry at the nursing home.  #2 mild hyponatremia -sodium 137. Resolved.   #3 AKI on CKD stage IIIb -creatinine 1.60 improved with IV fluids.  #4 elevated BNP?  This is likely related to AKI or obesity.  Echo 02/05/2020 with ejection fraction 65 to 70% with normal LV function no regional wall motion abnormalities.  Grade 1 diastolic dysfunction. She is not on any diuretics.  Monitor closely for any fluid overload be cautious with any IV hydration.   Estimated body mass index is 36.25 kg/m as calculated from the following:   Height as of this encounter: 5' (1.524 m).   Weight as of this encounter: 84.2 kg.  Discharge Instructions  Discharge Instructions    Diet - low sodium heart healthy   Complete by: As directed    Increase activity slowly   Complete by:  As directed      Allergies as of 02/09/2020   No Known Allergies     Medication List    STOP taking  these medications   collagenase ointment Commonly known as: SANTYL     TAKE these medications   escitalopram 5 MG tablet Commonly known as: LEXAPRO Take 5 mg by mouth daily.   lamoTRIgine 150 MG tablet Commonly known as: LAMICTAL TAKE 1 TABLET BY MOUTH TWICE A DAY   lidocaine 5 % Commonly known as: LIDODERM Place 1 patch onto the skin daily. Remove & Discard patch within 12 hours or as directed by MD   meloxicam 7.5 MG tablet Commonly known as: MOBIC Take 7.5 mg by mouth daily.   oxybutynin 5 MG tablet Commonly known as: [place] 1 TABLET BY MOUTH TWICE A DAY       Follow-up Information    Donald Prose, MD Follow up.   Specialty: Family Medicine Contact information: [address] [place] [phone]              No Known Allergies  Consultations:  None   Procedures/Studies: DG CHEST PORT 1 VIEW  Result Date: 02/07/2020 CLINICAL DATA:  New onset shortness of breath EXAM: PORTABLE CHEST 1 VIEW COMPARISON:  Three days ago FINDINGS: Large lung volumes with diaphragm flattening. Trace pleural effusions and generalized reticulation that is stable. Normal heart size for technique. No pneumothorax. IMPRESSION: 1. Trace pleural effusions with mild scarring or atelectasis at the left base. 2. Stable interstitial prominence, favored bronchitic Electronically Signed   By: Monte Fantasia M.D.   On: 02/07/2020 08:46   DG CHEST PORT 1 VIEW  Result Date: 02/04/2020 CLINICAL DATA:  Shortness of breath. EXAM: PORTABLE CHEST 1 VIEW COMPARISON:  February 03, 2020 FINDINGS: Mild, stable, diffuse chronic appearing increased interstitial lung markings are seen. Mild, stable atelectasis is noted within the lateral aspect of the left lung base. There is no evidence of a pleural effusion or pneumothorax. The heart size and mediastinal contours are within normal limits. Radiopaque and lucent artifact is seen overlying the lateral aspect of the mid  left chest wall. The visualized skeletal structures are otherwise unremarkable. IMPRESSION: Mild, stable left basilar atelectasis. Electronically Signed   By: Virgina Norfolk M.D.   On: 02/04/2020 20:00   DG Chest Port 1 View  Result Date: 02/03/2020 CLINICAL DATA:  Shortness of breath and cough for 1 month EXAM: PORTABLE CHEST 1 VIEW COMPARISON:  None. FINDINGS: Cardiac shadow is at the upper limits of normal in size. Aortic calcifications are seen. The lungs are well aerated bilaterally with mild left basilar atelectasis/early infiltrate. No bony abnormality is noted. IMPRESSION: Left basilar opacity consistent with atelectasis/infiltrate. Electronically Signed   By: Inez Catalina M.D.   On: 02/03/2020 20:06   ECHOCARDIOGRAM COMPLETE  Result Date: 02/05/2020    ECHOCARDIOGRAM REPORT   Patient Name:   SHAIANNE NUCCI Date of Exam: 02/04/2020 Medical Rec #:  [id number]       Height:       60.0 in Accession #:    [id number]      Weight:       185.6 lb Date of Birth:  1940/11/06       BSA:          1.808 m Patient Age:    57 years        BP:           124/83 mmHg Patient Gender:  F               HR:           76 bpm. Exam Location:  Inpatient Procedure: 2D Echo Indications:    786.09 dyspnea  History:        Patient has no prior history of Echocardiogram examinations.                 Risk Factors:Former Smoker and Hypertension.  Sonographer:    Jannett Celestine RDCS (AE) Referring Phys: [id number] [facility] Julious Oka  Sonographer Comments: Image acquisition challenging due to patient body habitus. IMPRESSIONS  1. Left ventricular ejection fraction, by estimation, is 65 to 70%. The left ventricle has normal function. The left ventricle has no regional wall motion abnormalities. Left ventricular diastolic parameters are consistent with Grade I diastolic dysfunction (impaired relaxation). Elevated left atrial pressure.  2. Right ventricular systolic function is normal. The right ventricular size is normal. There is normal  pulmonary artery systolic pressure.  3. The mitral valve is normal in structure. Mild mitral valve regurgitation. No evidence of mitral stenosis.  4. The aortic valve is normal in structure. Aortic valve regurgitation is not visualized. Mild to moderate aortic valve sclerosis/calcification is present, without any evidence of aortic stenosis.  5. The inferior vena cava is normal in size with greater than 50% respiratory variability, suggesting right atrial pressure of 3 mmHg. Comparison(s): No prior Echocardiogram. FINDINGS  Left Ventricle: Left ventricular ejection fraction, by estimation, is 65 to 70%. The left ventricle has normal function. The left ventricle has no regional wall motion abnormalities. The left ventricular internal cavity size was normal in size. There is  no left ventricular hypertrophy. Left ventricular diastolic parameters are consistent with Grade I diastolic dysfunction (impaired relaxation). Elevated left atrial pressure. Right Ventricle: The right ventricular size is normal. No increase in right ventricular wall thickness. Right ventricular systolic function is normal. There is normal pulmonary artery systolic pressure. Left Atrium: Left atrial size was normal in size. Right Atrium: Right atrial size was normal in size. Pericardium: There is no evidence of pericardial effusion. Mitral Valve: The mitral valve is normal in structure. Mild mitral valve regurgitation. No evidence of mitral valve stenosis. Tricuspid Valve: The tricuspid valve is normal in structure. Tricuspid valve regurgitation is trivial. No evidence of tricuspid stenosis. Aortic Valve: The aortic valve is normal in structure. Aortic valve regurgitation is not visualized. Mild to moderate aortic valve sclerosis/calcification is present, without any evidence of aortic stenosis. Pulmonic Valve: The pulmonic valve was normal in structure. Pulmonic valve regurgitation is not visualized. No evidence of pulmonic stenosis. Aorta: The  aortic root is normal in size and structure. Venous: The inferior vena cava is normal in size with greater than 50% respiratory variability, suggesting right atrial pressure of 3 mmHg. IAS/Shunts: No atrial level shunt detected by color flow Doppler.  LEFT VENTRICLE PLAX 2D LVIDd:         5.07 cm  Diastology LVIDs:         2.88 cm  LV e' medial:    8.38 cm/s LV PW:         0.85 cm  LV E/e' medial:  11.8 LV IVS:        0.90 cm  LV e' lateral:   8.05 cm/s LVOT diam:     2.20 cm  LV E/e' lateral: 12.2 LV SV:         97 LV SV Index:   54 LVOT Area:  3.80 cm  RIGHT VENTRICLE RV S prime:     11.10 cm/s TAPSE (M-mode): 1.8 cm LEFT ATRIUM           Index LA diam:      4.00 cm 2.21 cm/m LA Vol (A2C): 55.8 ml 30.86 ml/m  AORTIC VALVE LVOT Vmax:   99.80 cm/s LVOT Vmean:  65.100 cm/s LVOT VTI:    0.256 m  AORTA Ao Root diam: 2.70 cm MITRAL VALVE MV Area (PHT): 3.27 cm    SHUNTS MV Decel Time: 232 msec    Systemic VTI:  0.26 m MV E velocity: 98.50 cm/s  Systemic Diam: 2.20 cm MV A velocity: 88.30 cm/s MV E/A ratio:  1.12 Ena Dawley MD Electronically signed by Ena Dawley MD Signature Date/Time: 02/05/2020/9:18:19 AM    Final    DG HIPS BILAT WITH PELVIS 3-4 VIEWS  Result Date: 02/07/2020 CLINICAL DATA:  Increased weakness and shortness of breath. EXAM: DG HIP (WITH OR WITHOUT PELVIS) 3-4V BILAT COMPARISON:  None. FINDINGS: There is no evidence of an acute hip fracture or dislocation. Mild degenerative changes seen involving both hips, in the form of joint space narrowing and acetabular sclerosis. Numerous ill-defined sclerotic foci are seen scattered throughout the pelvis and bilateral lower extremities. IMPRESSION: 1. Numerous sclerotic foci, as described above, which may represent sequelae associated with osseous metastasis. Correlation with a nuclear medicine bone scan is recommended. 2. Degenerative changes involving both hips. Electronically Signed   By: Virgina Norfolk M.D.   On: 02/07/2020 16:12     (Echo, Carotid, EGD, Colonoscopy, ERCP)    Subjective:  Patient resting in bed awake and alert anxious to go to rehab eating breakfast feels better denies difficulty in breathing  Discharge Exam: Vitals:   02/08/20 1942 02/09/20 0338  BP: 116/65 119/70  Pulse: 71 71  Resp: (!) 21 15  Temp: 98 F (36.7 C) 98 F (36.7 C)  SpO2: 97% 100%   Vitals:   02/08/20 1030 02/08/20 1317 02/08/20 1942 02/09/20 0338  BP:  115/63 116/65 119/70  Pulse:  73 71 71  Resp:  16 (!) 21 15  Temp:  98 F (36.7 C) 98 F (36.7 C) 98 F (36.7 C)  TempSrc:  Oral Oral   SpO2: 97% 99% 97% 100%  Weight:      Height:        General: Pt is alert, awake, not in acute distress Cardiovascular: RRR, S1/S2 +, no rubs, no gallops Respiratory: DIMINISHED BREATHSOUNDS BASES bilaterally, no wheezing, no rhonchi Abdominal: Soft, NT, ND, bowel sounds + Extremities: no edema, no cyanosis    The results of significant diagnostics from this hospitalization (including imaging, microbiology, ancillary and laboratory) are listed below for reference.     Microbiology: Recent Results (from the past 240 hour(s))  Resp Panel by RT PCR (RSV, Flu A&B, Covid) - Nasopharyngeal Swab     Status: None   Collection Time: 02/03/20  6:25 PM   Specimen: Nasopharyngeal Swab  Result Value Ref Range Status   SARS Coronavirus 2 by RT PCR NEGATIVE NEGATIVE Final    Comment: (NOTE) SARS-CoV-2 target nucleic acids are NOT DETECTED.  The SARS-CoV-2 RNA is generally detectable in upper respiratoy specimens during the acute phase of infection. The lowest concentration of SARS-CoV-2 viral copies this assay can detect is 131 copies/mL. A negative result does not preclude SARS-Cov-2 infection and should not be used as the sole basis for treatment or other patient management decisions. A negative result may occur with  improper specimen collection/handling, submission of specimen other than nasopharyngeal swab, presence of viral  mutation(s) within the areas targeted by this assay, and inadequate number of viral copies (<131 copies/mL). A negative result must be combined with clinical observations, patient history, and epidemiological information. The expected result is Negative.  Fact Sheet for Patients:  PinkCheek.be  Fact Sheet for Healthcare Providers:  GravelBags.it  This test is no t yet approved or cleared by the [place] FDA and  has been authorized for detection and/or diagnosis of SARS-CoV-2 by FDA under an Emergency Use Authorization (EUA). This EUA will remain  in effect (meaning this test can be used) for the duration of the COVID-19 declaration under Section 564(b)(1) of the Act, 21 U.S.C. section 360bbb-3(b)(1), unless the authorization is terminated or revoked sooner.     Influenza A by PCR NEGATIVE NEGATIVE Final   Influenza B by PCR NEGATIVE NEGATIVE Final    Comment: (NOTE) The Xpert Xpress SARS-CoV-2/FLU/RSV assay is intended as an aid in  the diagnosis of influenza from Nasopharyngeal swab specimens and  should not be used as a sole basis for treatment. Nasal washings and  aspirates are unacceptable for Xpert Xpress SARS-CoV-2/FLU/RSV  testing.  Fact Sheet for Patients: PinkCheek.be  Fact Sheet for Healthcare Providers: GravelBags.it  This test is not yet approved or cleared by the [place] FDA and  has been authorized for detection and/or diagnosis of SARS-CoV-2 by  FDA under an Emergency Use Authorization (EUA). This EUA will remain  in effect (meaning this test can be used) for the duration of the  Covid-19 declaration under Section 564(b)(1) of the Act, 21  U.S.C. section 360bbb-3(b)(1), unless the authorization is  terminated or revoked.    Respiratory Syncytial Virus by PCR NEGATIVE NEGATIVE Final    Comment: (NOTE) Fact Sheet for  Patients: PinkCheek.be  Fact Sheet for Healthcare Providers: GravelBags.it  This test is not yet approved or cleared by the [place] FDA and  has been authorized for detection and/or diagnosis of SARS-CoV-2 by  FDA under an Emergency Use Authorization (EUA). This EUA will remain  in effect (meaning this test can be used) for the duration of the  COVID-19 declaration under Section 564(b)(1) of the Act, 21 U.S.C.  section 360bbb-3(b)(1), unless the authorization is terminated or  revoked. Performed at [facility], [place] [address], [place], [place] [address]   Culture, blood (routine x 2) Call MD if unable to obtain prior to antibiotics being given     Status: Abnormal   Collection Time: 02/04/20  6:45 AM   Specimen: BLOOD  Result Value Ref Range Status   Specimen Description   Final    BLOOD LEFT ANTECUBITAL Performed at [place] [address], [place], [place] [address]    Special Requests   Final    BOTTLES DRAWN AEROBIC AND ANAEROBIC Blood Culture adequate volume Performed at [facility] [address], [place], [place] [address]    Culture  Setup Time   Final    AEROBIC BOTTLE ONLY GRAM POSITIVE COCCI Organism ID to follow CRITICAL RESULT CALLED TO, READ BACK BY AND VERIFIED WITH: E JACKSON PHARMD 02/06/20 0210 JDW    Culture (A)  Final    STAPHYLOCOCCUS EPIDERMIDIS THE SIGNIFICANCE OF ISOLATING THIS ORGANISM FROM A SINGLE SET OF BLOOD CULTURES WHEN MULTIPLE SETS ARE DRAWN IS UNCERTAIN. PLEASE NOTIFY THE MICROBIOLOGY DEPARTMENT WITHIN ONE WEEK IF SPECIATION AND SENSITIVITIES ARE REQUIRED. Performed at [facility], [address] [place], [place] [address]    Report Status 02/06/2020 FINAL  Final  Blood Culture ID Panel (Reflexed)     Status: Abnormal   Collection Time: 02/04/20  6:45 AM  Result Value Ref Range Status   Enterococcus faecalis  NOT DETECTED NOT DETECTED Final   Enterococcus Faecium NOT DETECTED NOT DETECTED Final   Listeria monocytogenes NOT DETECTED NOT DETECTED Final   Staphylococcus species DETECTED (A) NOT DETECTED Final    Comment: CRITICAL RESULT CALLED TO, READ BACK BY AND VERIFIED WITH: E JACKSON PHARMD 02/06/20 0210 JDW    Staphylococcus aureus (BCID) NOT DETECTED NOT DETECTED Final   Staphylococcus epidermidis DETECTED (A) NOT DETECTED Final    Comment: Methicillin (oxacillin) resistant coagulase negative staphylococcus. Possible blood culture contaminant (unless isolated from more than one blood culture draw or clinical case suggests pathogenicity). No antibiotic treatment is indicated for blood  culture contaminants. CRITICAL RESULT CALLED TO, READ BACK BY AND VERIFIED WITH: E JACKSON PHARMD 02/06/20 0210 JDW    Staphylococcus lugdunensis NOT DETECTED NOT DETECTED Final   Streptococcus species NOT DETECTED NOT DETECTED Final   Streptococcus agalactiae NOT DETECTED NOT DETECTED Final   Streptococcus pneumoniae NOT DETECTED NOT DETECTED Final   Streptococcus pyogenes NOT DETECTED NOT DETECTED Final   A.calcoaceticus-baumannii NOT DETECTED NOT DETECTED Final   Bacteroides fragilis NOT DETECTED NOT DETECTED Final   Enterobacterales NOT DETECTED NOT DETECTED Final   Enterobacter cloacae complex NOT DETECTED NOT DETECTED Final   Escherichia coli NOT DETECTED NOT DETECTED Final   Klebsiella aerogenes NOT DETECTED NOT DETECTED Final   Klebsiella oxytoca NOT DETECTED NOT DETECTED Final   Klebsiella pneumoniae NOT DETECTED NOT DETECTED Final   Proteus species NOT DETECTED NOT DETECTED Final   Salmonella species NOT DETECTED NOT DETECTED Final   Serratia marcescens NOT DETECTED NOT DETECTED Final   Haemophilus influenzae NOT DETECTED NOT DETECTED Final   Neisseria meningitidis NOT DETECTED NOT DETECTED Final   Pseudomonas aeruginosa NOT DETECTED NOT DETECTED Final   Stenotrophomonas maltophilia NOT  DETECTED NOT DETECTED Final   Candida albicans NOT DETECTED NOT DETECTED Final   Candida auris NOT DETECTED NOT DETECTED Final   Candida glabrata NOT DETECTED NOT DETECTED Final   Candida krusei NOT DETECTED NOT DETECTED Final   Candida parapsilosis NOT DETECTED NOT DETECTED Final   Candida tropicalis NOT DETECTED NOT DETECTED Final   Cryptococcus neoformans/gattii NOT DETECTED NOT DETECTED Final   Methicillin resistance mecA/C DETECTED (A) NOT DETECTED Final    Comment: CRITICAL RESULT CALLED TO, READ BACK BY AND VERIFIED WITHSeleta Rhymes [facility] 02/06/20 0210 JDW Performed at [facility], [address] [place], [place] [address]   Culture, blood (routine x 2) Call MD if unable to obtain prior to antibiotics being given     Status: None   Collection Time: 02/04/20  7:10 AM   Specimen: BLOOD RIGHT HAND  Result Value Ref Range Status   Specimen Description   Final    BLOOD RIGHT HAND Performed at [place] [facility] [address] [place], [place] [address]    Special Requests   Final    BOTTLES DRAWN AEROBIC AND ANAEROBIC Blood Culture adequate volume Performed at [facility] [address], [place], [place] [address]    Culture   Final    NO GROWTH 5 DAYS Performed at [facility], [place] [address], [place], [place] [address]    Report Status 02/09/2020 FINAL  Final  MRSA PCR Screening     Status: None   Collection Time: 02/05/20  1:37 PM   Specimen: Nasal Mucosa; Nasopharyngeal  Result Value Ref Range Status   MRSA by PCR NEGATIVE NEGATIVE Final    Comment:        The GeneXpert MRSA Assay (FDA approved for NASAL specimens only), is one component of a comprehensive MRSA colonization surveillance program. It is not intended to diagnose MRSA infection nor to guide or monitor treatment for MRSA infections. Performed at [facility], [place] [address], [place], [place] [address]   SARS Coronavirus 2 by RT PCR  (hospital order, performed in [facility] hospital lab) Nasopharyngeal Nasopharyngeal Swab     Status: None   Collection Time: 02/08/20  4:25 PM   Specimen: Nasopharyngeal Swab  Result Value Ref Range Status   SARS Coronavirus 2 NEGATIVE NEGATIVE Final    Comment: (NOTE) SARS-CoV-2 target nucleic acids are NOT DETECTED.  The SARS-CoV-2 RNA is generally detectable in upper and lower respiratory specimens during the acute phase of infection. The lowest concentration of SARS-CoV-2 viral copies this assay can detect is 250 copies / mL. A negative result does not preclude SARS-CoV-2 infection and should not be used as the sole basis for treatment or other patient management decisions.  A negative result may occur with improper specimen collection / handling, submission of specimen other than nasopharyngeal swab, presence of viral mutation(s) within the areas targeted by this assay, and inadequate number of viral copies (<250 copies / mL). A negative result must be combined with clinical observations, patient history, and epidemiological information.  Fact Sheet for Patients:   StrictlyIdeas.no  Fact Sheet for Healthcare Providers: BankingDealers.co.za  This test is not yet approved or  cleared by the [place] FDA and has been authorized for detection and/or diagnosis of SARS-CoV-2 by FDA under an Emergency Use Authorization (EUA).  This EUA will remain in effect (meaning this test can be used) for the duration of the COVID-19 declaration under Section 564(b)(1) of the Act, 21 U.S.C. section 360bbb-3(b)(1), unless the authorization is terminated or revoked sooner.  Performed at [facility], [place] [address] [place], [place] [address]      Labs: BNP (last 3 results) Recent Labs    02/03/20 1812 02/04/20 2030  BNP 382.3* 950.9*   Basic Metabolic Panel: Recent Labs  Lab 02/04/20 2035 02/05/20 0417  02/06/20 0408 02/07/20 0321 02/08/20 0418  NA 133* 132* 137 136 135  K 3.5 3.5 3.0* 3.8 4.4  CL 100 101 105 105 97*  CO2 19* 22 22 24 28   GLUCOSE 106* 190* 146* 103* 89  BUN 40* 41* 37* 26* 23  CREATININE 1.70* 1.89* 1.54* 1.60* 1.62*  CALCIUM 8.5* 8.0* 8.4* 8.1* 8.6*   Liver Function Tests: Recent Labs  Lab 02/04/20 2035 02/05/20 0417 02/06/20 0408 02/07/20 0321 02/08/20 0418  AST 20 17 21 18  14*  ALT 18 16 20 19 17   ALKPHOS 261* 134* 120 105 111  BILITOT 0.9 0.6 0.5 0.3 0.5  PROT 6.8 5.7* 5.6* 5.5* 6.3*  ALBUMIN 2.8* 2.2* 2.2* 2.1* 2.5*   No results for input(s): LIPASE, AMYLASE in the last 168 hours. No results for input(s): AMMONIA in the last 168 hours. CBC: Recent Labs  Lab 02/03/20 1820 02/03/20 1820 02/04/20 0625 02/04/20 0625 02/04/20 2035 02/05/20 0417 02/06/20 0408 02/07/20 0321 02/08/20 0418  WBC 18.2*   < > 11.9*   < > 11.4* 27.6* 15.4* 11.5* 10.6*  NEUTROABS 16.4*  --  11.2*  --  10.4*  --   --   --   --   HGB 11.3*   < > 9.0*   < > 10.5* 8.4* 8.5* 8.9* 9.7*  HCT 35.8*   < > 28.0*   < > 33.2* 26.2* 27.2* 28.3* 30.8*  MCV 90.4   < > 89.2   < > 91.0 90.0 91.3 92.2 90.1  PLT 238   < > 187   < > 189 167 152 171 204   < > = values in this interval not displayed.   Cardiac Enzymes: Recent Labs  Lab 02/03/20 1820  CKTOTAL 60   BNP: Invalid input(s): POCBNP CBG: No results for input(s): GLUCAP in the last 168 hours. D-Dimer No results for input(s): DDIMER in the last 72 hours. Hgb A1c No results for input(s): HGBA1C in the last 72 hours. Lipid Profile No results for input(s): CHOL, HDL, LDLCALC, TRIG, CHOLHDL, LDLDIRECT in the last 72 hours. Thyroid function studies No results for input(s): TSH, T4TOTAL, T3FREE, THYROIDAB in the last 72 hours.  Invalid input(s): FREET3 Anemia work up No results for input(s): VITAMINB12, FOLATE, FERRITIN, TIBC, IRON, RETICCTPCT in the last 72 hours. Urinalysis    Component Value Date/Time   COLORURINE  AMBER (A) 02/03/2020 1812   APPEARANCEUR HAZY (A) 02/03/2020 1812   LABSPEC 1.020 02/03/2020 1812   PHURINE 5.0 02/03/2020 1812   GLUCOSEU NEGATIVE 02/03/2020 1812   HGBUR MODERATE (A) 02/03/2020 1812   BILIRUBINUR NEGATIVE 02/03/2020 1812   KETONESUR 5 (A) 02/03/2020 1812   PROTEINUR 100 (A) 02/03/2020 1812   NITRITE NEGATIVE 02/03/2020 1812   LEUKOCYTESUR TRACE (A) 02/03/2020 1812   Sepsis Labs Invalid input(s): PROCALCITONIN,  WBC,  LACTICIDVEN Microbiology Recent Results (from the past 240 hour(s))  Resp Panel by RT PCR (RSV, Flu A&B, Covid) - Nasopharyngeal Swab     Status: None   Collection Time: 02/03/20  6:25 PM   Specimen: Nasopharyngeal Swab  Result Value Ref Range Status   SARS Coronavirus 2 by RT PCR NEGATIVE NEGATIVE Final    Comment: (NOTE) SARS-CoV-2 target nucleic acids are NOT DETECTED.  The SARS-CoV-2 RNA is generally detectable in upper respiratoy specimens during the acute phase of infection. The lowest concentration of SARS-CoV-2 viral copies this assay can detect is 131 copies/mL. A negative result does not preclude SARS-Cov-2 infection and should not be used as the sole basis for treatment or other patient management decisions. A negative result may occur with  improper specimen collection/handling, submission of specimen other than nasopharyngeal swab, presence of viral mutation(s) within the areas targeted by this assay, and inadequate number of viral copies (<131 copies/mL). A negative result must be combined with clinical observations, patient history, and epidemiological information. The expected result is Negative.  Fact Sheet for Patients:  PinkCheek.be  Fact Sheet for Healthcare Providers:  GravelBags.it  This test is no t yet approved or cleared by the [place] FDA and  has been authorized for detection and/or diagnosis of SARS-CoV-2 by FDA under an Emergency Use Authorization  (EUA). This EUA will remain  in effect (meaning this test can be used) for the duration of the COVID-19 declaration under Section 564(b)(1) of the Act, 21 U.S.C. section 360bbb-3(b)(1), unless the authorization is terminated or revoked sooner.     Influenza A by PCR NEGATIVE NEGATIVE Final   Influenza B by PCR NEGATIVE NEGATIVE Final    Comment: (NOTE) The Xpert Xpress SARS-CoV-2/FLU/RSV assay is intended as  an aid in  the diagnosis of influenza from Nasopharyngeal swab specimens and  should not be used as a sole basis for treatment. Nasal washings and  aspirates are unacceptable for Xpert Xpress SARS-CoV-2/FLU/RSV  testing.  Fact Sheet for Patients: PinkCheek.be  Fact Sheet for Healthcare Providers: GravelBags.it  This test is not yet approved or cleared by the [place] FDA and  has been authorized for detection and/or diagnosis of SARS-CoV-2 by  FDA under an Emergency Use Authorization (EUA). This EUA will remain  in effect (meaning this test can be used) for the duration of the  Covid-19 declaration under Section 564(b)(1) of the Act, 21  U.S.C. section 360bbb-3(b)(1), unless the authorization is  terminated or revoked.    Respiratory Syncytial Virus by PCR NEGATIVE NEGATIVE Final    Comment: (NOTE) Fact Sheet for Patients: PinkCheek.be  Fact Sheet for Healthcare Providers: GravelBags.it  This test is not yet approved or cleared by the [place] FDA and  has been authorized for detection and/or diagnosis of SARS-CoV-2 by  FDA under an Emergency Use Authorization (EUA). This EUA will remain  in effect (meaning this test can be used) for the duration of the  COVID-19 declaration under Section 564(b)(1) of the Act, 21 U.S.C.  section 360bbb-3(b)(1), unless the authorization is terminated or  revoked. Performed at [facility], [place] [address], [place], [place] [address]   Culture, blood (routine x 2) Call MD if unable to obtain prior to antibiotics being given     Status: Abnormal   Collection Time: 02/04/20  6:45 AM   Specimen: BLOOD  Result Value Ref Range Status   Specimen Description   Final    BLOOD LEFT ANTECUBITAL Performed at [employer] [address] [place], [place] [address]    Special Requests   Final    BOTTLES DRAWN AEROBIC AND ANAEROBIC Blood Culture adequate volume Performed at [place] [address], [place], [place] [address]    Culture  Setup Time   Final    AEROBIC BOTTLE ONLY GRAM POSITIVE COCCI Organism ID to follow CRITICAL RESULT CALLED TO, READ BACK BY AND VERIFIED WITH: E JACKSON PHARMD 02/06/20 0210 JDW    Culture (A)  Final    STAPHYLOCOCCUS EPIDERMIDIS THE SIGNIFICANCE OF ISOLATING THIS ORGANISM FROM A SINGLE SET OF BLOOD CULTURES WHEN MULTIPLE SETS ARE DRAWN IS UNCERTAIN. PLEASE NOTIFY THE MICROBIOLOGY DEPARTMENT WITHIN ONE WEEK IF SPECIATION AND SENSITIVITIES ARE REQUIRED. Performed at [facility], [place] [address], [place],  [address]    Report Status 02/06/2020 FINAL  Final  Blood Culture ID Panel (Reflexed)     Status: Abnormal   Collection Time: 02/04/20  6:45 AM  Result Value Ref Range Status   Enterococcus faecalis NOT DETECTED NOT DETECTED Final   Enterococcus Faecium NOT DETECTED NOT DETECTED Final   Listeria monocytogenes NOT DETECTED NOT DETECTED Final   Staphylococcus species DETECTED (A) NOT DETECTED Final    Comment: CRITICAL RESULT CALLED TO, READ BACK BY AND VERIFIED WITH: E JACKSON PHARMD 02/06/20 0210 JDW    Staphylococcus aureus (BCID) NOT DETECTED NOT DETECTED Final   Staphylococcus epidermidis DETECTED (A) NOT DETECTED Final    Comment: Methicillin (oxacillin) resistant coagulase negative staphylococcus. Possible blood culture contaminant (unless isolated from more than one blood culture draw or  clinical case suggests pathogenicity). No antibiotic treatment is indicated for blood  culture contaminants. CRITICAL RESULT CALLED TO, READ BACK BY AND VERIFIED WITH: E  JACKSON [facility] 02/06/20 0210 JDW    Staphylococcus lugdunensis NOT DETECTED NOT DETECTED Final   Streptococcus species NOT DETECTED NOT DETECTED Final   Streptococcus agalactiae NOT DETECTED NOT DETECTED Final   Streptococcus pneumoniae NOT DETECTED NOT DETECTED Final   Streptococcus pyogenes NOT DETECTED NOT DETECTED Final   A.calcoaceticus-baumannii NOT DETECTED NOT DETECTED Final   Bacteroides fragilis NOT DETECTED NOT DETECTED Final   Enterobacterales NOT DETECTED NOT DETECTED Final   Enterobacter cloacae complex NOT DETECTED NOT DETECTED Final   Escherichia coli NOT DETECTED NOT DETECTED Final   Klebsiella aerogenes NOT DETECTED NOT DETECTED Final   Klebsiella oxytoca NOT DETECTED NOT DETECTED Final   Klebsiella pneumoniae NOT DETECTED NOT DETECTED Final   Proteus species NOT DETECTED NOT DETECTED Final   Salmonella species NOT DETECTED NOT DETECTED Final   Serratia marcescens NOT DETECTED NOT DETECTED Final   Haemophilus influenzae NOT DETECTED NOT DETECTED Final   Neisseria meningitidis NOT DETECTED NOT DETECTED Final   Pseudomonas aeruginosa NOT DETECTED NOT DETECTED Final   Stenotrophomonas maltophilia NOT DETECTED NOT DETECTED Final   Candida albicans NOT DETECTED NOT DETECTED Final   Candida auris NOT DETECTED NOT DETECTED Final   Candida glabrata NOT DETECTED NOT DETECTED Final   Candida krusei NOT DETECTED NOT DETECTED Final   Candida parapsilosis NOT DETECTED NOT DETECTED Final   Candida tropicalis NOT DETECTED NOT DETECTED Final   Cryptococcus neoformans/gattii NOT DETECTED NOT DETECTED Final   Methicillin resistance mecA/C DETECTED (A) NOT DETECTED Final    Comment: CRITICAL RESULT CALLED TO, READ BACK BY AND VERIFIED WITHSeleta Rhymes [facility] 02/06/20 0210 JDW Performed at [facility],  [address] [place], [place] [address]   Culture, blood (routine x 2) Call MD if unable to obtain prior to antibiotics being given     Status: None   Collection Time: 02/04/20  7:10 AM   Specimen: BLOOD RIGHT HAND  Result Value Ref Range Status   Specimen Description   Final    BLOOD RIGHT HAND Performed at [place] [address], [place], [place] [address]    Special Requests   Final    BOTTLES DRAWN AEROBIC AND ANAEROBIC Blood Culture adequate volume Performed at [place] [address] [place], [place] [address]    Culture   Final    NO GROWTH 5 DAYS Performed at [facility], [place] [address], [place], [place] [address]    Report Status 02/09/2020 FINAL  Final  MRSA PCR Screening     Status: None   Collection Time: 02/05/20  1:37 PM   Specimen: Nasal Mucosa; Nasopharyngeal  Result Value Ref Range Status   MRSA by PCR NEGATIVE NEGATIVE Final    Comment:        The GeneXpert MRSA Assay (FDA approved for NASAL specimens only), is one component of a comprehensive MRSA colonization surveillance program. It is not intended to diagnose MRSA infection nor to guide or monitor treatment for MRSA infections. Performed at [facility], [place] [address], [place],  [address]   SARS Coronavirus 2 by RT PCR (hospital order, performed in [facility] hospital lab) Nasopharyngeal Nasopharyngeal Swab     Status: None   Collection Time: 02/08/20  4:25 PM   Specimen: Nasopharyngeal Swab  Result Value Ref Range Status   SARS Coronavirus 2 NEGATIVE NEGATIVE Final    Comment: (NOTE) SARS-CoV-2 target nucleic acids are NOT DETECTED.  The SARS-CoV-2 RNA is generally  detectable in upper and lower respiratory specimens during the acute phase of infection. The lowest concentration of SARS-CoV-2 viral copies this assay can detect is 250 copies / mL. A negative result does not preclude SARS-CoV-2 infection and should not  be used as the sole basis for treatment or other patient management decisions.  A negative result may occur with improper specimen collection / handling, submission of specimen other than nasopharyngeal swab, presence of viral mutation(s) within the areas targeted by this assay, and inadequate number of viral copies (<250 copies / mL). A negative result must be combined with clinical observations, patient history, and epidemiological information.  Fact Sheet for Patients:   StrictlyIdeas.no  Fact Sheet for Healthcare Providers: BankingDealers.co.za  This test is not yet approved or  cleared by the [place] FDA and has been authorized for detection and/or diagnosis of SARS-CoV-2 by FDA under an Emergency Use Authorization (EUA).  This EUA will remain in effect (meaning this test can be used) for the duration of the COVID-19 declaration under Section 564(b)(1) of the Act, 21 U.S.C. section 360bbb-3(b)(1), unless the authorization is terminated or revoked sooner.  Performed at [facility], [place] [address] [place], [place] [address]      Time coordinating discharge:  39 minutes  SIGNED:   Georgette Shell, MD  [facility] 02/09/2020, 10:09 AM

## 2020-02-09 NOTE — Progress Notes (Signed)
PROGRESS NOTE    Kathleen Schneider  OIN:[id number] DOB: [birth year] DOA: 02/03/2020 PCP: Donald Prose, MD   Brief Narrative:79 y.o. female with medical history significant of hypertension, recurrent UTI, chronic kidney disease stage III, excessive daytime sleepiness, gait abnormalities, visual changes, who came in from home with shortness of breath and weakness.  Patient noticed progressive shortness of breath over the last 3 to 4 days.  Associated with some cough.  She felt very weak.  No fever or chills.  She has been weak that she has been mostly in bed.  She came to the ER where she was evaluated.  Patient was noted to be mildly hypoxic.  She also requires about 2 L of oxygen now.  She is COVID-19 negative but has evidence of pneumonia versus CHF.  Her BNP is elevated but also has leukocytosis and infiltrates that could be technically speaking and pneumonia.  She is got elevated creatinine which is slightly above her baseline.  She is therefore being admitted to the hospital for acute respiratory failure probably pneumonia but could be CHF as well..  ED Course: Temperature 98.1 blood pressure 130/59 pulse 108 respiratory rate of 24 oxygen sats 93% on 2 L.  White count is 18.2, hemoglobin 11.3, platelets 238 sodium 132 potassium 3.2 chloride 96 CO2 22 BUN 37 creatinine 2.05 and calcium 8.5.  Glucose 139.  COVID-19 screen is negative.  Urinalysis showed many bacteria with WBC 21-50 but negative leukocytes and nitrite.  Probably asymptomatic bacteriuria.  Chest x-ray showed left basilar opacity consistent with atelectasis versus infiltrate.  BNP is 382.  Patient being admitted with pneumonia as well as suspected cardiac cause.   Assessment & Plan:   Principal Problem:   Acute respiratory failure with hypoxia (HCC) Active Problems:   Spinal stenosis, lumbar   ARF (acute renal failure) (HCC)   Hyponatremia   Leucocytosis   Hypokalemia   Elevated brain natriuretic peptide (BNP) level   Sepsis  ([place])  #1  Sepsis present on admission as evidenced by lactic acidosis secondary to community-acquired pneumonia-  she was tachypneic tachycardic and febrile at 103.1 with lactic acidosis and leukocytosis.  Lactic acid level was 2.2 down to 1.1 Chest x-ray with left basilar opacity consistent with atelectasis or infiltrates. She was treated with Rocephin and azithromycin. Follow-up blood cultures-staph epidermidis. Blood pressure improved with IV fluids. Influenza negative Covid negative. Leukocytosis resolved. MRSA PCR negative.  Patient became hypoxic on 02/08/2020 requiring 6 L of oxygen.  She received multiple doses of IV Lasix.  Today she has improved 97% on 2 L.  She is awake alert not in any respiratory distress.  #2 acute hypoxic respiratory failure secondary to CAP continue Rocephin azithromycin.  Patient was not on oxygen prior to admission to hospital.  She became hypoxic requiring 6 L of oxygen to maintain her sats above 90%.  Currently down to 2 L saturating 97%.  We will titrate oxygen down to keep sats above 92%.   #2 mild hyponatremia -sodium 137. Resolved.   #3 AKI on CKD stage IIIb -creatinine 1.60 DC IV fluids.  #4 elevated BNP?  This is likely related to AKI or obesity.  Echo 02/05/2020 with ejection fraction 65 to 70% with normal LV function no regional wall motion abnormalities.  Grade 1 diastolic dysfunction.  #5  Mildly elevated troponin 61 likely due to demand ischemia from #1 and CKD  #6 deconditioning patient lives alone seen by physical therapy recommending SNF Case manager aware.  #7  Bone  scan metastatic lesion involving the proximal right femur.  Scattered osseous mets.  I will consult palliative care.  Patient has no family.  I have tried to call the numbers available to reach her friend's with no access.  I will hold off on pursuing metastatic work-up.  #8 disposition-patient lives alone she has cognitive deficit and possible dementia.  It is not clear how  much she can comprehend.     Estimated body mass index is 36.25 kg/m as calculated from the following:   Height as of this encounter: 5' (1.524 m).   Weight as of this encounter: 84.2 kg.  DVT prophylaxis: Lovenox  code Status: Full code  family Communication: None at bedside  disposition Plan:  Status is: Inpatient  Dispo: The patient is from: Home              Anticipated d/c is to: Skilled nursing facility              Anticipated d/c date is: 2 days              Patient currently is not medically stable to d/c.    Consultants:   None  Procedures: None Antimicrobials: Rocephin and azithromycin  Subjective: Patient resting in bed eating breakfast she had the TV remote on her chest and she is asking me to find it to watch TV  She reports she has no family but has some friends  objective: Vitals:   02/08/20 1317 02/08/20 1942 02/09/20 0338 02/09/20 1418  BP: 115/63 116/65 119/70 115/61  Pulse: 73 71 71 76  Resp: 16 (!) 21 15 16   Temp: 98 F (36.7 C) 98 F (36.7 C) 98 F (36.7 C) 98.1 F (36.7 C)  TempSrc: Oral Oral  Oral  SpO2: 99% 97% 100% 99%  Weight:      Height:        Intake/Output Summary (Last 24 hours) at 02/09/2020 1629 Last data filed at 02/09/2020 1400 Gross per 24 hour  Intake 30 ml  Output 1200 ml  Net -1170 ml   Filed Weights   02/04/20 1300  Weight: 84.2 kg    Examination:  General exam: Appears calm and comfortable  Respiratory system: Coarse breath sounds bases to auscultation.  Diminished breath sounds at the bases respiratory effort normal. Cardiovascular system: S1 & S2 heard, RRR. No JVD, murmurs, rubs, gallops or clicks. No pedal edema. Gastrointestinal system: Abdomen is nondistended, soft and nontender. No organomegaly or masses felt. Normal bowel sounds heard. Central nervous system: Alert and oriented. No focal neurological deficits. Extremities: Trace bilateral pitting edema skin: No rashes, lesions or ulcers Psychiatry:  Judgement and insight appear normal. Mood & affect appropriate.     Data Reviewed: I have personally reviewed following labs and imaging studies  CBC: Recent Labs  Lab 02/03/20 1820 02/03/20 1820 02/04/20 8768 02/04/20 1157 02/04/20 2035 02/05/20 0417 02/06/20 0408 02/07/20 0321 02/08/20 0418  WBC 18.2*   < > 11.9*   < > 11.4* 27.6* 15.4* 11.5* 10.6*  NEUTROABS 16.4*  --  11.2*  --  10.4*  --   --   --   --   HGB 11.3*   < > 9.0*   < > 10.5* 8.4* 8.5* 8.9* 9.7*  HCT 35.8*   < > 28.0*   < > 33.2* 26.2* 27.2* 28.3* 30.8*  MCV 90.4   < > 89.2   < > 91.0 90.0 91.3 92.2 90.1  PLT 238   < > 187   < >  189 167 152 171 204   < > = values in this interval not displayed.   Basic Metabolic Panel: Recent Labs  Lab 02/04/20 2035 02/05/20 0417 02/06/20 0408 02/07/20 0321 02/08/20 0418  NA 133* 132* 137 136 135  K 3.5 3.5 3.0* 3.8 4.4  CL 100 101 105 105 97*  CO2 19* 22 22 24 28   GLUCOSE 106* 190* 146* 103* 89  BUN 40* 41* 37* 26* 23  CREATININE 1.70* 1.89* 1.54* 1.60* 1.62*  CALCIUM 8.5* 8.0* 8.4* 8.1* 8.6*   GFR: Estimated Creatinine Clearance: 26.7 mL/min (A) (by C-G formula based on SCr of 1.62 mg/dL (H)). Liver Function Tests: Recent Labs  Lab 02/04/20 2035 02/05/20 0417 02/06/20 0408 02/07/20 0321 02/08/20 0418  AST 20 17 21 18  14*  ALT 18 16 20 19 17   ALKPHOS 261* 134* 120 105 111  BILITOT 0.9 0.6 0.5 0.3 0.5  PROT 6.8 5.7* 5.6* 5.5* 6.3*  ALBUMIN 2.8* 2.2* 2.2* 2.1* 2.5*   No results for input(s): LIPASE, AMYLASE in the last 168 hours. No results for input(s): AMMONIA in the last 168 hours. Coagulation Profile: No results for input(s): INR, PROTIME in the last 168 hours. Cardiac Enzymes: Recent Labs  Lab 02/03/20 1820  CKTOTAL 60   BNP (last 3 results) No results for input(s): PROBNP in the last 8760 hours. HbA1C: No results for input(s): HGBA1C in the last 72 hours. CBG: No results for input(s): GLUCAP in the last 168 hours. Lipid Profile: No  results for input(s): CHOL, HDL, LDLCALC, TRIG, CHOLHDL, LDLDIRECT in the last 72 hours. Thyroid Function Tests: No results for input(s): TSH, T4TOTAL, FREET4, T3FREE, THYROIDAB in the last 72 hours. Anemia Panel: No results for input(s): VITAMINB12, FOLATE, FERRITIN, TIBC, IRON, RETICCTPCT in the last 72 hours. Sepsis Labs: Recent Labs  Lab 02/04/20 1533 02/04/20 1749 02/04/20 2035 02/04/20 2327  LATICACIDVEN 1.4 1.3 2.2* 1.1    Recent Results (from the past 240 hour(s))  Resp Panel by RT PCR (RSV, Flu A&B, Covid) - Nasopharyngeal Swab     Status: None   Collection Time: 02/03/20  6:25 PM   Specimen: Nasopharyngeal Swab  Result Value Ref Range Status   SARS Coronavirus 2 by RT PCR NEGATIVE NEGATIVE Final    Comment: (NOTE) SARS-CoV-2 target nucleic acids are NOT DETECTED.  The SARS-CoV-2 RNA is generally detectable in upper respiratoy specimens during the acute phase of infection. The lowest concentration of SARS-CoV-2 viral copies this assay can detect is 131 copies/mL. A negative result does not preclude SARS-Cov-2 infection and should not be used as the sole basis for treatment or other patient management decisions. A negative result may occur with  improper specimen collection/handling, submission of specimen other than nasopharyngeal swab, presence of viral mutation(s) within the areas targeted by this assay, and inadequate number of viral copies (<131 copies/mL). A negative result must be combined with clinical observations, patient history, and epidemiological information. The expected result is Negative.  Fact Sheet for Patients:  PinkCheek.be  Fact Sheet for Healthcare Providers:  GravelBags.it  This test is no t yet approved or cleared by the [place] FDA and  has been authorized for detection and/or diagnosis of SARS-CoV-2 by FDA under an Emergency Use Authorization (EUA). This EUA will remain  in  effect (meaning this test can be used) for the duration of the COVID-19 declaration under Section 564(b)(1) of the Act, 21 U.S.C. section 360bbb-3(b)(1), unless the authorization is terminated or revoked sooner.     Influenza  A by PCR NEGATIVE NEGATIVE Final   Influenza B by PCR NEGATIVE NEGATIVE Final    Comment: (NOTE) The Xpert Xpress SARS-CoV-2/FLU/RSV assay is intended as an aid in  the diagnosis of influenza from Nasopharyngeal swab specimens and  should not be used as a sole basis for treatment. Nasal washings and  aspirates are unacceptable for Xpert Xpress SARS-CoV-2/FLU/RSV  testing.  Fact Sheet for Patients: PinkCheek.be  Fact Sheet for Healthcare Providers: GravelBags.it  This test is not yet approved or cleared by the [place] FDA and  has been authorized for detection and/or diagnosis of SARS-CoV-2 by  FDA under an Emergency Use Authorization (EUA). This EUA will remain  in effect (meaning this test can be used) for the duration of the  Covid-19 declaration under Section 564(b)(1) of the Act, 21  U.S.C. section 360bbb-3(b)(1), unless the authorization is  terminated or revoked.    Respiratory Syncytial Virus by PCR NEGATIVE NEGATIVE Final    Comment: (NOTE) Fact Sheet for Patients: PinkCheek.be  Fact Sheet for Healthcare Providers: GravelBags.it  This test is not yet approved or cleared by the [place] FDA and  has been authorized for detection and/or diagnosis of SARS-CoV-2 by  FDA under an Emergency Use Authorization (EUA). This EUA will remain  in effect (meaning this test can be used) for the duration of the  COVID-19 declaration under Section 564(b)(1) of the Act, 21 U.S.C.  section 360bbb-3(b)(1), unless the authorization is terminated or  revoked. Performed at [facility], [place] [address], [place], [place]  [address]   Culture, blood (routine x 2) Call MD if unable to obtain prior to antibiotics being given     Status: Abnormal   Collection Time: 02/04/20  6:45 AM   Specimen: BLOOD  Result Value Ref Range Status   Specimen Description   Final    BLOOD LEFT ANTECUBITAL Performed at [facility] [address], [place], [place] [address]    Special Requests   Final    BOTTLES DRAWN AEROBIC AND ANAEROBIC Blood Culture adequate volume Performed at [facility] [address], [place], [place] [address]    Culture  Setup Time   Final    AEROBIC BOTTLE ONLY GRAM POSITIVE COCCI Organism ID to follow CRITICAL RESULT CALLED TO, READ BACK BY AND VERIFIED WITH: E JACKSON PHARMD 02/06/20 0210 JDW    Culture (A)  Final    STAPHYLOCOCCUS EPIDERMIDIS THE SIGNIFICANCE OF ISOLATING THIS ORGANISM FROM A SINGLE SET OF BLOOD CULTURES WHEN MULTIPLE SETS ARE DRAWN IS UNCERTAIN. PLEASE NOTIFY THE MICROBIOLOGY DEPARTMENT WITHIN ONE WEEK IF SPECIATION AND SENSITIVITIES ARE REQUIRED. Performed at [facility], [place] [address], [place],  [address]    Report Status 02/06/2020 FINAL  Final  Blood Culture ID Panel (Reflexed)     Status: Abnormal   Collection Time: 02/04/20  6:45 AM  Result Value Ref Range Status   Enterococcus faecalis NOT DETECTED NOT DETECTED Final   Enterococcus Faecium NOT DETECTED NOT DETECTED Final   Listeria monocytogenes NOT DETECTED NOT DETECTED Final   Staphylococcus species DETECTED (A) NOT DETECTED Final    Comment: CRITICAL RESULT CALLED TO, READ BACK BY AND VERIFIED WITH: E JACKSON PHARMD 02/06/20 0210 JDW    Staphylococcus aureus (BCID) NOT DETECTED NOT DETECTED Final   Staphylococcus epidermidis DETECTED (A) NOT DETECTED Final    Comment: Methicillin (oxacillin) resistant coagulase negative staphylococcus. Possible blood culture contaminant (unless isolated from more than one blood culture  draw or clinical case suggests  pathogenicity). No antibiotic treatment is indicated for blood  culture contaminants. CRITICAL RESULT CALLED TO, READ BACK BY AND VERIFIED WITH: E JACKSON PHARMD 02/06/20 0210 JDW    Staphylococcus lugdunensis NOT DETECTED NOT DETECTED Final   Streptococcus species NOT DETECTED NOT DETECTED Final   Streptococcus agalactiae NOT DETECTED NOT DETECTED Final   Streptococcus pneumoniae NOT DETECTED NOT DETECTED Final   Streptococcus pyogenes NOT DETECTED NOT DETECTED Final   A.calcoaceticus-baumannii NOT DETECTED NOT DETECTED Final   Bacteroides fragilis NOT DETECTED NOT DETECTED Final   Enterobacterales NOT DETECTED NOT DETECTED Final   Enterobacter cloacae complex NOT DETECTED NOT DETECTED Final   Escherichia coli NOT DETECTED NOT DETECTED Final   Klebsiella aerogenes NOT DETECTED NOT DETECTED Final   Klebsiella oxytoca NOT DETECTED NOT DETECTED Final   Klebsiella pneumoniae NOT DETECTED NOT DETECTED Final   Proteus species NOT DETECTED NOT DETECTED Final   Salmonella species NOT DETECTED NOT DETECTED Final   Serratia marcescens NOT DETECTED NOT DETECTED Final   Haemophilus influenzae NOT DETECTED NOT DETECTED Final   Neisseria meningitidis NOT DETECTED NOT DETECTED Final   Pseudomonas aeruginosa NOT DETECTED NOT DETECTED Final   Stenotrophomonas maltophilia NOT DETECTED NOT DETECTED Final   Candida albicans NOT DETECTED NOT DETECTED Final   Candida auris NOT DETECTED NOT DETECTED Final   Candida glabrata NOT DETECTED NOT DETECTED Final   Candida krusei NOT DETECTED NOT DETECTED Final   Candida parapsilosis NOT DETECTED NOT DETECTED Final   Candida tropicalis NOT DETECTED NOT DETECTED Final   Cryptococcus neoformans/gattii NOT DETECTED NOT DETECTED Final   Methicillin resistance mecA/C DETECTED (A) NOT DETECTED Final    Comment: CRITICAL RESULT CALLED TO, READ BACK BY AND VERIFIED WITHSeleta Rhymes [facility] 02/06/20 0210 JDW Performed at [facility], [address],  [place], [place] [address]   Culture, blood (routine x 2) Call MD if unable to obtain prior to antibiotics being given     Status: None   Collection Time: 02/04/20  7:10 AM   Specimen: BLOOD RIGHT HAND  Result Value Ref Range Status   Specimen Description   Final    BLOOD RIGHT HAND Performed at [facility] [address] [place], [place] [address]    Special Requests   Final    BOTTLES DRAWN AEROBIC AND ANAEROBIC Blood Culture adequate volume Performed at [facility] [address] [place], [place] [address]    Culture   Final    NO GROWTH 5 DAYS Performed at [facility], [place] [address], [place], [place] [address]    Report Status 02/09/2020 FINAL  Final  MRSA PCR Screening     Status: None   Collection Time: 02/05/20  1:37 PM   Specimen: Nasal Mucosa; Nasopharyngeal  Result Value Ref Range Status   MRSA by PCR NEGATIVE NEGATIVE Final    Comment:        The GeneXpert MRSA Assay (FDA approved for NASAL specimens only), is one component of a comprehensive MRSA colonization surveillance program. It is not intended to diagnose MRSA infection nor to guide or monitor treatment for MRSA infections. Performed at [facility], [place] [address], [place], [place] [address]   SARS Coronavirus 2 by RT PCR (hospital order, performed in [facility] hospital lab) Nasopharyngeal Nasopharyngeal Swab     Status: None   Collection Time: 02/08/20  4:25 PM   Specimen: Nasopharyngeal Swab  Result Value Ref  Range Status   SARS Coronavirus 2 NEGATIVE NEGATIVE Final    Comment: (NOTE) SARS-CoV-2 target nucleic acids are NOT DETECTED.  The SARS-CoV-2 RNA is generally detectable in upper and lower respiratory specimens during the acute phase of infection. The lowest concentration of SARS-CoV-2 viral copies this assay can detect is 250 copies / mL. A negative result does not preclude SARS-CoV-2 infection and should not be used as the  sole basis for treatment or other patient management decisions.  A negative result may occur with improper specimen collection / handling, submission of specimen other than nasopharyngeal swab, presence of viral mutation(s) within the areas targeted by this assay, and inadequate number of viral copies (<250 copies / mL). A negative result must be combined with clinical observations, patient history, and epidemiological information.  Fact Sheet for Patients:   StrictlyIdeas.no  Fact Sheet for Healthcare Providers: BankingDealers.co.za  This test is not yet approved or  cleared by the [place] FDA and has been authorized for detection and/or diagnosis of SARS-CoV-2 by FDA under an Emergency Use Authorization (EUA).  This EUA will remain in effect (meaning this test can be used) for the duration of the COVID-19 declaration under Section 564(b)(1) of the Act, 21 U.S.C. section 360bbb-3(b)(1), unless the authorization is terminated or revoked sooner.  Performed at [facility], [place] [address] [place], [place] [address]          Radiology Studies: NM Bone Scan Whole Body  Result Date: 02/09/2020 CLINICAL DATA:  Metastatic disease evaluation, sclerotic foci on pelvic radiographs several falls at home, BILATERAL leg pain, history hypertension EXAM: NUCLEAR MEDICINE WHOLE BODY BONE SCAN TECHNIQUE: Whole body anterior and posterior images were obtained approximately 3 hours after intravenous injection of radiopharmaceutical. RADIOPHARMACEUTICALS:  21.2 mCi Technetium-58m MDP IV COMPARISON:  None Radiographic correlation: LEFT hip and pelvic radiographs 02/07/2020 FINDINGS: Multiple foci of abnormal osseous tracer accumulation consistent with osseous metastases. These include calvarium, sternum, posterior ribs, thoracic and lumbar spine, pelvis, and proximal RIGHT femur. Question subtle metastatic lesion at mid RIGHT  femoral diaphysis. Fairly symmetric uptake is seen at the humeral heads, proximal femora, knees, and ankles, nonspecific but symmetry could reflect marrow reconversion. Expected urinary tract and soft tissue distribution of tracer. IMPRESSION: Scattered osseous metastases. Metastatic lesion involving proximal RIGHT femur and questionably mid RIGHT femoral diaphysis. Electronically Signed   By: Lavonia Dana M.D.   On: 02/09/2020 14:38        Scheduled Meds: . enoxaparin (LOVENOX) injection  30 mg Subcutaneous Q24H  . escitalopram  5 mg Oral Daily  . lamoTRIgine  150 mg Oral BID  . lidocaine  2 patch Transdermal Q24H  . oxybutynin  5 mg Oral BID   Continuous Infusions: . sodium chloride       LOS: 6 days   Georgette Shell, MD 02/09/2020, 4:29 PM

## 2020-02-10 ENCOUNTER — Inpatient Hospital Stay (HOSPITAL_COMMUNITY): Payer: PPO

## 2020-02-10 ENCOUNTER — Encounter (HOSPITAL_COMMUNITY): Payer: Self-pay | Admitting: Internal Medicine

## 2020-02-10 DIAGNOSIS — R7989 Other specified abnormal findings of blood chemistry: Secondary | ICD-10-CM | POA: Diagnosis not present

## 2020-02-10 DIAGNOSIS — E876 Hypokalemia: Secondary | ICD-10-CM | POA: Diagnosis not present

## 2020-02-10 DIAGNOSIS — E871 Hypo-osmolality and hyponatremia: Secondary | ICD-10-CM

## 2020-02-10 DIAGNOSIS — J9601 Acute respiratory failure with hypoxia: Secondary | ICD-10-CM

## 2020-02-10 DIAGNOSIS — N179 Acute kidney failure, unspecified: Secondary | ICD-10-CM

## 2020-02-10 DIAGNOSIS — M48061 Spinal stenosis, lumbar region without neurogenic claudication: Secondary | ICD-10-CM

## 2020-02-10 LAB — CREATININE, SERUM
Creatinine, Ser: 1.45 mg/dL — ABNORMAL HIGH (ref 0.44–1.00)
GFR, Estimated: 36 mL/min — ABNORMAL LOW (ref >60)

## 2020-02-10 MED ORDER — IOHEXOL 9 MG/ML PO SOLN
500.0000 mL | ORAL | Status: AC
  Administered 2020-02-10: 500 mL via ORAL

## 2020-02-10 MED ORDER — IOHEXOL 300 MG/ML  SOLN
75.0000 mL | Freq: Once | INTRAMUSCULAR | Status: AC | PRN
  Administered 2020-02-10: 75 mL via INTRAVENOUS

## 2020-02-10 MED ORDER — IOHEXOL 9 MG/ML PO SOLN
ORAL | Status: AC
  Administered 2020-02-10: 500 mL
  Filled 2020-02-10: qty 1000

## 2020-02-10 NOTE — Progress Notes (Signed)
PROGRESS NOTE    Kathleen Schneider  TDD:[id number] DOB: July 01, 1940 DOA: 02/03/2020 PCP: Donald Prose, MD   Brief Narrative:79 y.o. female with medical history significant of hypertension, recurrent UTI, chronic kidney disease stage III, excessive daytime sleepiness, gait abnormalities, visual changes, who came in from home with shortness of breath and weakness.  Patient noticed progressive shortness of breath over the last 3 to 4 days.  Associated with some cough.  She felt very weak.  No fever or chills.  She has been weak that she has been mostly in bed.  She came to the ER where she was evaluated.  Patient was noted to be mildly hypoxic.  She also requires about 2 L of oxygen now.  She is COVID-19 negative but has evidence of pneumonia versus CHF.  Her BNP is elevated but also has leukocytosis and infiltrates that could be technically speaking and pneumonia.  She is got elevated creatinine which is slightly above her baseline.  She is therefore being admitted to the hospital for acute respiratory failure probably pneumonia but could be CHF as well..  ED Course: Temperature 98.1 blood pressure 130/59 pulse 108 respiratory rate of 24 oxygen sats 93% on 2 L.  White count is 18.2, hemoglobin 11.3, platelets 238 sodium 132 potassium 3.2 chloride 96 CO2 22 BUN 37 creatinine 2.05 and calcium 8.5.  Glucose 139.  COVID-19 screen is negative.  Urinalysis showed many bacteria with WBC 21-50 but negative leukocytes and nitrite.  Probably asymptomatic bacteriuria.  Chest x-ray showed left basilar opacity consistent with atelectasis versus infiltrate.  BNP is 382.  Patient being admitted with pneumonia as well as suspected cardiac cause.   Assessment & Plan:   Principal Problem:   Acute respiratory failure with hypoxia (HCC) Active Problems:   Spinal stenosis, lumbar   ARF (acute renal failure) (HCC)   Hyponatremia   Leucocytosis   Hypokalemia   Elevated brain natriuretic peptide (BNP) level   Sepsis  (HCC)  Sepsis present on admission due to CAP: Since resolved. As evidenced by lactic acidosis secondary to community-acquired pneumonia-  she was tachypneic tachycardic and febrile at 103.1 with lactic acidosis and leukocytosis.  Lactic acid level was 2.2 down to 1.1 Chest x-ray with left basilar opacity consistent with atelectasis or infiltrates. She was treated with Rocephin and azithromycin. Follow-up blood cultures-staph epidermidis thought to be a contaminant. Blood pressure improved with IV fluids. Influenza negative Covid negative. Leukocytosis resolved. MRSA PCR negative.  Patient became hypoxic on 02/08/2020 requiring 6 L of oxygen.  She received multiple doses of IV Lasix.  Today she has improved 97% on 2 L.  She is awake alert not in any respiratory distress.  Widespread sclerotic bony lesions: By patient's demographics, most likely cause is breast CA mets.  - D/w Dr. Alvy Bimler, oncology, who will evaluate the patient. I appreciate assistance.   Acute hypoxic respiratory failure secondary to CAP:  - Completed ceftriaxone, azithromycin x5 days with improvement in leukocytosis. - Wean oxygen to room air. If unable to do so, will need repeat CXR.    Hyponatremia: Resolved.   AKI on CKD stage IIIb: Improved.  Elevated BNP: Echo 02/05/2020 with ejection fraction 65 to 70% with normal LV function no regional wall motion abnormalities.  Grade 1 diastolic dysfunction. IVC normal caliber and phasicity.  - Monitor volume status clinically.   Demand ischemia: Mildly elevated troponin without anginal complaints.  - Recommend outpatient ischemic evaluation.  Deconditioning, frequent falls: Patient had successfully rehabilitated at [facility] previously and  requires rehabilitation now though this has been declined by insurance. She is planning on discharging home. I discussed with CSW who will arrange maximal support though this patient is at very high risk of readmission.  Obesity:  Estimated body mass index is 36.25 kg/m as calculated from the following:   Height as of this encounter: 5' (1.524 m).   Weight as of this encounter: 84.2 kg.  DVT prophylaxis: Lovenox  Code Status: Full code  Family Communication: None at bedside  Disposition Plan:  Status is: Inpatient  Dispo: The patient is from: Home              Anticipated d/c is to: Home with maximal support arranged by CSW.               Anticipated d/c date is: 2 days              Patient currently is not medically stable to d/c. Requires further evaluation of newly diagnosed widespread sclerotic lesions.   Consultants:   Oncology, Dr. Alvy Bimler  Procedures: None Antimicrobials: Rocephin and azithromycin  Subjective: No shortness of breath but still on oxygen. No bony pain. No chest pain or abd pain, tolerating po. Tearful frequently when discussing possibility of discharge. Has no support at home, her only contact is a friend whose husband is getting knee replacement surgery tomorrow.   objective: Vitals:   02/09/20 1418 02/09/20 1925 02/10/20 0353 02/10/20 1357  BP: 115/61 (!) 104/53 124/65 103/60  Pulse: 76 73 69 77  Resp: 16 18 15 19   Temp: 98.1 F (36.7 C) 97.6 F (36.4 C) 97.8 F (36.6 C) 99.1 F (37.3 C)  TempSrc: Oral Oral Other (Comment)   SpO2: 99% 94% 98% 96%  Weight:      Height:        Intake/Output Summary (Last 24 hours) at 02/10/2020 1432 Last data filed at 02/10/2020 1020 Gross per 24 hour  Intake 60 ml  Output 600 ml  Net -540 ml   Filed Weights   02/04/20 1300  Weight: 84.2 kg    Examination: Gen: Older female in no distress Pulm: Nonlabored breathing 2L O2. Clear. CV: Regular rate and rhythm. No murmur, rub, or gallop. No JVD, no dependent edema. GI: Abdomen soft, non-tender, non-distended, with normoactive bowel sounds.  Ext: Warm, no deformities Skin: No new rashes, lesions or ulcers on visualized skin. Neuro: Alert and oriented (missed date by 2 days but  accurate day of the week and very firm understanding of hospital course) without focal neurological deficits. Psych: Judgement and insight appear fair. Mood depressed & affect congruent and labile.  Data Reviewed: I have personally reviewed following labs and imaging studies  CBC: Recent Labs  Lab 02/03/20 1820 02/03/20 1820 02/04/20 9563 02/04/20 8756 02/04/20 2035 02/05/20 0417 02/06/20 0408 02/07/20 0321 02/08/20 0418  WBC 18.2*   < > 11.9*   < > 11.4* 27.6* 15.4* 11.5* 10.6*  NEUTROABS 16.4*  --  11.2*  --  10.4*  --   --   --   --   HGB 11.3*   < > 9.0*   < > 10.5* 8.4* 8.5* 8.9* 9.7*  HCT 35.8*   < > 28.0*   < > 33.2* 26.2* 27.2* 28.3* 30.8*  MCV 90.4   < > 89.2   < > 91.0 90.0 91.3 92.2 90.1  PLT 238   < > 187   < > 189 167 152 171 204   < > = values  in this interval not displayed.   Basic Metabolic Panel: Recent Labs  Lab 02/04/20 2035 02/04/20 2035 02/05/20 0417 02/06/20 0408 02/07/20 0321 02/08/20 0418 02/10/20 0506  NA 133*  --  132* 137 136 135  --   K 3.5  --  3.5 3.0* 3.8 4.4  --   CL 100  --  101 105 105 97*  --   CO2 19*  --  22 22 24 28   --   GLUCOSE 106*  --  190* 146* 103* 89  --   BUN 40*  --  41* 37* 26* 23  --   CREATININE 1.70*   < > 1.89* 1.54* 1.60* 1.62* 1.45*  CALCIUM 8.5*  --  8.0* 8.4* 8.1* 8.6*  --    < > = values in this interval not displayed.   GFR: Estimated Creatinine Clearance: 29.8 mL/min (A) (by C-G formula based on SCr of 1.45 mg/dL (H)). Liver Function Tests: Recent Labs  Lab 02/04/20 2035 02/05/20 0417 02/06/20 0408 02/07/20 0321 02/08/20 0418  AST 20 17 21 18  14*  ALT 18 16 20 19 17   ALKPHOS 261* 134* 120 105 111  BILITOT 0.9 0.6 0.5 0.3 0.5  PROT 6.8 5.7* 5.6* 5.5* 6.3*  ALBUMIN 2.8* 2.2* 2.2* 2.1* 2.5*   Cardiac Enzymes: Recent Labs  Lab 02/03/20 1820  CKTOTAL 60   Sepsis Labs: Recent Labs  Lab 02/04/20 1533 02/04/20 1749 02/04/20 2035 02/04/20 2327  LATICACIDVEN 1.4 1.3 2.2* 1.1    Recent  Results (from the past 240 hour(s))  Resp Panel by RT PCR (RSV, Flu A&B, Covid) - Nasopharyngeal Swab     Status: None   Collection Time: 02/03/20  6:25 PM   Specimen: Nasopharyngeal Swab  Result Value Ref Range Status   SARS Coronavirus 2 by RT PCR NEGATIVE NEGATIVE Final    Comment: (NOTE) SARS-CoV-2 target nucleic acids are NOT DETECTED.  The SARS-CoV-2 RNA is generally detectable in upper respiratoy specimens during the acute phase of infection. The lowest concentration of SARS-CoV-2 viral copies this assay can detect is 131 copies/mL. A negative result does not preclude SARS-Cov-2 infection and should not be used as the sole basis for treatment or other patient management decisions. A negative result may occur with  improper specimen collection/handling, submission of specimen other than nasopharyngeal swab, presence of viral mutation(s) within the areas targeted by this assay, and inadequate number of viral copies (<131 copies/mL). A negative result must be combined with clinical observations, patient history, and epidemiological information. The expected result is Negative.  Fact Sheet for Patients:  PinkCheek.be  Fact Sheet for Healthcare Providers:  GravelBags.it  This test is no t yet approved or cleared by the [place] FDA and  has been authorized for detection and/or diagnosis of SARS-CoV-2 by FDA under an Emergency Use Authorization (EUA). This EUA will remain  in effect (meaning this test can be used) for the duration of the COVID-19 declaration under Section 564(b)(1) of the Act, 21 U.S.C. section 360bbb-3(b)(1), unless the authorization is terminated or revoked sooner.     Influenza A by PCR NEGATIVE NEGATIVE Final   Influenza B by PCR NEGATIVE NEGATIVE Final    Comment: (NOTE) The Xpert Xpress SARS-CoV-2/FLU/RSV assay is intended as an aid in  the diagnosis of influenza from Nasopharyngeal swab  specimens and  should not be used as a sole basis for treatment. Nasal washings and  aspirates are unacceptable for Xpert Xpress SARS-CoV-2/FLU/RSV  testing.  Fact Sheet for Patients:  PinkCheek.be  Fact Sheet for Healthcare Providers: GravelBags.it  This test is not yet approved or cleared by the [place] FDA and  has been authorized for detection and/or diagnosis of SARS-CoV-2 by  FDA under an Emergency Use Authorization (EUA). This EUA will remain  in effect (meaning this test can be used) for the duration of the  Covid-19 declaration under Section 564(b)(1) of the Act, 21  U.S.C. section 360bbb-3(b)(1), unless the authorization is  terminated or revoked.    Respiratory Syncytial Virus by PCR NEGATIVE NEGATIVE Final    Comment: (NOTE) Fact Sheet for Patients: PinkCheek.be  Fact Sheet for Healthcare Providers: GravelBags.it  This test is not yet approved or cleared by the [place] FDA and  has been authorized for detection and/or diagnosis of SARS-CoV-2 by  FDA under an Emergency Use Authorization (EUA). This EUA will remain  in effect (meaning this test can be used) for the duration of the  COVID-19 declaration under Section 564(b)(1) of the Act, 21 U.S.C.  section 360bbb-3(b)(1), unless the authorization is terminated or  revoked. Performed at [facility], [place] [address], [place], [place] [address]   Culture, blood (routine x 2) Call MD if unable to obtain prior to antibiotics being given     Status: Abnormal   Collection Time: 02/04/20  6:45 AM   Specimen: BLOOD  Result Value Ref Range Status   Specimen Description   Final    BLOOD LEFT ANTECUBITAL Performed at [facility] [address] [place], [place] [address]    Special Requests   Final    BOTTLES DRAWN AEROBIC AND ANAEROBIC Blood Culture adequate  volume Performed at [facility] [address], [place], [place] [address]    Culture  Setup Time   Final    AEROBIC BOTTLE ONLY GRAM POSITIVE COCCI Organism ID to follow CRITICAL RESULT CALLED TO, READ BACK BY AND VERIFIED WITH: E JACKSON PHARMD 02/06/20 0210 JDW    Culture (A)  Final    STAPHYLOCOCCUS EPIDERMIDIS THE SIGNIFICANCE OF ISOLATING THIS ORGANISM FROM A SINGLE SET OF BLOOD CULTURES WHEN MULTIPLE SETS ARE DRAWN IS UNCERTAIN. PLEASE NOTIFY THE MICROBIOLOGY DEPARTMENT WITHIN ONE WEEK IF SPECIATION AND SENSITIVITIES ARE REQUIRED. Performed at [facility], [place] [address], [place], [place] [address]    Report Status 02/06/2020 FINAL  Final  Blood Culture ID Panel (Reflexed)     Status: Abnormal   Collection Time: 02/04/20  6:45 AM  Result Value Ref Range Status   Enterococcus faecalis NOT DETECTED NOT DETECTED Final   Enterococcus Faecium NOT DETECTED NOT DETECTED Final   Listeria monocytogenes NOT DETECTED NOT DETECTED Final   Staphylococcus species DETECTED (A) NOT DETECTED Final    Comment: CRITICAL RESULT CALLED TO, READ BACK BY AND VERIFIED WITH: E JACKSON PHARMD 02/06/20 0210 JDW    Staphylococcus aureus (BCID) NOT DETECTED NOT DETECTED Final   Staphylococcus epidermidis DETECTED (A) NOT DETECTED Final    Comment: Methicillin (oxacillin) resistant coagulase negative staphylococcus. Possible blood culture contaminant (unless isolated from more than one blood culture draw or clinical case suggests pathogenicity). No antibiotic treatment is indicated for blood  culture contaminants. CRITICAL RESULT CALLED TO, READ BACK BY AND VERIFIED WITH: E JACKSON PHARMD 02/06/20 0210 JDW    Staphylococcus lugdunensis NOT DETECTED NOT DETECTED Final   Streptococcus species NOT DETECTED NOT DETECTED Final   Streptococcus agalactiae NOT DETECTED NOT DETECTED Final   Streptococcus pneumoniae NOT DETECTED NOT DETECTED Final  Streptococcus pyogenes NOT DETECTED  NOT DETECTED Final   A.calcoaceticus-baumannii NOT DETECTED NOT DETECTED Final   Bacteroides fragilis NOT DETECTED NOT DETECTED Final   Enterobacterales NOT DETECTED NOT DETECTED Final   Enterobacter cloacae complex NOT DETECTED NOT DETECTED Final   Escherichia coli NOT DETECTED NOT DETECTED Final   Klebsiella aerogenes NOT DETECTED NOT DETECTED Final   Klebsiella oxytoca NOT DETECTED NOT DETECTED Final   Klebsiella pneumoniae NOT DETECTED NOT DETECTED Final   Proteus species NOT DETECTED NOT DETECTED Final   Salmonella species NOT DETECTED NOT DETECTED Final   Serratia marcescens NOT DETECTED NOT DETECTED Final   Haemophilus influenzae NOT DETECTED NOT DETECTED Final   Neisseria meningitidis NOT DETECTED NOT DETECTED Final   Pseudomonas aeruginosa NOT DETECTED NOT DETECTED Final   Stenotrophomonas maltophilia NOT DETECTED NOT DETECTED Final   Candida albicans NOT DETECTED NOT DETECTED Final   Candida auris NOT DETECTED NOT DETECTED Final   Candida glabrata NOT DETECTED NOT DETECTED Final   Candida krusei NOT DETECTED NOT DETECTED Final   Candida parapsilosis NOT DETECTED NOT DETECTED Final   Candida tropicalis NOT DETECTED NOT DETECTED Final   Cryptococcus neoformans/gattii NOT DETECTED NOT DETECTED Final   Methicillin resistance mecA/C DETECTED (A) NOT DETECTED Final    Comment: CRITICAL RESULT CALLED TO, READ BACK BY AND VERIFIED WITHSeleta Rhymes [facility] 02/06/20 0210 JDW Performed at [facility], [address], [place], [place] [address]   Culture, blood (routine x 2) Call MD if unable to obtain prior to antibiotics being given     Status: None   Collection Time: 02/04/20  7:10 AM   Specimen: BLOOD RIGHT HAND  Result Value Ref Range Status   Specimen Description   Final    BLOOD RIGHT HAND Performed at [facility] [address] [place], [place] [address]    Special Requests   Final    BOTTLES DRAWN AEROBIC AND ANAEROBIC Blood Culture adequate  volume Performed at [facility] [address], [place], [place] [address]    Culture   Final    NO GROWTH 5 DAYS Performed at [facility], [place] [address], [place], [place] [address]    Report Status 02/09/2020 FINAL  Final  MRSA PCR Screening     Status: None   Collection Time: 02/05/20  1:37 PM   Specimen: Nasal Mucosa; Nasopharyngeal  Result Value Ref Range Status   MRSA by PCR NEGATIVE NEGATIVE Final    Comment:        The GeneXpert MRSA Assay (FDA approved for NASAL specimens only), is one component of a comprehensive MRSA colonization surveillance program. It is not intended to diagnose MRSA infection nor to guide or monitor treatment for MRSA infections. Performed at [facility], [place] [address], [place], [place] [address]   SARS Coronavirus 2 by RT PCR (hospital order, performed in [facility] hospital lab) Nasopharyngeal Nasopharyngeal Swab     Status: None   Collection Time: 02/08/20  4:25 PM   Specimen: Nasopharyngeal Swab  Result Value Ref Range Status   SARS Coronavirus 2 NEGATIVE NEGATIVE Final    Comment: (NOTE) SARS-CoV-2 target nucleic acids are NOT DETECTED.  The SARS-CoV-2 RNA is generally detectable in upper and lower respiratory specimens during the acute phase of infection. The lowest concentration of SARS-CoV-2 viral copies this assay can detect is 250 copies / mL. A negative result does not preclude SARS-CoV-2 infection and should not be used as  the sole basis for treatment or other patient management decisions.  A negative result may occur with improper specimen collection / handling, submission of specimen other than nasopharyngeal swab, presence of viral mutation(s) within the areas targeted by this assay, and inadequate number of viral copies (<250 copies / mL). A negative result must be combined with clinical observations, patient history, and epidemiological information.  Fact Sheet for Patients:    StrictlyIdeas.no  Fact Sheet for Healthcare Providers: BankingDealers.co.za  This test is not yet approved or  cleared by the [place] FDA and has been authorized for detection and/or diagnosis of SARS-CoV-2 by FDA under an Emergency Use Authorization (EUA).  This EUA will remain in effect (meaning this test can be used) for the duration of the COVID-19 declaration under Section 564(b)(1) of the Act, 21 U.S.C. section 360bbb-3(b)(1), unless the authorization is terminated or revoked sooner.  Performed at [facility], [place] [address], [place], [place] [address]     Radiology Studies: NM Bone Scan Whole Body  Result Date: 02/09/2020 CLINICAL DATA:  Metastatic disease evaluation, sclerotic foci on pelvic radiographs several falls at home, BILATERAL leg pain, history hypertension EXAM: NUCLEAR MEDICINE WHOLE BODY BONE SCAN TECHNIQUE: Whole body anterior and posterior images were obtained approximately 3 hours after intravenous injection of radiopharmaceutical. RADIOPHARMACEUTICALS:  21.2 mCi Technetium-57m MDP IV COMPARISON:  None Radiographic correlation: LEFT hip and pelvic radiographs 02/07/2020 FINDINGS: Multiple foci of abnormal osseous tracer accumulation consistent with osseous metastases. These include calvarium, sternum, posterior ribs, thoracic and lumbar spine, pelvis, and proximal RIGHT femur. Question subtle metastatic lesion at mid RIGHT femoral diaphysis. Fairly symmetric uptake is seen at the humeral heads, proximal femora, knees, and ankles, nonspecific but symmetry could reflect marrow reconversion. Expected urinary tract and soft tissue distribution of tracer. IMPRESSION: Scattered osseous metastases. Metastatic lesion involving proximal RIGHT femur and questionably mid RIGHT femoral diaphysis. Electronically Signed   By: Lavonia Dana M.D.   On: 02/09/2020 14:38   Scheduled Meds: . enoxaparin (LOVENOX)  injection  30 mg Subcutaneous Q24H  . escitalopram  5 mg Oral Daily  . lamoTRIgine  150 mg Oral BID  . lidocaine  2 patch Transdermal Q24H  . oxybutynin  5 mg Oral BID   Continuous Infusions: . sodium chloride       LOS: 7 days   Time spent: 35 minutes discussing results of bone scan with the patient and potential plans, as well as speaking with oncology consultant and personally interpreting available imaging and lab values.  Patrecia Pour, MD 02/10/2020, 2:32 PM

## 2020-02-10 NOTE — Consult Note (Signed)
[facility] CONSULT NOTE  Patient Care Team: Donald Prose, MD as PCP - General (Family Medicine)  ASSESSMENT & PLAN:   Diffuse sclerotic bone lesion, worrisome for metastatic cancer, unknown origin I could not find any localizing signs to suggest her primary malignancy I recommend CT scan of the chest, abdomen and pelvis with contrast for further evaluation Metastatic breast cancer or lung cancer can cause the bone lesion seen I will return tomorrow once we have results of imaging study to guide biopsy  Anemia Multifactorial, likely due to recent infection and anemia chronic kidney disease I will order additional work-up for this  Respiratory failure secondary to pneumonia Continue antibiotics per hospitalist team  Discharge planning I do not recommend discharge until her work-up is complete Plan of care is discussed with primary service I will continue to follow    All questions were answered. The patient knows to call the clinic with any problems, questions or concerns. No barriers to learning was detected.  Heath Lark, MD 10/27/20212:59 PM  CHIEF COMPLAINTS/PURPOSE OF CONSULTATION:  Diffuse sclerotic lesion, concern for metastatic cancer to the bone  HISTORY OF PRESENTING ILLNESS:  Kathleen Schneider 79 y.o. female is seen at the request of hospitalist team to evaluate this patient with diffuse sclerotic lesion seen and abnormal bone scan, worrisome for metastatic cancer to the bone The patient was sleepy when I enter but answers questions appropriately She is a retired Network engineer She used to smoke but quit 30 years ago She had recurrent hospitalization this year for multiple reasons In August, she had traumatic rhabdomyolysis Approximately a week ago, she was admitted due to respiratory failure secondary to community-acquired pneumonia She lives alone with no next of kin  On February 07, 2020, she underwent imaging study of her hip which show sclerotic bone  lesion She underwent bone scan today which showed diffuse metastatic lesion in her bone She denies bone pain She have history of fall but none recently according to the patient She denies family history of cancer She could not remember when she had her last mammogram. According to electronic records, she had mammogram in 2002. She also have colonoscopy in 2010 She had Pap smear around 2012 She was never told she have abnormal imaging study or screening for malignancies She denies any recent abnormal breast examination, palpable mass, abnormal breast appearance or nipple changes  She denies cough since admission.  She had shortness of breath on minimal exertion Denies recent changes in bowel habits Denies recent changes in weight  MEDICAL HISTORY:  Past Medical History:  Diagnosis Date   HTN (hypertension)    Vision abnormalities     SURGICAL HISTORY: Past Surgical History:  Procedure Laterality Date   CHOLECYSTECTOMY, LAPAROSCOPIC      SOCIAL HISTORY: Social History   Socioeconomic History   Marital status: Married    Spouse name: Not on file   Number of children: Not on file   Years of education: Not on file   Highest education level: Not on file  Occupational History   Not on file  Tobacco Use   Smoking status: Former Smoker   Smokeless tobacco: Never Used  Substance and Sexual Activity   Alcohol use: Yes    Alcohol/week: 0.0 standard drinks    Comment: Rare--about twice per yr/fim   Drug use: No   Sexual activity: Not on file  Other Topics Concern   Not on file  Social History Narrative   Not on file  Social Determinants of Health   Financial Resource Strain:    Difficulty of Paying Living Expenses: Not on file  Food Insecurity:    Worried About Charity fundraiser in the Last Year: Not on file   [employer] of Food in the Last Year: Not on file  Transportation Needs:    Lack of Transportation (Medical): Not on file   Lack of  Transportation (Non-Medical): Not on file  Physical Activity:    Days of Exercise per Week: Not on file   Minutes of Exercise per Session: Not on file  Stress:    Feeling of Stress : Not on file  Social Connections:    Frequency of Communication with Friends and Family: Not on file   Frequency of Social Gatherings with Friends and Family: Not on file   Attends Religious Services: Not on file   Active Member of Clubs or Organizations: Not on file   Attends Archivist Meetings: Not on file   Marital Status: Not on file  Intimate Partner Violence:    Fear of Current or Ex-Partner: Not on file   Emotionally Abused: Not on file   Physically Abused: Not on file   Sexually Abused: Not on file    FAMILY HISTORY: Family History  Problem Relation Age of Onset   Seizures Mother    Alcoholism Father     ALLERGIES:  has No Known Allergies.  MEDICATIONS:  Current Facility-Administered Medications  Medication Dose Route Frequency Provider Last Rate Last Admin   0.9 %  sodium chloride infusion   Intravenous PRN Georgette Shell, MD       acetaminophen (TYLENOL) tablet 500 mg  500 mg Oral Q6H PRN Georgette Shell, MD   500 mg at 02/06/20 1003   enoxaparin (LOVENOX) injection 30 mg  30 mg Subcutaneous Q24H Georgette Shell, MD   30 mg at 02/10/20 1009   escitalopram (LEXAPRO) tablet 5 mg  5 mg Oral Daily Georgette Shell, MD   5 mg at 02/10/20 1008   ipratropium-albuterol (DUONEB) 0.5-2.5 (3) MG/3ML nebulizer solution 3 mL  3 mL Nebulization Q6H PRN Georgette Shell, MD   3 mL at 02/07/20 0820   lamoTRIgine (LAMICTAL) tablet 150 mg  150 mg Oral BID Georgette Shell, MD   150 mg at 02/10/20 1007   lidocaine (LIDODERM) 5 % 2 patch  2 patch Transdermal Q24H Georgette Shell, MD   2 patch at 02/10/20 1322   oxybutynin (DITROPAN) tablet 5 mg  5 mg Oral BID Georgette Shell, MD   5 mg at 02/10/20 1007    REVIEW OF SYSTEMS:    Constitutional: Denies fevers, chills or abnormal night sweats Eyes: Denies blurriness of vision, double vision or watery eyes Ears, nose, mouth, throat, and face: Denies mucositis or sore throat Respiratory: Denies cough, dyspnea or wheezes Cardiovascular: Denies palpitation, chest discomfort or lower extremity swelling Gastrointestinal:  Denies nausea, heartburn or change in bowel habits Skin: Denies abnormal skin rashes Lymphatics: Denies new lymphadenopathy or easy bruising Neurological:Denies numbness, tingling or new weaknesses Behavioral/Psych: Mood is stable, no new changes  All other systems were reviewed with the patient and are negative.  PHYSICAL EXAMINATION: ECOG PERFORMANCE STATUS: 4 - Bedbound  Vitals:   02/10/20 0353 02/10/20 1357  BP: 124/65 103/60  Pulse: 69 77  Resp: 15 19  Temp: 97.8 F (36.6 C) 99.1 F (37.3 C)  SpO2: 98% 96%   Filed Weights   02/04/20 1300  Weight: 185 lb 10 oz (84.2 kg)    GENERAL:alert, no distress and comfortable.  She looks elderly and frail.  Central obesity is limiting examination SKIN: skin color, texture, turgor are normal, no rashes or significant lesions EYES: normal, conjunctiva are pink and non-injected, sclera clear OROPHARYNX:no exudate, no erythema and lips, buccal mucosa, and tongue normal  NECK: supple, thyroid normal size, non-tender, without nodularity LYMPH:  no palpable lymphadenopathy in the cervical, axillary or inguinal LUNGS: clear to auscultation and percussion with normal breathing effort HEART: regular rate & rhythm and no murmurs and no lower extremity edema ABDOMEN:abdomen soft, non-tender and normal bowel sounds Musculoskeletal:no cyanosis of digits and no clubbing  PSYCH: alert & oriented x 3 with fluent speech NEURO: no focal motor/sensory deficits I received permission to examine her breasts bilaterally.  No abnormalities are noted on breast exam.  No nipple changes  LABORATORY DATA:  I have  reviewed the data as listed Lab Results  Component Value Date   WBC 10.6 (H) 02/08/2020   HGB 9.7 (L) 02/08/2020   HCT 30.8 (L) 02/08/2020   MCV 90.1 02/08/2020   PLT 204 02/08/2020   Recent Labs    12/09/19 0456 12/09/19 0456 12/10/19 0604 12/10/19 0604 12/11/19 0538 02/03/20 1820 02/06/20 0408 02/06/20 0408 02/07/20 0321 02/08/20 0418 02/10/20 0506  NA 145   < > 138   < > 142   < > 137  --  136 135  --   K 4.9   < > 3.9   < > 4.6   < > 3.0*  --  3.8 4.4  --   CL 109   < > 108   < > 112*   < > 105  --  105 97*  --   CO2 21*   < > 22   < > 23   < > 22  --  24 28  --   GLUCOSE 112*   < > 97   < > 114*   < > 146*  --  103* 89  --   BUN 57*   < > 32*   < > 22   < > 37*  --  26* 23  --   CREATININE 2.30*   < > 1.56*   < > 1.67*   < > 1.54*   < > 1.60* 1.62* 1.45*  CALCIUM 9.3   < > 8.2*   < > 8.6*   < > 8.4*  --  8.1* 8.6*  --   GFRNONAA 19*   < > 31*   < > 29*   < > 34*   < > 32* 32* 36*  GFRAA 23*  --  36*  --  33*  --   --   --   --   --   --   PROT 8.2*   < > 5.9*  --   --    < > 5.6*  --  5.5* 6.3*  --   ALBUMIN 3.3*   < > 2.4*  --   --    < > 2.2*  --  2.1* 2.5*  --   AST 63*   < > 32  --   --    < > 21  --  18 14*  --   ALT 42   < > 30  --   --    < > 20  --  19 17  --   ALKPHOS 72   < >  59  --   --    < > 120  --  105 111  --   BILITOT 1.2   < > 0.3  --   --    < > 0.5  --  0.3 0.5  --    < > = values in this interval not displayed.    RADIOGRAPHIC STUDIES: I have personally reviewed the radiological images as listed and agreed with the findings in the report. NM Bone Scan Whole Body  Result Date: 02/09/2020 CLINICAL DATA:  Metastatic disease evaluation, sclerotic foci on pelvic radiographs several falls at home, BILATERAL leg pain, history hypertension EXAM: NUCLEAR MEDICINE WHOLE BODY BONE SCAN TECHNIQUE: Whole body anterior and posterior images were obtained approximately 3 hours after intravenous injection of radiopharmaceutical. RADIOPHARMACEUTICALS:  21.2 mCi  Technetium-39m MDP IV COMPARISON:  None Radiographic correlation: LEFT hip and pelvic radiographs 02/07/2020 FINDINGS: Multiple foci of abnormal osseous tracer accumulation consistent with osseous metastases. These include calvarium, sternum, posterior ribs, thoracic and lumbar spine, pelvis, and proximal RIGHT femur. Question subtle metastatic lesion at mid RIGHT femoral diaphysis. Fairly symmetric uptake is seen at the humeral heads, proximal femora, knees, and ankles, nonspecific but symmetry could reflect marrow reconversion. Expected urinary tract and soft tissue distribution of tracer. IMPRESSION: Scattered osseous metastases. Metastatic lesion involving proximal RIGHT femur and questionably mid RIGHT femoral diaphysis. Electronically Signed   By: Lavonia Dana M.D.   On: 02/09/2020 14:38   DG CHEST PORT 1 VIEW  Result Date: 02/07/2020 CLINICAL DATA:  New onset shortness of breath EXAM: PORTABLE CHEST 1 VIEW COMPARISON:  Three days ago FINDINGS: Large lung volumes with diaphragm flattening. Trace pleural effusions and generalized reticulation that is stable. Normal heart size for technique. No pneumothorax. IMPRESSION: 1. Trace pleural effusions with mild scarring or atelectasis at the left base. 2. Stable interstitial prominence, favored bronchitic Electronically Signed   By: Monte Fantasia M.D.   On: 02/07/2020 08:46   DG CHEST PORT 1 VIEW  Result Date: 02/04/2020 CLINICAL DATA:  Shortness of breath. EXAM: PORTABLE CHEST 1 VIEW COMPARISON:  February 03, 2020 FINDINGS: Mild, stable, diffuse chronic appearing increased interstitial lung markings are seen. Mild, stable atelectasis is noted within the lateral aspect of the left lung base. There is no evidence of a pleural effusion or pneumothorax. The heart size and mediastinal contours are within normal limits. Radiopaque and lucent artifact is seen overlying the lateral aspect of the mid left chest wall. The visualized skeletal structures are otherwise  unremarkable. IMPRESSION: Mild, stable left basilar atelectasis. Electronically Signed   By: Virgina Norfolk M.D.   On: 02/04/2020 20:00   DG Chest Port 1 View  Result Date: 02/03/2020 CLINICAL DATA:  Shortness of breath and cough for 1 month EXAM: PORTABLE CHEST 1 VIEW COMPARISON:  None. FINDINGS: Cardiac shadow is at the upper limits of normal in size. Aortic calcifications are seen. The lungs are well aerated bilaterally with mild left basilar atelectasis/early infiltrate. No bony abnormality is noted. IMPRESSION: Left basilar opacity consistent with atelectasis/infiltrate. Electronically Signed   By: Inez Catalina M.D.   On: 02/03/2020 20:06   ECHOCARDIOGRAM COMPLETE  Result Date: 02/05/2020    ECHOCARDIOGRAM REPORT   Patient Name:   Kathleen Schneider Date of Exam: 02/04/2020 Medical Rec #:  [id number]       Height:       60.0 in Accession #:    [id number]      Weight:       185.6  lb Date of Birth:  Jul 12, 1940       BSA:          1.808 m Patient Age:    54 years        BP:           124/83 mmHg Patient Gender: F               HR:           76 bpm. Exam Location:  Inpatient Procedure: 2D Echo Indications:    786.09 dyspnea  History:        Patient has no prior history of Echocardiogram examinations.                 Risk Factors:Former Smoker and Hypertension.  Sonographer:    Jannett Celestine RDCS (AE) Referring Phys: [id number] [facility] Julious Oka  Sonographer Comments: Image acquisition challenging due to patient body habitus. IMPRESSIONS  1. Left ventricular ejection fraction, by estimation, is 65 to 70%. The left ventricle has normal function. The left ventricle has no regional wall motion abnormalities. Left ventricular diastolic parameters are consistent with Grade I diastolic dysfunction (impaired relaxation). Elevated left atrial pressure.  2. Right ventricular systolic function is normal. The right ventricular size is normal. There is normal pulmonary artery systolic pressure.  3. The mitral valve is normal  in structure. Mild mitral valve regurgitation. No evidence of mitral stenosis.  4. The aortic valve is normal in structure. Aortic valve regurgitation is not visualized. Mild to moderate aortic valve sclerosis/calcification is present, without any evidence of aortic stenosis.  5. The inferior vena cava is normal in size with greater than 50% respiratory variability, suggesting right atrial pressure of 3 mmHg. Comparison(s): No prior Echocardiogram. FINDINGS  Left Ventricle: Left ventricular ejection fraction, by estimation, is 65 to 70%. The left ventricle has normal function. The left ventricle has no regional wall motion abnormalities. The left ventricular internal cavity size was normal in size. There is  no left ventricular hypertrophy. Left ventricular diastolic parameters are consistent with Grade I diastolic dysfunction (impaired relaxation). Elevated left atrial pressure. Right Ventricle: The right ventricular size is normal. No increase in right ventricular wall thickness. Right ventricular systolic function is normal. There is normal pulmonary artery systolic pressure. Left Atrium: Left atrial size was normal in size. Right Atrium: Right atrial size was normal in size. Pericardium: There is no evidence of pericardial effusion. Mitral Valve: The mitral valve is normal in structure. Mild mitral valve regurgitation. No evidence of mitral valve stenosis. Tricuspid Valve: The tricuspid valve is normal in structure. Tricuspid valve regurgitation is trivial. No evidence of tricuspid stenosis. Aortic Valve: The aortic valve is normal in structure. Aortic valve regurgitation is not visualized. Mild to moderate aortic valve sclerosis/calcification is present, without any evidence of aortic stenosis. Pulmonic Valve: The pulmonic valve was normal in structure. Pulmonic valve regurgitation is not visualized. No evidence of pulmonic stenosis. Aorta: The aortic root is normal in size and structure. Venous: The inferior  vena cava is normal in size with greater than 50% respiratory variability, suggesting right atrial pressure of 3 mmHg. IAS/Shunts: No atrial level shunt detected by color flow Doppler.  LEFT VENTRICLE PLAX 2D LVIDd:         5.07 cm  Diastology LVIDs:         2.88 cm  LV e' medial:    8.38 cm/s LV PW:         0.85 cm  LV E/e' medial:  11.8 LV IVS:        0.90 cm  LV e' lateral:   8.05 cm/s LVOT diam:     2.20 cm  LV E/e' lateral: 12.2 LV SV:         97 LV SV Index:   54 LVOT Area:     3.80 cm  RIGHT VENTRICLE RV S prime:     11.10 cm/s TAPSE (M-mode): 1.8 cm LEFT ATRIUM           Index LA diam:      4.00 cm 2.21 cm/m LA Vol (A2C): 55.8 ml 30.86 ml/m  AORTIC VALVE LVOT Vmax:   99.80 cm/s LVOT Vmean:  65.100 cm/s LVOT VTI:    0.256 m  AORTA Ao Root diam: 2.70 cm MITRAL VALVE MV Area (PHT): 3.27 cm    SHUNTS MV Decel Time: 232 msec    Systemic VTI:  0.26 m MV E velocity: 98.50 cm/s  Systemic Diam: 2.20 cm MV A velocity: 88.30 cm/s MV E/A ratio:  1.12 Ena Dawley MD Electronically signed by Ena Dawley MD Signature Date/Time: 02/05/2020/9:18:19 AM    Final    DG HIPS BILAT WITH PELVIS 3-4 VIEWS  Result Date: 02/07/2020 CLINICAL DATA:  Increased weakness and shortness of breath. EXAM: DG HIP (WITH OR WITHOUT PELVIS) 3-4V BILAT COMPARISON:  None. FINDINGS: There is no evidence of an acute hip fracture or dislocation. Mild degenerative changes seen involving both hips, in the form of joint space narrowing and acetabular sclerosis. Numerous ill-defined sclerotic foci are seen scattered throughout the pelvis and bilateral lower extremities. IMPRESSION: 1. Numerous sclerotic foci, as described above, which may represent sequelae associated with osseous metastasis. Correlation with a nuclear medicine bone scan is recommended. 2. Degenerative changes involving both hips. Electronically Signed   By: Virgina Norfolk M.D.   On: 02/07/2020 16:12

## 2020-02-10 NOTE — TOC Progression Note (Addendum)
Transition of Care [facility]) - Progression Note    Patient Details  Name: Kathleen Schneider MRN: [id number] Date of Birth: 1940-05-19  Transition of Care [facility]) CM/SW [place], [place] Phone Number: 02/10/2020, 3:04 PM  Clinical Narrative:  Spoke with Renee, RN CM with [facility], [phone] who explained the extent of possible services and resources for [name] to pursue.  Working on [facility] and in home nursing aide for up to 8 hours per day per conversation with [name] this AM.  Awaiting call back from [facility] re: potential of in-home medical response team. [name] was given contact information for [facility] for help with MCD approval down the line. CSW to alert patient's emergncy contact [place] [phone].  Denial letter from insurance given patient. TOC will continue to follow during the course of hospitalization.  Addendum: [place] with [facility] confirmed ability to provide [facility] PT, OT, RN, SW.  [name] states patient was getting in-home services prior to admission through [facility]. [name] is the contact at [phone]. She confirmed the ability to provide an 8 hour split shift if [name] decides to go that route. Information shared with patient.  Patient will need order for DME 3 in 1.    Expected Discharge Plan: [place] [facility] Barriers to Discharge: Other (comment) (necessary services for successful return home)  Expected Discharge Plan and Services Expected Discharge Plan: [place]   Discharge Planning Services: CM Consult Post Acute Care Choice: [place] arrangements for the past 2 months: Single Family Home Expected Discharge Date: 02/09/20                                     Social Determinants of Health (SDOH) Interventions    Readmission Risk Interventions Readmission Risk Prevention Plan 12/10/2019  Medication Screening Complete  Transportation Screening Complete  Some recent data  might be hidden

## 2020-02-10 NOTE — Progress Notes (Signed)
Occupational Therapy Treatment Patient Details Name: Kathleen Schneider MRN: [id number] DOB: 1940/10/22 Today's Date: 02/10/2020    History of present illness 79 yo female admitted with Pna, Sepsis after falling at home. Hx of CKD, gait abnormality, visual deficits, spinal stenosis   OT comments  Patient more somnolent today with depressed affect. Patient transferred to side of bed with mod assist and performing grooming task. Patient's posture kyphotic and patient's head down. Patient stood with min assist to take side steps to the head of the bed. Patient's [place] removed and patient dropped to 88% with activity on RA. Patient mod assist to return to bed. One episode of crying this treatment.    Follow Up Recommendations  SNF    Equipment Recommendations  3 in 1 bedside commode    Recommendations for Other Services      Precautions / Restrictions Precautions Precautions: Fall Precaution Comments: monitor O2       Mobility Bed Mobility Overal bed mobility: Needs Assistance Bed Mobility: Supine to Sit;Sit to Supine     Supine to sit: Mod assist;HOB elevated Sit to supine: Mod assist   General bed mobility comments: Assistance for trunk and pivot to edge of bed with patient using bed rail to transfer into sitting. Assistance for BLEs with return to bed.  Transfers Overall transfer level: Needs assistance Equipment used: Rolling walker (2 wheeled) Transfers: Sit to/from Stand Sit to Stand: Min assist;From elevated surface         General transfer comment: Min assis to stand from elevated bed. Min guard to take steps to the head of the bed with verbal cues for sequencing. [place] removed while at side of bed. Maintained above 90% at rest. Dropped to 88% on RA with side steps.    Balance Overall balance assessment: Needs assistance Sitting-balance support: No upper extremity supported;Feet supported Sitting balance-[name]: Fair     Standing balance support: Bilateral upper  extremity supported Standing balance-[name]: Poor                             ADL either performed or assessed with clinical judgement   ADL       Grooming: Wash/dry face;Sitting Grooming Details (indicate cue type and reason): Patient washed face sitting at side of bed today with set up. today patient's posture kyphotic and patient neck flexed and face to the floor. Patient's lip began bleeding - needed prolonged time at edge of bed to manage bleeding.                                     Vision Patient Visual Report: No change from baseline Vision Assessment?: No apparent visual deficits   Perception     Praxis      Cognition Arousal/Alertness: Awake/alert Behavior During Therapy: WFL for tasks assessed/performed Overall Cognitive Status: Impaired/Different from baseline Area of Impairment: Memory;Safety/judgement;Awareness                     Memory: Decreased short-term memory   Safety/Judgement: Decreased awareness of deficits Awareness: Emergent   General Comments: Patient alert to self and place. Knows the month and year but not the date. Didn't realize she was being treated for pneumonia. She is mildly somnolent and depressed looking today. One episode of crying this treatment.        Exercises  Shoulder Instructions       General Comments      Pertinent Vitals/ Pain       Pain Assessment: No/denies pain  Home Living                                          Prior Functioning/Environment              Frequency  Min 2X/week        Progress Toward Goals  OT Goals(current goals can now be found in the care plan section)  Progress towards OT goals: Progressing toward goals  Acute Rehab OT Goals Patient Stated Goal: to regain independence and PLOF OT Goal Formulation: With patient Time For Goal Achievement: 02/20/20 Potential to Achieve Goals: Good  Plan Discharge plan remains  appropriate    Co-evaluation                 AM-PAC OT "6 Clicks" Daily Activity     Outcome Measure   Help from another person eating meals?: None Help from another person taking care of personal grooming?: A Little Help from another person toileting, which includes using toliet, bedpan, or urinal?: A Lot Help from another person bathing (including washing, rinsing, drying)?: A Lot Help from another person to put on and taking off regular upper body clothing?: A Little Help from another person to put on and taking off regular lower body clothing?: A Lot 6 Click Score: 16    End of Session Equipment Utilized During Treatment: Rolling walker;Oxygen  OT Visit Diagnosis: Unsteadiness on feet (R26.81);Muscle weakness (generalized) (M62.81);History of falling (Z91.81)   Activity Tolerance Patient tolerated treatment well   Patient Left in bed;with call bell/phone within reach;with bed alarm set   Nurse Communication Mobility status (lip bleeding)        Time: 6644-0347 OT Time Calculation (min): 24 min  Charges: OT General Charges $OT Visit: 1 Visit OT Treatments $Self Care/Home Management : 8-22 mins $Therapeutic Activity: 8-22 mins  Nekita Pita, OTR/L [place]  Office [phone] Pager: [place] 02/10/2020, 4:38 PM

## 2020-02-11 ENCOUNTER — Telehealth: Payer: Self-pay | Admitting: *Deleted

## 2020-02-11 DIAGNOSIS — R7989 Other specified abnormal findings of blood chemistry: Secondary | ICD-10-CM | POA: Diagnosis not present

## 2020-02-11 DIAGNOSIS — Z7189 Other specified counseling: Secondary | ICD-10-CM

## 2020-02-11 DIAGNOSIS — J189 Pneumonia, unspecified organism: Secondary | ICD-10-CM

## 2020-02-11 DIAGNOSIS — C7951 Secondary malignant neoplasm of bone: Secondary | ICD-10-CM

## 2020-02-11 DIAGNOSIS — E876 Hypokalemia: Secondary | ICD-10-CM | POA: Diagnosis not present

## 2020-02-11 DIAGNOSIS — R9389 Abnormal findings on diagnostic imaging of other specified body structures: Secondary | ICD-10-CM

## 2020-02-11 DIAGNOSIS — C50919 Malignant neoplasm of unspecified site of unspecified female breast: Secondary | ICD-10-CM | POA: Diagnosis present

## 2020-02-11 DIAGNOSIS — Z87891 Personal history of nicotine dependence: Secondary | ICD-10-CM | POA: Diagnosis not present

## 2020-02-11 DIAGNOSIS — Z515 Encounter for palliative care: Secondary | ICD-10-CM

## 2020-02-11 DIAGNOSIS — J9601 Acute respiratory failure with hypoxia: Secondary | ICD-10-CM | POA: Diagnosis not present

## 2020-02-11 DIAGNOSIS — N179 Acute kidney failure, unspecified: Secondary | ICD-10-CM | POA: Diagnosis not present

## 2020-02-11 LAB — FERRITIN: Ferritin: 254 ng/mL (ref 11–307)

## 2020-02-11 LAB — IRON AND TIBC
Iron: 29 ug/dL (ref 28–170)
Saturation Ratios: 15 % (ref 10.4–31.8)
TIBC: 191 ug/dL — ABNORMAL LOW (ref 250–450)
UIBC: 162 ug/dL

## 2020-02-11 LAB — RETICULOCYTES
Immature Retic Fract: 23.3 % — ABNORMAL HIGH (ref 2.3–15.9)
RBC.: 3.36 MIL/uL — ABNORMAL LOW (ref 3.87–5.11)
Retic Count, Absolute: 39.3 10*3/uL (ref 19.0–186.0)
Retic Ct Pct: 1.2 % (ref 0.4–3.1)

## 2020-02-11 LAB — VITAMIN B12: Vitamin B-12: 634 pg/mL (ref 180–914)

## 2020-02-11 MED ORDER — ONDANSETRON HCL 4 MG/2ML IJ SOLN
4.0000 mg | Freq: Four times a day (QID) | INTRAMUSCULAR | Status: DC | PRN
  Administered 2020-02-11: 4 mg via INTRAVENOUS
  Filled 2020-02-11: qty 2

## 2020-02-11 NOTE — Telephone Encounter (Signed)
Message was given to [facility].

## 2020-02-11 NOTE — Telephone Encounter (Signed)
Received call from [facility]/Radiology NP/PA stating that BMBX ordered but earliest that they can do is Tues.  She wants to make sure that this is OK.  Her pager # is [phone].  Message to Dr Alvy Bimler.

## 2020-02-11 NOTE — Care Management Important Message (Signed)
Important Message  Patient Details IM Letter placed in door caddy to be given to the Patient Name: Kathleen Schneider MRN: [id number] Date of Birth: November 18, 1940   Medicare Important Message Given:  Yes     Kerin Salen 02/11/2020, 10:22 AM

## 2020-02-11 NOTE — Consult Note (Signed)
Chief Complaint: Patient was seen in consultation today for sclerotic bone lesions/bone marrow biopsy and aspiration.  Referring Physician(s): Heath Lark (oncology)  Supervising Physician: Jacqulynn Cadet  Patient Status: [facility] - In-pt  History of Present Illness: Kathleen Schneider is a 79 y.o. female with a past medical history of hypertension, CKD stage III, recurrent UTIs, and excessive daytime sleepiness. She presented to [facility] ED 02/03/2020 with complaints of dyspnea. In ED, patient met sepsis criteria secondary to pneumonia. She was admitted for further management. In addition, on admission complaining of hip/leg pain secondary to fall. DG bilateral hips was obtained which revealed numerous sclerotic bone lesions. NM bone scan was obtained for further evaluation which revealed scattered osseous metastases without known primary. Oncology was consulted who recommended IR consult for possible bone marrow biopsy/aspiration to evaluate for bone marrow involvement.  DG hips bilateral 02/07/2020: 1. Numerous sclerotic foci, as described above, which may represent sequelae associated with osseous metastasis. Correlation with a nuclear medicine bone scan is recommended. 2. Degenerative changes involving both hips.  NM bone scan whole body 02/09/2020: 1. Scattered osseous metastases. 2. Metastatic lesion involving proximal RIGHT femur and questionably mid RIGHT femoral diaphysis.  IR consulted by Dr. Alvy Bimler for possible image-guided bone marrow biopsy/aspiration. Patient laying in bed resting comfortably. She responds to voice and answers simple questions appropriately. No complaints. Denies fever, chills, chest pain, dyspnea, abdominal pain, or headache.    Past Medical History:  Diagnosis Date  . HTN (hypertension)   . Vision abnormalities     Past Surgical History:  Procedure Laterality Date  . CHOLECYSTECTOMY, LAPAROSCOPIC      Allergies: Patient has no known  allergies.  Medications: Prior to Admission medications   Medication Sig Start Date End Date Taking? Authorizing Provider  escitalopram (LEXAPRO) 5 MG tablet Take 5 mg by mouth daily.  11/27/19  Yes [provider]  lamoTRIgine (LAMICTAL) 150 MG tablet TAKE 1 TABLET BY MOUTH TWICE A DAY Patient taking differently: Take 150 mg by mouth 2 (two) times daily.  12/07/19  Yes Sater, Nanine Means, MD  lidocaine (LIDODERM) 5 % Place 1 patch onto the skin daily. Remove & Discard patch within 12 hours or as directed by MD 12/12/19  Yes Shelly Coss, MD  meloxicam (MOBIC) 7.5 MG tablet Take 7.5 mg by mouth daily.   Yes [provider]  oxybutynin (DITROPAN) 5 MG tablet TAKE 1 TABLET BY MOUTH TWICE A DAY Patient taking differently: Take 5 mg by mouth 2 (two) times daily.  11/23/19  Yes Sater, Nanine Means, MD  collagenase (SANTYL) ointment Apply topically daily. Patient not taking: Reported on 02/03/2020 12/12/19   Shelly Coss, MD     Family History  Problem Relation Age of Onset  . Seizures Mother   . Alcoholism Father     Social History   Socioeconomic History  . Marital status: Married    Spouse name: Not on file  . Number of children: Not on file  . Years of education: Not on file  . Highest education level: Not on file  Occupational History  . Not on file  Tobacco Use  . Smoking status: Former Research scientist (life sciences)  . Smokeless tobacco: Never Used  Substance and Sexual Activity  . Alcohol use: Yes    Alcohol/week: 0.0 standard drinks    Comment: Rare--about twice per yr/fim  . Drug use: No  . Sexual activity: Not on file  Other Topics Concern  . Not on file  Social History Narrative  .  Not on file   Social Determinants of Health   Financial Resource Strain:   . Difficulty of Paying Living Expenses: Not on file  Food Insecurity:   . Worried About Charity fundraiser in the Last Year: Not on file  . Ran Out of Food in the Last Year: Not on file  Transportation Needs:   .  Lack of Transportation (Medical): Not on file  . Lack of Transportation (Non-Medical): Not on file  Physical Activity:   . Days of Exercise per Week: Not on file  . Minutes of Exercise per Session: Not on file  Stress:   . Feeling of Stress : Not on file  Social Connections:   . Frequency of Communication with Friends and Family: Not on file  . Frequency of Social Gatherings with Friends and Family: Not on file  . Attends Religious Services: Not on file  . Active Member of Clubs or Organizations: Not on file  . Attends Archivist Meetings: Not on file  . Marital Status: Not on file     Review of Systems: A 12 point ROS discussed and pertinent positives are indicated in the HPI above.  All other systems are negative.  Review of Systems  Constitutional: Negative for chills and fever.  Respiratory: Negative for shortness of breath and wheezing.   Cardiovascular: Negative for chest pain and palpitations.  Gastrointestinal: Negative for abdominal pain.  Neurological: Negative for headaches.  Psychiatric/Behavioral: Negative for behavioral problems and confusion.    Vital Signs: BP (!) 102/55 (BP Location: Right Arm)   Pulse 74   Temp 97.9 F (36.6 C) (Oral)   Resp 16   Ht 5' (1.524 m)   Wt 185 lb 10 oz (84.2 kg)   SpO2 98%   BMI 36.25 kg/m   Physical Exam Vitals and nursing note reviewed.  Constitutional:      General: She is not in acute distress.    Appearance: Normal appearance.  Cardiovascular:     Rate and Rhythm: Normal rate and regular rhythm.     Heart sounds: Normal heart sounds. No murmur heard.   Pulmonary:     Effort: Pulmonary effort is normal. No respiratory distress.     Breath sounds: Normal breath sounds. No wheezing.  Skin:    General: Skin is warm and dry.  Neurological:     Mental Status: She is alert and oriented to person, place, and time.      MD Evaluation Airway: WNL Heart: WNL Abdomen: WNL Chest/ Lungs: WNL ASA   Classification: 3 Mallampati/Airway Score: Two   Imaging: NM Bone Scan Whole Body  Result Date: 02/09/2020 CLINICAL DATA:  Metastatic disease evaluation, sclerotic foci on pelvic radiographs several falls at home, BILATERAL leg pain, history hypertension EXAM: NUCLEAR MEDICINE WHOLE BODY BONE SCAN TECHNIQUE: Whole body anterior and posterior images were obtained approximately 3 hours after intravenous injection of radiopharmaceutical. RADIOPHARMACEUTICALS:  21.2 mCi Technetium-39mMDP IV COMPARISON:  None Radiographic correlation: LEFT hip and pelvic radiographs 02/07/2020 FINDINGS: Multiple foci of abnormal osseous tracer accumulation consistent with osseous metastases. These include calvarium, sternum, posterior ribs, thoracic and lumbar spine, pelvis, and proximal RIGHT femur. Question subtle metastatic lesion at mid RIGHT femoral diaphysis. Fairly symmetric uptake is seen at the humeral heads, proximal femora, knees, and ankles, nonspecific but symmetry could reflect marrow reconversion. Expected urinary tract and soft tissue distribution of tracer. IMPRESSION: Scattered osseous metastases. Metastatic lesion involving proximal RIGHT femur and questionably mid RIGHT femoral diaphysis. Electronically Signed  By: Lavonia Dana M.D.   On: 02/09/2020 14:38   CT CHEST ABDOMEN PELVIS W CONTRAST  Result Date: 02/11/2020 CLINICAL DATA:  Metastatic bone disease.  Cancer of unknown primary. EXAM: CT CHEST, ABDOMEN, AND PELVIS WITH CONTRAST TECHNIQUE: Multidetector CT imaging of the chest, abdomen and pelvis was performed following the standard protocol during bolus administration of intravenous contrast. CONTRAST:  35m OMNIPAQUE IOHEXOL 300 MG/ML  SOLN COMPARISON:  None. FINDINGS: CT CHEST FINDINGS Cardiovascular: The heart size is normal. No substantial pericardial effusion. Coronary artery calcification is evident. Atherosclerotic calcification is noted in the wall of the thoracic aorta. Mediastinum/Nodes:  No mediastinal lymphadenopathy. There is no hilar lymphadenopathy. The esophagus has normal imaging features. No right axillary lymphadenopathy. Lymph nodes in the left axilla are asymmetrically prominent but not enlarged by CT criteria. 8 mm short axis left subpectoral node visible on 18/2. 10 mm short axis left axillary node visible on 24/2. Lungs/Pleura: Diffuse bronchial wall thickening noted in both lungs with scattered areas of small airway impaction (well demonstrated left apex on 32/4, posterior right lower lobe on 78/4 and right middle lobe on 93/4). Associated tree-in-bud nodularity seen in the right middle lobe, anterior right upper lobe and posterior right upper lobe with 1 of the more dominant nodule seen in the peripheral right upper lobe on 53/4 measuring 5 mm. Tiny bilateral pleural effusions associated. Musculoskeletal: Innumerable sclerotic bone lesions are seen in the bony anatomy of the chest. CT ABDOMEN PELVIS FINDINGS Hepatobiliary: Small hypoattenuating lesions in the liver are too small to characterize and measure in the 5-10 mm size range. No overtly suspicious enhancing mass lesion in the hepatic parenchyma. Gallbladder surgically absent. No intrahepatic or extrahepatic biliary dilation. Pancreas: No focal mass lesion. No dilatation of the main duct. No intraparenchymal cyst. No peripancreatic edema. Spleen: No splenomegaly. No focal mass lesion. Adrenals/Urinary Tract: No adrenal nodule or mass. Right kidney unremarkable. There is marked left-sided hydronephrosis with areas of cortical thinning overlying the dilated calices. 6 mm stone noted lower pole with 2 mm interpolar left renal stone. There is substantial perinephric edema/stranding. Potential tiny calcification in the region of the UPJ (coronal 78/5) with no dilatation of the proximal left ureter left ureter is nondilated but appears ill-defined. The urinary bladder appears normal for the degree of distention. Stomach/Bowel: Stomach  is unremarkable. No gastric wall thickening. No evidence of outlet obstruction. Duodenum is normally positioned as is the ligament of Treitz. Duodenal diverticulum noted. No small bowel wall thickening. No small bowel dilatation. The terminal ileum is normal. The appendix is normal. No gross colonic mass. No colonic wall thickening. Diverticular changes are noted in the left colon without evidence of diverticulitis. Vascular/Lymphatic: There is abdominal aortic atherosclerosis without aneurysm. No gastrohepatic or hepatoduodenal ligament lymphadenopathy. Clustered small lymph nodes are seen in the left para-aortic space at the level of the left renal vein. Left renal vein appears attenuated. No pelvic sidewall lymphadenopathy. Reproductive: Uterus unremarkable. No adnexal mass. Prominent volume fluid noted in the vagina. Other: There is some trace free fluid in the pelvis along the dome of the bladder. Musculoskeletal: Innumerable sclerotic bone lesions are seen in the lumbar spine and bony pelvis. IMPRESSION: 1. Innumerable sclerotic bone lesions in the bony anatomy of the chest, abdomen, and pelvis, consistent with metastatic disease. 2. Left axillary and subpectoral lymph nodes are asymmetrically prominent but not enlarged by CT criteria. 3. Marked left-sided hydronephrosis with areas of cortical thinning overlying the dilated calices. Potential tiny calcification in the region  of the left UPJ with no dilatation of the proximal left ureter. Etiology of the left hydronephrosis not evident by CT although urothelial lesion cannot be excluded. 4. Left nephrolithiasis. 5. Diffuse bronchial wall thickening bilaterally with scattered areas of small airway impaction and tree-in-bud nodularity. Imaging features likely related to infectious/inflammatory etiology. Atypical infection (including MAI) would be a consideration. 6. Small hypoattenuating lesions in the liver are too small to characterize. Attention on follow-up  recommended. These are likely cysts, but MRI of the abdomen with and without contrast could be used to confirm as clinically warranted. 7. Prominent volume fluid in the vagina of indeterminate etiology. No fluid or thickening of the endometrial canal in the uterus. 8. Aortic Atherosclerosis (ICD10-I70.0). Electronically Signed   By: Misty Stanley M.D.   On: 02/11/2020 06:19   DG CHEST PORT 1 VIEW  Result Date: 02/07/2020 CLINICAL DATA:  New onset shortness of breath EXAM: PORTABLE CHEST 1 VIEW COMPARISON:  Three days ago FINDINGS: Large lung volumes with diaphragm flattening. Trace pleural effusions and generalized reticulation that is stable. Normal heart size for technique. No pneumothorax. IMPRESSION: 1. Trace pleural effusions with mild scarring or atelectasis at the left base. 2. Stable interstitial prominence, favored bronchitic Electronically Signed   By: Monte Fantasia M.D.   On: 02/07/2020 08:46   DG CHEST PORT 1 VIEW  Result Date: 02/04/2020 CLINICAL DATA:  Shortness of breath. EXAM: PORTABLE CHEST 1 VIEW COMPARISON:  February 03, 2020 FINDINGS: Mild, stable, diffuse chronic appearing increased interstitial lung markings are seen. Mild, stable atelectasis is noted within the lateral aspect of the left lung base. There is no evidence of a pleural effusion or pneumothorax. The heart size and mediastinal contours are within normal limits. Radiopaque and lucent artifact is seen overlying the lateral aspect of the mid left chest wall. The visualized skeletal structures are otherwise unremarkable. IMPRESSION: Mild, stable left basilar atelectasis. Electronically Signed   By: Virgina Norfolk M.D.   On: 02/04/2020 20:00   DG Chest Port 1 View  Result Date: 02/03/2020 CLINICAL DATA:  Shortness of breath and cough for 1 month EXAM: PORTABLE CHEST 1 VIEW COMPARISON:  None. FINDINGS: Cardiac shadow is at the upper limits of normal in size. Aortic calcifications are seen. The lungs are well aerated  bilaterally with mild left basilar atelectasis/early infiltrate. No bony abnormality is noted. IMPRESSION: Left basilar opacity consistent with atelectasis/infiltrate. Electronically Signed   By: Inez Catalina M.D.   On: 02/03/2020 20:06   ECHOCARDIOGRAM COMPLETE  Result Date: 02/05/2020    ECHOCARDIOGRAM REPORT   Patient Name:   DARTHY MANGANELLI Date of Exam: 02/04/2020 Medical Rec #:  [id number]       Height:       60.0 in Accession #:    [id number]      Weight:       185.6 lb Date of Birth:  01-Jun-1940       BSA:          1.808 m Patient Age:    24 years        BP:           124/83 mmHg Patient Gender: F               HR:           76 bpm. Exam Location:  Inpatient Procedure: 2D Echo Indications:    786.09 dyspnea  History:        Patient has no prior history of Echocardiogram  examinations.                 Risk Factors:Former Smoker and Hypertension.  Sonographer:    Jannett Celestine RDCS (AE) Referring Phys: [id number] Julious Oka  Sonographer Comments: Image acquisition challenging due to patient body habitus. IMPRESSIONS  1. Left ventricular ejection fraction, by estimation, is 65 to 70%. The left ventricle has normal function. The left ventricle has no regional wall motion abnormalities. Left ventricular diastolic parameters are consistent with Grade I diastolic dysfunction (impaired relaxation). Elevated left atrial pressure.  2. Right ventricular systolic function is normal. The right ventricular size is normal. There is normal pulmonary artery systolic pressure.  3. The mitral valve is normal in structure. Mild mitral valve regurgitation. No evidence of mitral stenosis.  4. The aortic valve is normal in structure. Aortic valve regurgitation is not visualized. Mild to moderate aortic valve sclerosis/calcification is present, without any evidence of aortic stenosis.  5. The inferior vena cava is normal in size with greater than 50% respiratory variability, suggesting right atrial pressure of 3 mmHg.  Comparison(s): No prior Echocardiogram. FINDINGS  Left Ventricle: Left ventricular ejection fraction, by estimation, is 65 to 70%. The left ventricle has normal function. The left ventricle has no regional wall motion abnormalities. The left ventricular internal cavity size was normal in size. There is  no left ventricular hypertrophy. Left ventricular diastolic parameters are consistent with Grade I diastolic dysfunction (impaired relaxation). Elevated left atrial pressure. Right Ventricle: The right ventricular size is normal. No increase in right ventricular wall thickness. Right ventricular systolic function is normal. There is normal pulmonary artery systolic pressure. Left Atrium: Left atrial size was normal in size. Right Atrium: Right atrial size was normal in size. Pericardium: There is no evidence of pericardial effusion. Mitral Valve: The mitral valve is normal in structure. Mild mitral valve regurgitation. No evidence of mitral valve stenosis. Tricuspid Valve: The tricuspid valve is normal in structure. Tricuspid valve regurgitation is trivial. No evidence of tricuspid stenosis. Aortic Valve: The aortic valve is normal in structure. Aortic valve regurgitation is not visualized. Mild to moderate aortic valve sclerosis/calcification is present, without any evidence of aortic stenosis. Pulmonic Valve: The pulmonic valve was normal in structure. Pulmonic valve regurgitation is not visualized. No evidence of pulmonic stenosis. Aorta: The aortic root is normal in size and structure. Venous: The inferior vena cava is normal in size with greater than 50% respiratory variability, suggesting right atrial pressure of 3 mmHg. IAS/Shunts: No atrial level shunt detected by color flow Doppler.  LEFT VENTRICLE PLAX 2D LVIDd:         5.07 cm  Diastology LVIDs:         2.88 cm  LV e' medial:    8.38 cm/s LV PW:         0.85 cm  LV E/e' medial:  11.8 LV IVS:        0.90 cm  LV e' lateral:   8.05 cm/s LVOT diam:     2.20 cm   LV E/e' lateral: 12.2 LV SV:         97 LV SV Index:   54 LVOT Area:     3.80 cm  RIGHT VENTRICLE RV S prime:     11.10 cm/s TAPSE (M-mode): 1.8 cm LEFT ATRIUM           Index LA diam:      4.00 cm 2.21 cm/m LA Vol (A2C): 55.8 ml 30.86 ml/m  AORTIC VALVE LVOT  Vmax:   99.80 cm/s LVOT Vmean:  65.100 cm/s LVOT VTI:    0.256 m  AORTA Ao Root diam: 2.70 cm MITRAL VALVE MV Area (PHT): 3.27 cm    SHUNTS MV Decel Time: 232 msec    Systemic VTI:  0.26 m MV E velocity: 98.50 cm/s  Systemic Diam: 2.20 cm MV A velocity: 88.30 cm/s MV E/A ratio:  1.12 Ena Dawley MD Electronically signed by Ena Dawley MD Signature Date/Time: 02/05/2020/9:18:19 AM    Final    DG HIPS BILAT WITH PELVIS 3-4 VIEWS  Result Date: 02/07/2020 CLINICAL DATA:  Increased weakness and shortness of breath. EXAM: DG HIP (WITH OR WITHOUT PELVIS) 3-4V BILAT COMPARISON:  None. FINDINGS: There is no evidence of an acute hip fracture or dislocation. Mild degenerative changes seen involving both hips, in the form of joint space narrowing and acetabular sclerosis. Numerous ill-defined sclerotic foci are seen scattered throughout the pelvis and bilateral lower extremities. IMPRESSION: 1. Numerous sclerotic foci, as described above, which may represent sequelae associated with osseous metastasis. Correlation with a nuclear medicine bone scan is recommended. 2. Degenerative changes involving both hips. Electronically Signed   By: Virgina Norfolk M.D.   On: 02/07/2020 16:12    Labs:  CBC: Recent Labs    02/05/20 0417 02/06/20 0408 02/07/20 0321 02/08/20 0418  WBC 27.6* 15.4* 11.5* 10.6*  HGB 8.4* 8.5* 8.9* 9.7*  HCT 26.2* 27.2* 28.3* 30.8*  PLT 167 152 171 204    COAGS: No results for input(s): INR, APTT in the last 8760 hours.  BMP: Recent Labs    12/09/19 0456 12/09/19 0456 12/10/19 0604 12/10/19 0604 12/11/19 0538 02/03/20 1820 02/05/20 0417 02/05/20 0417 02/06/20 0408 02/07/20 0321 02/08/20 0418 02/10/20 0506    NA 145   < > 138   < > 142   < > 132*  --  137 136 135  --   K 4.9   < > 3.9   < > 4.6   < > 3.5  --  3.0* 3.8 4.4  --   CL 109   < > 108   < > 112*   < > 101  --  105 105 97*  --   CO2 21*   < > 22   < > 23   < > 22  --  22 24 28   --   GLUCOSE 112*   < > 97   < > 114*   < > 190*  --  146* 103* 89  --   BUN 57*   < > 32*   < > 22   < > 41*  --  37* 26* 23  --   CALCIUM 9.3   < > 8.2*   < > 8.6*   < > 8.0*  --  8.4* 8.1* 8.6*  --   CREATININE 2.30*   < > 1.56*   < > 1.67*   < > 1.89*   < > 1.54* 1.60* 1.62* 1.45*  GFRNONAA 19*   < > 31*   < > 29*   < > 27*   < > 34* 32* 32* 36*  GFRAA 23*  --  36*  --  33*  --   --   --   --   --   --   --    < > = values in this interval not displayed.    LIVER FUNCTION TESTS: Recent Labs    02/05/20 0417 02/06/20 0408 02/07/20 0321  02/08/20 0418  BILITOT 0.6 0.5 0.3 0.5  AST 17 21 18  14*  ALT 16 20 19 17   ALKPHOS 134* 120 105 111  PROT 5.7* 5.6* 5.5* 6.3*  ALBUMIN 2.2* 2.2* 2.1* 2.5*     Assessment and Plan:  Diffuse sclerotic bone lesions concerning for metastatic disease without known origin. Plan for image-guided bone marrow biopsy/aspiration (to rule out bone marrow involvement) in IR tentatively for tomorrow pending IR scheduling. Patient will be NPO at midnight. Afebrile. CBC with differential ordered for tomorrow AM.  Risks and benefits discussed with the patient including, but not limited to bleeding, infection, damage to adjacent structures or low yield requiring additional tests. All of the patient's questions were answered, patient is agreeable to proceed. Consent signed and in chart.   Thank you for this interesting consult.  I greatly enjoyed meeting SWATI GRANBERRY and look forward to participating in their care.  A copy of this report was sent to the requesting provider on this date.  Electronically Signed: Earley Abide, PA-C 02/11/2020, 3:47 PM   I spent a total of 20 Minutes in face to face in clinical  consultation, greater than 50% of which was counseling/coordinating care for sclerotic bone lesions/bone marrow biopsy and aspiration.

## 2020-02-11 NOTE — Consult Note (Addendum)
Urology Consult   Physician requesting consult: Vance Gather  Reason for consult: Hydronephrosis  History of Present Illness: Kathleen Schneider is a 79 y.o. woman with history of HTN, CKD rUTI who presented with progressively worsening SOB and weakness who was admitted for treatment of PNA but was found to have diffuse sclerotic lesions concerning for metastatic ca as well as right hydronephrosis on CT imaging. UA several days ago with bacteria, small WBC and trace LE bu negative nitrites. She denies any recent issues with frequency, urgency, pain wit urination but does report urinary leakage at baseline. She denies flank pain or abdominal pain. She has had many UTIs but denies history of  urolithiasis, GU malignancy/trauma/surgery. She is currently HDS, afebrile, WBC mildly elevated to 11, Cr at 1.45 from 1.9 6 days ago and >2.5 a few weeks ago.   Past Medical History:  Diagnosis Date   HTN (hypertension)    Vision abnormalities     Past Surgical History:  Procedure Laterality Date   CHOLECYSTECTOMY, LAPAROSCOPIC       Current Hospital Medications:  Home meds:  No current facility-administered medications on file prior to encounter.   Current Outpatient Medications on File Prior to Encounter  Medication Sig Dispense Refill   escitalopram (LEXAPRO) 5 MG tablet Take 5 mg by mouth daily.      lamoTRIgine (LAMICTAL) 150 MG tablet TAKE 1 TABLET BY MOUTH TWICE A DAY (Patient taking differently: Take 150 mg by mouth 2 (two) times daily. ) 60 tablet 1   lidocaine (LIDODERM) 5 % Place 1 patch onto the skin daily. Remove & Discard patch within 12 hours or as directed by MD 30 patch 0   meloxicam (MOBIC) 7.5 MG tablet Take 7.5 mg by mouth daily.     oxybutynin (DITROPAN) 5 MG tablet TAKE 1 TABLET BY MOUTH TWICE A DAY (Patient taking differently: Take 5 mg by mouth 2 (two) times daily. ) 180 tablet 0   collagenase (SANTYL) ointment Apply topically daily. (Patient not taking: Reported on 02/03/2020)  15 g 0     Scheduled Meds:  enoxaparin (LOVENOX) injection  30 mg Subcutaneous Q24H   escitalopram  5 mg Oral Daily   lamoTRIgine  150 mg Oral BID   lidocaine  2 patch Transdermal Q24H   oxybutynin  5 mg Oral BID   Continuous Infusions:  sodium chloride     PRN Meds:.sodium chloride, acetaminophen, ipratropium-albuterol, ondansetron (ZOFRAN) IV  Allergies: No Known Allergies  Family History  Problem Relation Age of Onset   Seizures Mother    Alcoholism Father     Social History:  reports that she has quit smoking. She has never used smokeless tobacco. She reports current alcohol use. She reports that she does not use drugs.  ROS: A complete review of systems was performed.  All systems are negative except for pertinent findings as noted.  Physical Exam:  Vital signs in last 24 hours: Temp:  [98.2 F (36.8 C)-99.1 F (37.3 C)] 98.5 F (36.9 C) (10/28 0912) Pulse Rate:  [74-78] 78 (10/28 0431) Resp:  [19-20] 20 (10/28 0431) BP: (103-128)/(59-69) 128/69 (10/28 0912) SpO2:  [93 %-99 %] 93 % (10/28 0431) Constitutional:  Alert and oriented, No acute distress Cardiovascular: Regular rate and rhythm, No JVD Respiratory: Normal respiratory effort, Lungs clear bilaterally GI: Abdomen is soft, nontender, nondistended, no abdominal masses GU: No CVA tenderness Lymphatic: No lymphadenopathy Neurologic: Grossly intact, no focal deficits Psychiatric: Normal mood and affect  Laboratory Data:  No results  for input(s): WBC, HGB, HCT, PLT in the last 72 hours.  Recent Labs    02/10/20 0506  CREATININE 1.45*     Results for orders placed or performed during the hospital encounter of 02/03/20 (from the past 24 hour(s))  Iron and TIBC     Status: Abnormal   Collection Time: 02/11/20  3:11 AM  Result Value Ref Range   Iron 29 28 - 170 ug/dL   TIBC 191 (L) 250 - 450 ug/dL   Saturation Ratios 15 10.4 - 31.8 %   UIBC 162 ug/dL  Ferritin     Status: None   Collection Time:  02/11/20  3:11 AM  Result Value Ref Range   Ferritin 254 11 - 307 ng/mL  Vitamin B12     Status: None   Collection Time: 02/11/20  3:11 AM  Result Value Ref Range   Vitamin B-12 634 180 - 914 pg/mL  Reticulocytes     Status: Abnormal   Collection Time: 02/11/20  3:11 AM  Result Value Ref Range   Retic Ct Pct 1.2 0.4 - 3.1 %   RBC. 3.36 (L) 3.87 - 5.11 MIL/uL   Retic Count, Absolute 39.3 19.0 - 186.0 K/uL   Immature Retic Fract 23.3 (H) 2.3 - 15.9 %   Recent Results (from the past 240 hour(s))  Resp Panel by RT PCR (RSV, Flu A&B, Covid) - Nasopharyngeal Swab     Status: None   Collection Time: 02/03/20  6:25 PM   Specimen: Nasopharyngeal Swab  Result Value Ref Range Status   SARS Coronavirus 2 by RT PCR NEGATIVE NEGATIVE Final    Comment: (NOTE) SARS-CoV-2 target nucleic acids are NOT DETECTED.  The SARS-CoV-2 RNA is generally detectable in upper respiratoy specimens during the acute phase of infection. The lowest concentration of SARS-CoV-2 viral copies this assay can detect is 131 copies/mL. A negative result does not preclude SARS-Cov-2 infection and should not be used as the sole basis for treatment or other patient management decisions. A negative result may occur with  improper specimen collection/handling, submission of specimen other than nasopharyngeal swab, presence of viral mutation(s) within the areas targeted by this assay, and inadequate number of viral copies (<131 copies/mL). A negative result must be combined with clinical observations, patient history, and epidemiological information. The expected result is Negative.  Fact Sheet for Patients:  PinkCheek.be  Fact Sheet for Healthcare Providers:  GravelBags.it  This test is no t yet approved or cleared by the [place] FDA and  has been authorized for detection and/or diagnosis of SARS-CoV-2 by FDA under an Emergency Use Authorization (EUA). This  EUA will remain  in effect (meaning this test can be used) for the duration of the COVID-19 declaration under Section 564(b)(1) of the Act, 21 U.S.C. section 360bbb-3(b)(1), unless the authorization is terminated or revoked sooner.     Influenza A by PCR NEGATIVE NEGATIVE Final   Influenza B by PCR NEGATIVE NEGATIVE Final    Comment: (NOTE) The Xpert Xpress SARS-CoV-2/FLU/RSV assay is intended as an aid in  the diagnosis of influenza from Nasopharyngeal swab specimens and  should not be used as a sole basis for treatment. Nasal washings and  aspirates are unacceptable for Xpert Xpress SARS-CoV-2/FLU/RSV  testing.  Fact Sheet for Patients: PinkCheek.be  Fact Sheet for Healthcare Providers: GravelBags.it  This test is not yet approved or cleared by the [place] FDA and  has been authorized for detection and/or diagnosis of SARS-CoV-2 by  FDA under an  Emergency Use Authorization (EUA). This EUA will remain  in effect (meaning this test can be used) for the duration of the  Covid-19 declaration under Section 564(b)(1) of the Act, 21  U.S.C. section 360bbb-3(b)(1), unless the authorization is  terminated or revoked.    Respiratory Syncytial Virus by PCR NEGATIVE NEGATIVE Final    Comment: (NOTE) Fact Sheet for Patients: PinkCheek.be  Fact Sheet for Healthcare Providers: GravelBags.it  This test is not yet approved or cleared by the [place] FDA and  has been authorized for detection and/or diagnosis of SARS-CoV-2 by  FDA under an Emergency Use Authorization (EUA). This EUA will remain  in effect (meaning this test can be used) for the duration of the  COVID-19 declaration under Section 564(b)(1) of the Act, 21 U.S.C.  section 360bbb-3(b)(1), unless the authorization is terminated or  revoked. Performed at [facility], [place] [address] [place], [place] [address]   Culture, blood (routine x 2) Call MD if unable to obtain prior to antibiotics being given     Status: Abnormal   Collection Time: 02/04/20  6:45 AM   Specimen: BLOOD  Result Value Ref Range Status   Specimen Description   Final    BLOOD LEFT ANTECUBITAL Performed at [facility] [address], [place], [place] [address]    Special Requests   Final    BOTTLES DRAWN AEROBIC AND ANAEROBIC Blood Culture adequate volume Performed at [facility] [address], [place],  [address]    Culture  Setup Time   Final    AEROBIC BOTTLE ONLY GRAM POSITIVE COCCI Organism ID to follow CRITICAL RESULT CALLED TO, READ BACK BY AND VERIFIED WITH: E JACKSON PHARMD 02/06/20 0210 JDW    Culture (A)  Final    STAPHYLOCOCCUS EPIDERMIDIS THE SIGNIFICANCE OF ISOLATING THIS ORGANISM FROM A SINGLE SET OF BLOOD CULTURES WHEN MULTIPLE SETS ARE DRAWN IS UNCERTAIN. PLEASE NOTIFY THE MICROBIOLOGY DEPARTMENT WITHIN ONE WEEK IF SPECIATION AND SENSITIVITIES ARE REQUIRED. Performed at [facility], [place] [address], [place],  [address]    Report Status 02/06/2020 FINAL  Final  Blood Culture ID Panel (Reflexed)     Status: Abnormal   Collection Time: 02/04/20  6:45 AM  Result Value Ref Range Status   Enterococcus faecalis NOT DETECTED NOT DETECTED Final   Enterococcus Faecium NOT DETECTED NOT DETECTED Final   Listeria monocytogenes NOT DETECTED NOT DETECTED Final   Staphylococcus species DETECTED (A) NOT DETECTED Final    Comment: CRITICAL RESULT CALLED TO, READ BACK BY AND VERIFIED WITH: E JACKSON PHARMD 02/06/20 0210 JDW    Staphylococcus aureus (BCID) NOT DETECTED NOT DETECTED Final   Staphylococcus epidermidis DETECTED (A) NOT DETECTED Final    Comment: Methicillin (oxacillin) resistant coagulase negative staphylococcus. Possible blood culture contaminant (unless isolated from more than one blood culture draw or clinical case  suggests pathogenicity). No antibiotic treatment is indicated for blood  culture contaminants. CRITICAL RESULT CALLED TO, READ BACK BY AND VERIFIED WITH: E JACKSON PHARMD 02/06/20 0210 JDW    Staphylococcus lugdunensis NOT DETECTED NOT DETECTED Final   Streptococcus species NOT DETECTED NOT DETECTED Final   Streptococcus agalactiae NOT DETECTED NOT DETECTED Final   Streptococcus pneumoniae NOT DETECTED NOT DETECTED Final   Streptococcus pyogenes NOT DETECTED NOT DETECTED Final   A.calcoaceticus-baumannii NOT DETECTED NOT DETECTED Final   Bacteroides fragilis NOT DETECTED NOT DETECTED Final   Enterobacterales NOT DETECTED NOT DETECTED Final   Enterobacter cloacae  complex NOT DETECTED NOT DETECTED Final   Escherichia coli NOT DETECTED NOT DETECTED Final   Klebsiella aerogenes NOT DETECTED NOT DETECTED Final   Klebsiella oxytoca NOT DETECTED NOT DETECTED Final   Klebsiella pneumoniae NOT DETECTED NOT DETECTED Final   Proteus species NOT DETECTED NOT DETECTED Final   Salmonella species NOT DETECTED NOT DETECTED Final   Serratia marcescens NOT DETECTED NOT DETECTED Final   Haemophilus influenzae NOT DETECTED NOT DETECTED Final   Neisseria meningitidis NOT DETECTED NOT DETECTED Final   Pseudomonas aeruginosa NOT DETECTED NOT DETECTED Final   Stenotrophomonas maltophilia NOT DETECTED NOT DETECTED Final   Candida albicans NOT DETECTED NOT DETECTED Final   Candida auris NOT DETECTED NOT DETECTED Final   Candida glabrata NOT DETECTED NOT DETECTED Final   Candida krusei NOT DETECTED NOT DETECTED Final   Candida parapsilosis NOT DETECTED NOT DETECTED Final   Candida tropicalis NOT DETECTED NOT DETECTED Final   Cryptococcus neoformans/gattii NOT DETECTED NOT DETECTED Final   Methicillin resistance mecA/C DETECTED (A) NOT DETECTED Final    Comment: CRITICAL RESULT CALLED TO, READ BACK BY AND VERIFIED WITHSeleta Rhymes [facility] 02/06/20 0210 JDW Performed at [facility], [address]  [place], [place] [address]   Culture, blood (routine x 2) Call MD if unable to obtain prior to antibiotics being given     Status: None   Collection Time: 02/04/20  7:10 AM   Specimen: BLOOD RIGHT HAND  Result Value Ref Range Status   Specimen Description   Final    BLOOD RIGHT HAND Performed at [facility] [address] [place], [place] [address]    Special Requests   Final    BOTTLES DRAWN AEROBIC AND ANAEROBIC Blood Culture adequate volume Performed at [facility] [address] [place], [place] [address]    Culture   Final    NO GROWTH 5 DAYS Performed at [facility], [place] [address] [place], [place] [address]    Report Status 02/09/2020 FINAL  Final  MRSA PCR Screening     Status: None   Collection Time: 02/05/20  1:37 PM   Specimen: Nasal Mucosa; Nasopharyngeal  Result Value Ref Range Status   MRSA by PCR NEGATIVE NEGATIVE Final    Comment:        The GeneXpert MRSA Assay (FDA approved for NASAL specimens only), is one component of a comprehensive MRSA colonization surveillance program. It is not intended to diagnose MRSA infection nor to guide or monitor treatment for MRSA infections. Performed at [facility], [place] [address] [place], [place] [address]   SARS Coronavirus 2 by RT PCR (hospital order, performed in [facility] hospital lab) Nasopharyngeal Nasopharyngeal Swab     Status: None   Collection Time: 02/08/20  4:25 PM   Specimen: Nasopharyngeal Swab  Result Value Ref Range Status   SARS Coronavirus 2 NEGATIVE NEGATIVE Final    Comment: (NOTE) SARS-CoV-2 target nucleic acids are NOT DETECTED.  The SARS-CoV-2 RNA is generally detectable in upper and lower respiratory specimens during the acute phase of infection. The lowest concentration of SARS-CoV-2 viral copies this assay can detect is 250 copies / mL. A negative result does not preclude SARS-CoV-2 infection and should not be used as the  sole basis for treatment or other patient management decisions.  A negative result may occur with improper specimen collection / handling, submission of specimen other than nasopharyngeal swab, presence of viral mutation(s) within the areas  targeted by this assay, and inadequate number of viral copies (<250 copies / mL). A negative result must be combined with clinical observations, patient history, and epidemiological information.  Fact Sheet for Patients:   StrictlyIdeas.no  Fact Sheet for Healthcare Providers: BankingDealers.co.za  This test is not yet approved or  cleared by the [place] FDA and has been authorized for detection and/or diagnosis of SARS-CoV-2 by FDA under an Emergency Use Authorization (EUA).  This EUA will remain in effect (meaning this test can be used) for the duration of the COVID-19 declaration under Section 564(b)(1) of the Act, 21 U.S.C. section 360bbb-3(b)(1), unless the authorization is terminated or revoked sooner.  Performed at [facility], [place] [address] [place], [place] [address]     Renal Function: Recent Labs    02/04/20 2035 02/05/20 0417 02/06/20 0408 02/07/20 0321 02/08/20 0418 02/10/20 0506  CREATININE 1.70* 1.89* 1.54* 1.60* 1.62* 1.45*   Estimated Creatinine Clearance: 29.8 mL/min (A) (by C-G formula based on SCr of 1.45 mg/dL (H)).  Radiologic Imaging: NM Bone Scan Whole Body  Result Date: 02/09/2020 CLINICAL DATA:  Metastatic disease evaluation, sclerotic foci on pelvic radiographs several falls at home, BILATERAL leg pain, history hypertension EXAM: NUCLEAR MEDICINE WHOLE BODY BONE SCAN TECHNIQUE: Whole body anterior and posterior images were obtained approximately 3 hours after intravenous injection of radiopharmaceutical. RADIOPHARMACEUTICALS:  21.2 mCi Technetium-44m MDP IV COMPARISON:  None Radiographic correlation: LEFT hip and pelvic radiographs  02/07/2020 FINDINGS: Multiple foci of abnormal osseous tracer accumulation consistent with osseous metastases. These include calvarium, sternum, posterior ribs, thoracic and lumbar spine, pelvis, and proximal RIGHT femur. Question subtle metastatic lesion at mid RIGHT femoral diaphysis. Fairly symmetric uptake is seen at the humeral heads, proximal femora, knees, and ankles, nonspecific but symmetry could reflect marrow reconversion. Expected urinary tract and soft tissue distribution of tracer. IMPRESSION: Scattered osseous metastases. Metastatic lesion involving proximal RIGHT femur and questionably mid RIGHT femoral diaphysis. Electronically Signed   By: Lavonia Dana M.D.   On: 02/09/2020 14:38   CT CHEST ABDOMEN PELVIS W CONTRAST  Result Date: 02/11/2020 CLINICAL DATA:  Metastatic bone disease.  Cancer of unknown primary. EXAM: CT CHEST, ABDOMEN, AND PELVIS WITH CONTRAST TECHNIQUE: Multidetector CT imaging of the chest, abdomen and pelvis was performed following the standard protocol during bolus administration of intravenous contrast. CONTRAST:  32mL OMNIPAQUE IOHEXOL 300 MG/ML  SOLN COMPARISON:  None. FINDINGS: CT CHEST FINDINGS Cardiovascular: The heart size is normal. No substantial pericardial effusion. Coronary artery calcification is evident. Atherosclerotic calcification is noted in the wall of the thoracic aorta. Mediastinum/Nodes: No mediastinal lymphadenopathy. There is no hilar lymphadenopathy. The esophagus has normal imaging features. No right axillary lymphadenopathy. Lymph nodes in the left axilla are asymmetrically prominent but not enlarged by CT criteria. 8 mm short axis left subpectoral node visible on 18/2. 10 mm short axis left axillary node visible on 24/2. Lungs/Pleura: Diffuse bronchial wall thickening noted in both lungs with scattered areas of small airway impaction (well demonstrated left apex on 32/4, posterior right lower lobe on 78/4 and right middle lobe on 93/4). Associated  tree-in-bud nodularity seen in the right middle lobe, anterior right upper lobe and posterior right upper lobe with 1 of the more dominant nodule seen in the peripheral right upper lobe on 53/4 measuring 5 mm. Tiny bilateral pleural effusions associated. Musculoskeletal: Innumerable sclerotic bone lesions are seen in the bony anatomy of the chest. CT ABDOMEN PELVIS FINDINGS Hepatobiliary: Small hypoattenuating lesions in the liver are  too small to characterize and measure in the 5-10 mm size range. No overtly suspicious enhancing mass lesion in the hepatic parenchyma. Gallbladder surgically absent. No intrahepatic or extrahepatic biliary dilation. Pancreas: No focal mass lesion. No dilatation of the main duct. No intraparenchymal cyst. No peripancreatic edema. Spleen: No splenomegaly. No focal mass lesion. Adrenals/Urinary Tract: No adrenal nodule or mass. Right kidney unremarkable. There is marked left-sided hydronephrosis with areas of cortical thinning overlying the dilated calices. 6 mm stone noted lower pole with 2 mm interpolar left renal stone. There is substantial perinephric edema/stranding. Potential tiny calcification in the region of the UPJ (coronal 78/5) with no dilatation of the proximal left ureter left ureter is nondilated but appears ill-defined. The urinary bladder appears normal for the degree of distention. Stomach/Bowel: Stomach is unremarkable. No gastric wall thickening. No evidence of outlet obstruction. Duodenum is normally positioned as is the ligament of Treitz. Duodenal diverticulum noted. No small bowel wall thickening. No small bowel dilatation. The terminal ileum is normal. The appendix is normal. No gross colonic mass. No colonic wall thickening. Diverticular changes are noted in the left colon without evidence of diverticulitis. Vascular/Lymphatic: There is abdominal aortic atherosclerosis without aneurysm. No gastrohepatic or hepatoduodenal ligament lymphadenopathy. Clustered small  lymph nodes are seen in the left para-aortic space at the level of the left renal vein. Left renal vein appears attenuated. No pelvic sidewall lymphadenopathy. Reproductive: Uterus unremarkable. No adnexal mass. Prominent volume fluid noted in the vagina. Other: There is some trace free fluid in the pelvis along the dome of the bladder. Musculoskeletal: Innumerable sclerotic bone lesions are seen in the lumbar spine and bony pelvis. IMPRESSION: 1. Innumerable sclerotic bone lesions in the bony anatomy of the chest, abdomen, and pelvis, consistent with metastatic disease. 2. Left axillary and subpectoral lymph nodes are asymmetrically prominent but not enlarged by CT criteria. 3. Marked left-sided hydronephrosis with areas of cortical thinning overlying the dilated calices. Potential tiny calcification in the region of the left UPJ with no dilatation of the proximal left ureter. Etiology of the left hydronephrosis not evident by CT although urothelial lesion cannot be excluded. 4. Left nephrolithiasis. 5. Diffuse bronchial wall thickening bilaterally with scattered areas of small airway impaction and tree-in-bud nodularity. Imaging features likely related to infectious/inflammatory etiology. Atypical infection (including MAI) would be a consideration. 6. Small hypoattenuating lesions in the liver are too small to characterize. Attention on follow-up recommended. These are likely cysts, but MRI of the abdomen with and without contrast could be used to confirm as clinically warranted. 7. Prominent volume fluid in the vagina of indeterminate etiology. No fluid or thickening of the endometrial canal in the uterus. 8. Aortic Atherosclerosis (ICD10-I70.0). Electronically Signed   By: Misty Stanley M.D.   On: 02/11/2020 06:19    I independently reviewed the above imaging studies.  Assessment/Recommendation: 79yo F with likely chronic hydronephrosis of unknown etiology  - No acute urologic intervention recommended at  this time. The patient has L moderate/large hydronephrosis and minimal R hydronephrosis which was seen on RUS a few days ago and confirmed on CT but there is cortical thinning present on imaging, patient is asymptomatic and there is relatively improved Cr suggesting a more chronic rather than acute process. In the setting of possible malignancy, obstruction/stricture of ureter could be cause of hydronephrosis, however work up is ongoing and in the setting of possible palliative care, would not routinely recommend invasive decompression.  - If patient becomes symptomatic or there is concern for active  UTI in the setting of ureteral obstruction, would recommend more urgent decompression with ureteral stent or nephrostomy tube, however urology should be re-consulted for discussion of plan at that time.  - Agree with work up of malignancy - Continue to trend Cr   Alysen Demzik 02/11/2020, 11:53 AM   `Addendum, I have reviewed chart, reviewed CT and history, Exam.agree with plan for no current urologic intervention unless symptomatic.

## 2020-02-11 NOTE — Progress Notes (Signed)
Occupational Therapy Treatment Patient Details Name: Kathleen Schneider MRN: [id number] DOB: 06-19-1940 Today's Date: 02/11/2020    History of present illness 79 yo female admitted with Pna, Sepsis after falling at home. Hx of CKD, gait abnormality, visual deficits, spinal stenosis   OT comments  Patient progressing slowly but steadily and actively participates with therapy, tolerating sitting EOB for 45 minutes while also working on transfers with RW and Denna Haggard. Patient limited by generalized weakness and quick to fatigue with all mobility and with very limited standing tolerance, along with deficits noted below. Pt continues to demonstrate good rehab potential and would benefit from continued skilled OT to increase safety and independence with ADLs and functional transfers to allow pt to return home safely and reduce caregiver burden and fall risk.   Follow Up Recommendations  SNF    Equipment Recommendations  3 in 1 bedside commode    Recommendations for Other Services      Precautions / Restrictions Precautions Precautions: Fall Precaution Comments: monitor O2 Restrictions Weight Bearing Restrictions: No       Mobility Bed Mobility Overal bed mobility: Needs Assistance Bed Mobility: Supine to Sit;Sit to Supine     Supine to sit: Mod assist;HOB elevated Sit to supine: Mod assist   General bed mobility comments: Reliance on bed rails. Very fatigued with each transition with need of extended rest breaks. SpO2 monitored and staying at or above 92%. BP taken in supine and sitting as pt with recent episode of nausea and gray pallor per CNA. Supine BP 104/59, HR 72. EOB: 112/70.  Transfers Overall transfer level: Needs assistance Equipment used: Rolling walker (2 wheeled) Transfers: Sit to/from Stand Sit to Stand: Min assist;From elevated surface         General transfer comment: Min Assist to stand to RW from EOB. Pt quickly fatigued and stood <~3 seconds. 2nd stand to  [facility] with cues for increased standing time, but pt unable to maintain and sat on Stedy seat. During Stedy sitting pt again worked on postural, neck and breathing exercises to tolerance.    Balance Overall balance assessment: Needs assistance Sitting-balance support: Bilateral upper extremity supported;Feet supported Sitting balance-[name]: Poor Sitting balance - Comments: Fluctuating from poor to fair sitting balance with need of BUE support on bed.   Standing balance support: Bilateral upper extremity supported Standing balance-[name]: Poor                             ADL either performed or assessed with clinical judgement   ADL Overall ADL's : Needs assistance/impaired       Grooming Details (indicate cue type and reason): Patient washed face sitting at side of bed today with set up. patient's posture kyphotic and patient neck flexed and face to the floor. Pt educated on head retraction exercises and pt able to demo for short periords with cues for increased thoracic extension and pt able to demonstrate back for short periords but quick to fatigue.     Lower Body Bathing: Set up;Sitting/lateral leans;Maximal assistance Lower Body Bathing Details (indicate cue type and reason): Pt with urinary incontinence during mobility, and required Max As to cleanse LEs and peri area with EOB sitting.           Toilet Transfer Details (indicate cue type and reason): Please refer to Mobility section.   Toileting - Clothing Manipulation Details (indicate cue type and reason): Please refer to LB bathing  above.     Functional mobility during ADLs: Moderate assistance;Rolling walker General ADL Comments: Switched to Denna Haggard after first initial stand.     Vision Patient Visual Report: No change from baseline Vision Assessment?: No apparent visual deficits   Perception     Praxis      Cognition Arousal/Alertness: Awake/alert Behavior During Therapy: WFL for  tasks assessed/performed Overall Cognitive Status: Impaired/Different from baseline Area of Impairment: Safety/judgement;Awareness;Memory                     Memory: Decreased short-term memory   Safety/Judgement: Decreased awareness of deficits Awareness: Emergent   General Comments: Patient alert to self and place with vague situation. Knows the month and year but not the date. One episode of crying this treatment after MD in to speak with her about bone biopsy.        Exercises     Shoulder Instructions       General Comments RW or Stedy to stand.    Pertinent Vitals/ Pain       Pain Assessment: No/denies pain  Home Living                                          Prior Functioning/Environment              Frequency  Min 2X/week        Progress Toward Goals  OT Goals(current goals can now be found in the care plan section)  Progress towards OT goals: Progressing toward goals  Acute Rehab OT Goals Patient Stated Goal: to regain independence and PLOF OT Goal Formulation: With patient Time For Goal Achievement: 02/20/20 Potential to Achieve Goals: Good  Plan Discharge plan remains appropriate    Co-evaluation                 AM-PAC OT "6 Clicks" Daily Activity     Outcome Measure   Help from another person eating meals?: None Help from another person taking care of personal grooming?: A Little Help from another person toileting, which includes using toliet, bedpan, or urinal?: A Lot Help from another person bathing (including washing, rinsing, drying)?: A Lot Help from another person to put on and taking off regular upper body clothing?: A Little Help from another person to put on and taking off regular lower body clothing?: A Lot 6 Click Score: 16    End of Session Equipment Utilized During Treatment: Rolling walker;Oxygen;Gait belt Charlaine Dalton)  OT Visit Diagnosis: Unsteadiness on feet (R26.81);Muscle weakness  (generalized) (M62.81);History of falling (Z91.81)   Activity Tolerance Patient tolerated treatment well;Patient limited by fatigue   Patient Left in bed;with call bell/phone within reach;with bed alarm set   Nurse Communication Mobility status (Nausea)        Time: 9518-8416 OT Time Calculation (min): 67 min  Charges: OT General Charges $OT Visit: 1 Visit OT Treatments $Self Care/Home Management : 8-22 mins $Therapeutic Activity: 38-52 mins  Anderson [place], [place] Office: [phone] 02/11/2020   Julien Girt 02/11/2020, 1:01 PM

## 2020-02-11 NOTE — Telephone Encounter (Signed)
Yes call them back, that is fine

## 2020-02-11 NOTE — Progress Notes (Addendum)
HEMATOLOGY-ONCOLOGY PROGRESS NOTE  Patient Care Team: Donald Prose, MD as PCP - General (Family Medicine)   ASSESSMENT & PLAN:    Diffuse sclerotic bone lesion, worrisome for metastatic cancer, unknown origin I could not find any localizing signs to suggest her primary malignancy CT chest/abdomen/pelvis reviewed which again shows innumerable sclerotic lesions consistent with metastatic disease, but no definitive primary site Noted to have prominent lymph nodes in the left axillary and subpectoral regions, but not enlarged by CT criteria and bronchial wall thickening more likely related to infection/inflammation There are some small hypoattenuating lesions in the liver which are too small to characterize Metastatic breast cancer or lung cancer are likely causes of the bone lesions seen, but no obvious lung mass on CT No breast masses felt on exam and there is no mention of a breast mass on CT I discussed with the patient and primary service I will order a CT-guided bone marrow aspirate and biopsy to see if we can detect malignant cells in the bone marrow If not, we will have to order MRI of her specific bone and to get CT-guided biopsy of the bone lesion, possibly near her left hip but this location would be harder to attempt with higher risk and hence I prefer to do CT-guided biopsy first  Anemia Multifactorial, likely due to recent infection and anemia chronic kidney disease.  Bone marrow infiltration from malignancy can also cause her anemia She has no evidence of iron deficiency anemia or B12 deficiency Observe only for now She does not need transfusion support   Respiratory failure secondary to pneumonia Continue antibiotics per hospitalist team  Left-sided hydronephrosis Etiology unclear Renal function stable Recommend urology consult  Discharge planning I do not recommend discharge until her work-up is complete Plan of care is discussed with primary service I will continue to  follow   All questions were answered. The patient knows to call the clinic with any problems, questions or concerns. No barriers to learning was detected.  Mikey Bussing, DNP, AGPCNP-BC, AOCNP Heath Lark, MD  INTERVAL HISTORY: Denies chest pain and cough Still has shortness of breath with minimal exertion Reports diarrhea this morning but denies abdominal pain, nausea, vomiting Denies bone pain CT chest/abdomen/pelvis with contrast performed last evening  REVIEW OF SYSTEMS:   Constitutional: Denies fevers, chills Eyes: Denies blurriness of vision Ears, nose, mouth, throat, and face: Denies mucositis or sore throat Respiratory: Denies cough but reports shortness of breath with minimal exertion Cardiovascular: Denies palpitation, chest discomfort Gastrointestinal: Denies nausea and vomiting but has diarrhea this morning Skin: Denies abnormal skin rashes Lymphatics: Denies new lymphadenopathy or easy bruising Neurological:Denies numbness, tingling or new weaknesses Behavioral/Psych: Mood is stable, no new changes  Extremities: No lower extremity edema All other systems were reviewed with the patient and are negative.  I have reviewed the past medical history, past surgical history, social history and family history with the patient and they are unchanged from previous note.   PHYSICAL EXAMINATION: ECOG PERFORMANCE STATUS: 4 - Bedbound  Vitals:   02/11/20 0431 02/11/20 0912  BP: (!) 126/59 128/69  Pulse: 78   Resp: 20   Temp: 98.2 F (36.8 C) 98.5 F (36.9 C)  SpO2: 93%    Filed Weights   02/04/20 1300  Weight: 84.2 kg    Intake/Output from previous day: 10/27 0701 - 10/28 0700 In: 180 [P.O.:180] Out: 800 [Urine:800]  GENERAL: Elderly female who appears frail, no distress SKIN: skin color, texture, turgor are normal, no rashes or  significant lesions EYES: normal, Conjunctiva are pink and non-injected, sclera clear OROPHARYNX:no exudate, no erythema and lips,  buccal mucosa, and tongue normal  NECK: supple, thyroid normal size, non-tender, without nodularity LYMPH:  no palpable lymphadenopathy in the cervical, axillary or inguinal LUNGS: clear to auscultation and percussion with normal breathing effort HEART: regular rate & rhythm and no murmurs and no lower extremity edema ABDOMEN:abdomen soft, non-tender and normal bowel sounds NEURO: alert & oriented x 3 with fluent speech, no focal motor/sensory deficits  LABORATORY DATA:  I have reviewed the data as listed CMP Latest Ref Rng & Units 02/10/2020 02/08/2020 02/07/2020  Glucose 70 - 99 mg/dL - 89 103(H)  BUN 8 - 23 mg/dL - 23 26(H)  Creatinine 0.44 - 1.00 mg/dL 1.45(H) 1.62(H) 1.60(H)  Sodium 135 - 145 mmol/L - 135 136  Potassium 3.5 - 5.1 mmol/L - 4.4 3.8  Chloride 98 - 111 mmol/L - 97(L) 105  CO2 22 - 32 mmol/L - 28 24  Calcium 8.9 - 10.3 mg/dL - 8.6(L) 8.1(L)  Total Protein 6.5 - 8.1 g/dL - 6.3(L) 5.5(L)  Total Bilirubin 0.3 - 1.2 mg/dL - 0.5 0.3  Alkaline Phos 38 - 126 U/L - 111 105  AST 15 - 41 U/L - 14(L) 18  ALT 0 - 44 U/L - 17 19    Lab Results  Component Value Date   WBC 10.6 (H) 02/08/2020   HGB 9.7 (L) 02/08/2020   HCT 30.8 (L) 02/08/2020   MCV 90.1 02/08/2020   PLT 204 02/08/2020   NEUTROABS 10.4 (H) 02/04/2020   I have personally reviewed her CT imaging NM Bone Scan Whole Body  Result Date: 02/09/2020 CLINICAL DATA:  Metastatic disease evaluation, sclerotic foci on pelvic radiographs several falls at home, BILATERAL leg pain, history hypertension EXAM: NUCLEAR MEDICINE WHOLE BODY BONE SCAN TECHNIQUE: Whole body anterior and posterior images were obtained approximately 3 hours after intravenous injection of radiopharmaceutical. RADIOPHARMACEUTICALS:  21.2 mCi Technetium-93m MDP IV COMPARISON:  None Radiographic correlation: LEFT hip and pelvic radiographs 02/07/2020 FINDINGS: Multiple foci of abnormal osseous tracer accumulation consistent with osseous metastases. These  include calvarium, sternum, posterior ribs, thoracic and lumbar spine, pelvis, and proximal RIGHT femur. Question subtle metastatic lesion at mid RIGHT femoral diaphysis. Fairly symmetric uptake is seen at the humeral heads, proximal femora, knees, and ankles, nonspecific but symmetry could reflect marrow reconversion. Expected urinary tract and soft tissue distribution of tracer. IMPRESSION: Scattered osseous metastases. Metastatic lesion involving proximal RIGHT femur and questionably mid RIGHT femoral diaphysis. Electronically Signed   By: Lavonia Dana M.D.   On: 02/09/2020 14:38   CT CHEST ABDOMEN PELVIS W CONTRAST  Result Date: 02/11/2020 CLINICAL DATA:  Metastatic bone disease.  Cancer of unknown primary. EXAM: CT CHEST, ABDOMEN, AND PELVIS WITH CONTRAST TECHNIQUE: Multidetector CT imaging of the chest, abdomen and pelvis was performed following the standard protocol during bolus administration of intravenous contrast. CONTRAST:  88mL OMNIPAQUE IOHEXOL 300 MG/ML  SOLN COMPARISON:  None. FINDINGS: CT CHEST FINDINGS Cardiovascular: The heart size is normal. No substantial pericardial effusion. Coronary artery calcification is evident. Atherosclerotic calcification is noted in the wall of the thoracic aorta. Mediastinum/Nodes: No mediastinal lymphadenopathy. There is no hilar lymphadenopathy. The esophagus has normal imaging features. No right axillary lymphadenopathy. Lymph nodes in the left axilla are asymmetrically prominent but not enlarged by CT criteria. 8 mm short axis left subpectoral node visible on 18/2. 10 mm short axis left axillary node visible on 24/2. Lungs/Pleura: Diffuse bronchial wall thickening  noted in both lungs with scattered areas of small airway impaction (well demonstrated left apex on 32/4, posterior right lower lobe on 78/4 and right middle lobe on 93/4). Associated tree-in-bud nodularity seen in the right middle lobe, anterior right upper lobe and posterior right upper lobe with 1  of the more dominant nodule seen in the peripheral right upper lobe on 53/4 measuring 5 mm. Tiny bilateral pleural effusions associated. Musculoskeletal: Innumerable sclerotic bone lesions are seen in the bony anatomy of the chest. CT ABDOMEN PELVIS FINDINGS Hepatobiliary: Small hypoattenuating lesions in the liver are too small to characterize and measure in the 5-10 mm size range. No overtly suspicious enhancing mass lesion in the hepatic parenchyma. Gallbladder surgically absent. No intrahepatic or extrahepatic biliary dilation. Pancreas: No focal mass lesion. No dilatation of the main duct. No intraparenchymal cyst. No peripancreatic edema. Spleen: No splenomegaly. No focal mass lesion. Adrenals/Urinary Tract: No adrenal nodule or mass. Right kidney unremarkable. There is marked left-sided hydronephrosis with areas of cortical thinning overlying the dilated calices. 6 mm stone noted lower pole with 2 mm interpolar left renal stone. There is substantial perinephric edema/stranding. Potential tiny calcification in the region of the UPJ (coronal 78/5) with no dilatation of the proximal left ureter left ureter is nondilated but appears ill-defined. The urinary bladder appears normal for the degree of distention. Stomach/Bowel: Stomach is unremarkable. No gastric wall thickening. No evidence of outlet obstruction. Duodenum is normally positioned as is the ligament of Treitz. Duodenal diverticulum noted. No small bowel wall thickening. No small bowel dilatation. The terminal ileum is normal. The appendix is normal. No gross colonic mass. No colonic wall thickening. Diverticular changes are noted in the left colon without evidence of diverticulitis. Vascular/Lymphatic: There is abdominal aortic atherosclerosis without aneurysm. No gastrohepatic or hepatoduodenal ligament lymphadenopathy. Clustered small lymph nodes are seen in the left para-aortic space at the level of the left renal vein. Left renal vein appears  attenuated. No pelvic sidewall lymphadenopathy. Reproductive: Uterus unremarkable. No adnexal mass. Prominent volume fluid noted in the vagina. Other: There is some trace free fluid in the pelvis along the dome of the bladder. Musculoskeletal: Innumerable sclerotic bone lesions are seen in the lumbar spine and bony pelvis. IMPRESSION: 1. Innumerable sclerotic bone lesions in the bony anatomy of the chest, abdomen, and pelvis, consistent with metastatic disease. 2. Left axillary and subpectoral lymph nodes are asymmetrically prominent but not enlarged by CT criteria. 3. Marked left-sided hydronephrosis with areas of cortical thinning overlying the dilated calices. Potential tiny calcification in the region of the left UPJ with no dilatation of the proximal left ureter. Etiology of the left hydronephrosis not evident by CT although urothelial lesion cannot be excluded. 4. Left nephrolithiasis. 5. Diffuse bronchial wall thickening bilaterally with scattered areas of small airway impaction and tree-in-bud nodularity. Imaging features likely related to infectious/inflammatory etiology. Atypical infection (including MAI) would be a consideration. 6. Small hypoattenuating lesions in the liver are too small to characterize. Attention on follow-up recommended. These are likely cysts, but MRI of the abdomen with and without contrast could be used to confirm as clinically warranted. 7. Prominent volume fluid in the vagina of indeterminate etiology. No fluid or thickening of the endometrial canal in the uterus. 8. Aortic Atherosclerosis (ICD10-I70.0). Electronically Signed   By: Misty Stanley M.D.   On: 02/11/2020 06:19   DG CHEST PORT 1 VIEW  Result Date: 02/07/2020 CLINICAL DATA:  New onset shortness of breath EXAM: PORTABLE CHEST 1 VIEW COMPARISON:  Three  days ago FINDINGS: Large lung volumes with diaphragm flattening. Trace pleural effusions and generalized reticulation that is stable. Normal heart size for technique.  No pneumothorax. IMPRESSION: 1. Trace pleural effusions with mild scarring or atelectasis at the left base. 2. Stable interstitial prominence, favored bronchitic Electronically Signed   By: Monte Fantasia M.D.   On: 02/07/2020 08:46   DG CHEST PORT 1 VIEW  Result Date: 02/04/2020 CLINICAL DATA:  Shortness of breath. EXAM: PORTABLE CHEST 1 VIEW COMPARISON:  February 03, 2020 FINDINGS: Mild, stable, diffuse chronic appearing increased interstitial lung markings are seen. Mild, stable atelectasis is noted within the lateral aspect of the left lung base. There is no evidence of a pleural effusion or pneumothorax. The heart size and mediastinal contours are within normal limits. Radiopaque and lucent artifact is seen overlying the lateral aspect of the mid left chest wall. The visualized skeletal structures are otherwise unremarkable. IMPRESSION: Mild, stable left basilar atelectasis. Electronically Signed   By: Virgina Norfolk M.D.   On: 02/04/2020 20:00   DG Chest Port 1 View  Result Date: 02/03/2020 CLINICAL DATA:  Shortness of breath and cough for 1 month EXAM: PORTABLE CHEST 1 VIEW COMPARISON:  None. FINDINGS: Cardiac shadow is at the upper limits of normal in size. Aortic calcifications are seen. The lungs are well aerated bilaterally with mild left basilar atelectasis/early infiltrate. No bony abnormality is noted. IMPRESSION: Left basilar opacity consistent with atelectasis/infiltrate. Electronically Signed   By: Inez Catalina M.D.   On: 02/03/2020 20:06   ECHOCARDIOGRAM COMPLETE  Result Date: 02/05/2020    ECHOCARDIOGRAM REPORT   Patient Name:   Kathleen Schneider Date of Exam: 02/04/2020 Medical Rec #:  [id number]       Height:       60.0 in Accession #:    [id number]      Weight:       185.6 lb Date of Birth:  12-16-1940       BSA:          1.808 m Patient Age:    51 years        BP:           124/83 mmHg Patient Gender: F               HR:           76 bpm. Exam Location:  Inpatient Procedure: 2D  Echo Indications:    786.09 dyspnea  History:        Patient has no prior history of Echocardiogram examinations.                 Risk Factors:Former Smoker and Hypertension.  Sonographer:    Jannett Celestine RDCS (AE) Referring Phys: [id number] [facility] Julious Oka  Sonographer Comments: Image acquisition challenging due to patient body habitus. IMPRESSIONS  1. Left ventricular ejection fraction, by estimation, is 65 to 70%. The left ventricle has normal function. The left ventricle has no regional wall motion abnormalities. Left ventricular diastolic parameters are consistent with Grade I diastolic dysfunction (impaired relaxation). Elevated left atrial pressure.  2. Right ventricular systolic function is normal. The right ventricular size is normal. There is normal pulmonary artery systolic pressure.  3. The mitral valve is normal in structure. Mild mitral valve regurgitation. No evidence of mitral stenosis.  4. The aortic valve is normal in structure. Aortic valve regurgitation is not visualized. Mild to moderate aortic valve sclerosis/calcification is present, without any evidence of aortic stenosis.  5. The  inferior vena cava is normal in size with greater than 50% respiratory variability, suggesting right atrial pressure of 3 mmHg. Comparison(s): No prior Echocardiogram. FINDINGS  Left Ventricle: Left ventricular ejection fraction, by estimation, is 65 to 70%. The left ventricle has normal function. The left ventricle has no regional wall motion abnormalities. The left ventricular internal cavity size was normal in size. There is  no left ventricular hypertrophy. Left ventricular diastolic parameters are consistent with Grade I diastolic dysfunction (impaired relaxation). Elevated left atrial pressure. Right Ventricle: The right ventricular size is normal. No increase in right ventricular wall thickness. Right ventricular systolic function is normal. There is normal pulmonary artery systolic pressure. Left Atrium: Left  atrial size was normal in size. Right Atrium: Right atrial size was normal in size. Pericardium: There is no evidence of pericardial effusion. Mitral Valve: The mitral valve is normal in structure. Mild mitral valve regurgitation. No evidence of mitral valve stenosis. Tricuspid Valve: The tricuspid valve is normal in structure. Tricuspid valve regurgitation is trivial. No evidence of tricuspid stenosis. Aortic Valve: The aortic valve is normal in structure. Aortic valve regurgitation is not visualized. Mild to moderate aortic valve sclerosis/calcification is present, without any evidence of aortic stenosis. Pulmonic Valve: The pulmonic valve was normal in structure. Pulmonic valve regurgitation is not visualized. No evidence of pulmonic stenosis. Aorta: The aortic root is normal in size and structure. Venous: The inferior vena cava is normal in size with greater than 50% respiratory variability, suggesting right atrial pressure of 3 mmHg. IAS/Shunts: No atrial level shunt detected by color flow Doppler.  LEFT VENTRICLE PLAX 2D LVIDd:         5.07 cm  Diastology LVIDs:         2.88 cm  LV e' medial:    8.38 cm/s LV PW:         0.85 cm  LV E/e' medial:  11.8 LV IVS:        0.90 cm  LV e' lateral:   8.05 cm/s LVOT diam:     2.20 cm  LV E/e' lateral: 12.2 LV SV:         97 LV SV Index:   54 LVOT Area:     3.80 cm  RIGHT VENTRICLE RV S prime:     11.10 cm/s TAPSE (M-mode): 1.8 cm LEFT ATRIUM           Index LA diam:      4.00 cm 2.21 cm/m LA Vol (A2C): 55.8 ml 30.86 ml/m  AORTIC VALVE LVOT Vmax:   99.80 cm/s LVOT Vmean:  65.100 cm/s LVOT VTI:    0.256 m  AORTA Ao Root diam: 2.70 cm MITRAL VALVE MV Area (PHT): 3.27 cm    SHUNTS MV Decel Time: 232 msec    Systemic VTI:  0.26 m MV E velocity: 98.50 cm/s  Systemic Diam: 2.20 cm MV A velocity: 88.30 cm/s MV E/A ratio:  1.12 Ena Dawley MD Electronically signed by Ena Dawley MD Signature Date/Time: 02/05/2020/9:18:19 AM    Final    DG HIPS BILAT WITH PELVIS 3-4  VIEWS  Result Date: 02/07/2020 CLINICAL DATA:  Increased weakness and shortness of breath. EXAM: DG HIP (WITH OR WITHOUT PELVIS) 3-4V BILAT COMPARISON:  None. FINDINGS: There is no evidence of an acute hip fracture or dislocation. Mild degenerative changes seen involving both hips, in the form of joint space narrowing and acetabular sclerosis. Numerous ill-defined sclerotic foci are seen scattered throughout the pelvis and bilateral lower extremities. IMPRESSION:  1. Numerous sclerotic foci, as described above, which may represent sequelae associated with osseous metastasis. Correlation with a nuclear medicine bone scan is recommended. 2. Degenerative changes involving both hips. Electronically Signed   By: Virgina Norfolk M.D.   On: 02/07/2020 16:12    LOS: 8 days

## 2020-02-11 NOTE — Consult Note (Signed)
NAME:  Kathleen Schneider, MRN:  [id number], DOB:  1941-02-01, LOS: 8 ADMISSION DATE:  02/03/2020, CONSULTATION DATE:  02/11/2020  REFERRING MD:  Kathleen Pour, MD, CHIEF COMPLAINT:  Abnormal CT Chest  History of present illness   The patient is an 79 year old woman with a past medical history of hypertension and chronic kidney disease who came in about a week ago with shortness of breath and feeling weak.  She was noted to be mildly hypoxemic and Covid negative.  She was treated for CHF exacerbation as well as community-acquired pneumonia.  During the course of her work-up she was found to have numerous osseous metastases of unknown primary.  CT chest abdomen pelvis for work-up of primary malignancy was obtained yesterday.  There was no clear primary cancer diagnosis.  However CT chest did show some tree-in-bud opacities.  Pulmonary is consulted for evaluation of these in the setting of cancer unknown primary.   She describes herself as a former smoker quit over 30 years ago.  At that time she was a very heavy smoker, she says that she never let herself out of cigarette and probably smokes multiple packs a day.  She quit because she was getting pneumonia.  Since then no dyspnea cough chest tightness wheezing or hospitalizations for pneumonia.  When she was treated for pneumonia earlier this admission, she says she can even feel like she had it and didn't even have any cough or sputum production.  She is nauseated today.  Past Medical History  She,  has a past medical history of HTN (hypertension) and Vision abnormalities.   Consults:  Oncology, pulmonary, urology.  Procedures:    Significant Diagnostic Tests:  10/27 CT chest abdomen pelvis with innumerable sclerotic bony lesions throughout the body, tree-in-bud opacities in the CT chest and bilateral lobes regular than left.  Micro Data:  Covid test negative, staph epidermidis noted in blood culture, felt to be contaminant  Antimicrobials:    She finished a course of ceftriaxone azithromycin for community-acquired pneumonia  Objective   Blood pressure 128/69, pulse 78, temperature 98.5 F (36.9 C), temperature source Oral, resp. rate 20, height 5' (1.524 m), weight 84.2 kg, SpO2 93 %.        Intake/Output Summary (Last 24 hours) at 02/11/2020 0951 Last data filed at 02/11/2020 0847 Gross per 24 hour  Intake 120 ml  Output 1150 ml  Net -1030 ml   Filed Weights   02/04/20 1300  Weight: 84.2 kg    Examination: General: elderly white woman, no distress, on 2LNC HENT: Dry mucous membranes Lungs: Diminished, clear to auscultation bilaterally Cardiovascular: RRR, no mrg Abdomen: soft, nontender Extremities: no edema Neuro: normal speech, moves all 4 extremities independently MSK: kyphosis, no acute synovitis Lines/Tubes: PIV   Assessment & Plan:  Patient is a very pleasant 79 year old woman with innumerable metastatic bony lesions with an unknown primary.  Pulmonary's been consulted to evaluate the patient in the setting of tree-in-bud opacities on CT chest.  1 Abnormal CT Chest with Tree in Bud opacities 2. Numerous Bony Metastatic lesions, Cancer unknown primary 3. History of heavy tobacco use quit 30 years ago  These tree-in-bud opacities suggest an infectious etiology, and could certainly be consistent with her recent community acquired pneumonia picture.  Kathleen Schneider is not currently bringing up enough sputum to send for culture of the sputum and AFB.  At this point I recommend repeating CT chest in 3 months and correlating clinically her symptoms.  Can consider further  work-up such as bronchoscopy at that time if the opacities are persistent.  Would prioritize her malignancy work-up and treatment for that.  After review of the CT scans, I suspect that bone biopsy is the most obvious next step.  This does not appear to be a primary lung malignancy.  We can set her up to see [name] in clinic in 3 months time.  I suspect  she will be able to be weaned to room air prior to discharge. Goal saturations over 90%.  Thank you for involving [name] in the care of this very nice woman.   Lenice Llamas, MD [facility] [place] Pager: [place]   CBC: Recent Labs  Lab 02/04/20 2035 02/05/20 0417 02/06/20 0408 02/07/20 0321 02/08/20 0418  WBC 11.4* 27.6* 15.4* 11.5* 10.6*  NEUTROABS 10.4*  --   --   --   --   HGB 10.5* 8.4* 8.5* 8.9* 9.7*  HCT 33.2* 26.2* 27.2* 28.3* 30.8*  MCV 91.0 90.0 91.3 92.2 90.1  PLT 189 167 152 171 284    Basic Metabolic Panel: Recent Labs  Lab 02/04/20 2035 02/04/20 2035 02/05/20 0417 02/06/20 0408 02/07/20 0321 02/08/20 0418 02/10/20 0506  NA 133*  --  132* 137 136 135  --   K 3.5  --  3.5 3.0* 3.8 4.4  --   CL 100  --  101 105 105 97*  --   CO2 19*  --  22 22 24 28   --   GLUCOSE 106*  --  190* 146* 103* 89  --   BUN 40*  --  41* 37* 26* 23  --   CREATININE 1.70*   < > 1.89* 1.54* 1.60* 1.62* 1.45*  CALCIUM 8.5*  --  8.0* 8.4* 8.1* 8.6*  --    < > = values in this interval not displayed.   GFR: Estimated Creatinine Clearance: 29.8 mL/min (A) (by C-G formula based on SCr of 1.45 mg/dL (H)). Recent Labs  Lab 02/04/20 1533 02/04/20 1749 02/04/20 2035 02/04/20 2035 02/04/20 2327 02/05/20 0417 02/06/20 0408 02/07/20 0321 02/08/20 0418  WBC  --   --  11.4*   < >  --  27.6* 15.4* 11.5* 10.6*  LATICACIDVEN 1.4 1.3 2.2*  --  1.1  --   --   --   --    < > = values in this interval not displayed.    Liver Function Tests: Recent Labs  Lab 02/04/20 2035 02/05/20 0417 02/06/20 0408 02/07/20 0321 02/08/20 0418  AST 20 17 21 18  14*  ALT 18 16 20 19 17   ALKPHOS 261* 134* 120 105 111  BILITOT 0.9 0.6 0.5 0.3 0.5  PROT 6.8 5.7* 5.6* 5.5* 6.3*  ALBUMIN 2.8* 2.2* 2.2* 2.1* 2.5*   No results for input(s): LIPASE, AMYLASE in the last 168 hours. No results for input(s): AMMONIA in the last 168  hours.  ABG    Component Value Date/Time   PHART 7.456 (H) 02/04/2020 2025   PCO2ART 30.3 (L) 02/04/2020 2025   PO2ART 75.3 (L) 02/04/2020 2025   HCO3 21.0 02/04/2020 2025   ACIDBASEDEF 1.7 02/04/2020 2025   O2SAT 95.1 02/04/2020 2025     Coagulation Profile: No results for input(s): INR, PROTIME in the last 168 hours.  Cardiac Enzymes: No results for input(s): CKTOTAL, CKMB, CKMBINDEX, TROPONINI in the last 168 hours.  HbA1C: No results found for: HGBA1C  CBG: No results for input(s): GLUCAP in the last  168 hours.  Review of Systems:   +cough - hemoptysis - fevers +weight loss +fatigue +nausea +decreased appetite Otherwise 10 point ROS reviewed and negative  Surgical History    Past Surgical History:  Procedure Laterality Date  . CHOLECYSTECTOMY, LAPAROSCOPIC       Social History   reports that she has quit smoking. She has never used smokeless tobacco. She reports current alcohol use. She reports that she does not use drugs.   Family History   Her family history includes Alcoholism in her father; Seizures in her mother.   Allergies No Known Allergies   Home Medications  Prior to Admission medications   Medication Sig Start Date End Date Taking? Authorizing Provider  escitalopram (LEXAPRO) 5 MG tablet Take 5 mg by mouth daily.  11/27/19  Yes [provider]  lamoTRIgine (LAMICTAL) 150 MG tablet TAKE 1 TABLET BY MOUTH TWICE A DAY Patient taking differently: Take 150 mg by mouth 2 (two) times daily.  12/07/19  Yes Sater, Nanine Means, MD  lidocaine (LIDODERM) 5 % Place 1 patch onto the skin daily. Remove & Discard patch within 12 hours or as directed by MD 12/12/19  Yes Shelly Coss, MD  meloxicam (MOBIC) 7.5 MG tablet Take 7.5 mg by mouth daily.   Yes [provider]  oxybutynin (DITROPAN) 5 MG tablet TAKE 1 TABLET BY MOUTH TWICE A DAY Patient taking differently: Take 5 mg by mouth 2 (two) times daily.  11/23/19  Yes Sater, Nanine Means, MD   collagenase (SANTYL) ointment Apply topically daily. Patient not taking: Reported on 02/03/2020 12/12/19   Shelly Coss, MD

## 2020-02-11 NOTE — Progress Notes (Addendum)
PROGRESS NOTE    Kathleen Schneider  LPF:[id number] DOB: 08-23-1940 DOA: 02/03/2020 PCP: Donald Prose, MD   Brief Narrative:79 y.o. female with medical history significant of hypertension, recurrent UTI, chronic kidney disease stage III, excessive daytime sleepiness, gait abnormalities, visual changes, who came in from home with shortness of breath and weakness.  Patient noticed progressive shortness of breath over the last 3 to 4 days.  Associated with some cough.  She felt very weak.  No fever or chills.  She has been weak that she has been mostly in bed.  She came to the ER where she was evaluated.  Patient was noted to be mildly hypoxic.  She also requires about 2 L of oxygen now.  She is COVID-19 negative but has evidence of pneumonia versus CHF.  Her BNP is elevated but also has leukocytosis and infiltrates that could be technically speaking and pneumonia.  She is got elevated creatinine which is slightly above her baseline.  She is therefore being admitted to the hospital for acute respiratory failure probably pneumonia but could be CHF as well..  ED Course: Temperature 98.1 blood pressure 130/59 pulse 108 respiratory rate of 24 oxygen sats 93% on 2 L.  White count is 18.2, hemoglobin 11.3, platelets 238 sodium 132 potassium 3.2 chloride 96 CO2 22 BUN 37 creatinine 2.05 and calcium 8.5.  Glucose 139.  COVID-19 screen is negative.  Urinalysis showed many bacteria with WBC 21-50 but negative leukocytes and nitrite.  Probably asymptomatic bacteriuria.  Chest x-ray showed left basilar opacity consistent with atelectasis versus infiltrate.  BNP is 382.  Patient being admitted with pneumonia as well as suspected cardiac cause.   Assessment & Plan:   Principal Problem:   Acute respiratory failure with hypoxia (HCC) Active Problems:   Spinal stenosis, lumbar   ARF (acute renal failure) (HCC)   Hyponatremia   Leucocytosis   Hypokalemia   Elevated brain natriuretic peptide (BNP) level   Sepsis  (HCC)   Metastatic cancer to bone [facility])   Advanced care planning/counseling discussion   Goals of care, counseling/discussion   Palliative care by specialist  Sepsis present on admission due to CAP: Since resolved. As evidenced by lactic acidosis secondary to community-acquired pneumonia-  she was tachypneic tachycardic and febrile at 103.1 with lactic acidosis and leukocytosis.  Lactic acid level was 2.2 down to 1.1 Chest x-ray with left basilar opacity consistent with atelectasis or infiltrates. She was treated with Rocephin and azithromycin. Follow-up blood cultures-staph epidermidis thought to be a contaminant. Blood pressure improved with IV fluids. Influenza negative Covid negative. Leukocytosis resolved. MRSA PCR negative.  Patient became hypoxic on 02/08/2020 requiring 6 L of oxygen.  She received multiple doses of IV Lasix.  Today she has improved 97% on 2 L.  She is awake alert not in any respiratory distress.  Widespread sclerotic bony lesions: No definite primary localized on pan-CT scanning.  - BM biopsy/aspiration tentatively scheduled per IR for 10/29. NPO p MN.  - Note patient is unlikely to be able to follow up in the outpatient setting to reliably complete work up, so would prefer to conclude this here prior to discharge.   Right > Left hydronephrosis: Suspected to have a more chronic course.  - Urology consulted. With preserved renal function, decompression not currently recommended.   Acute hypoxic respiratory failure secondary to CAP:  - Completed ceftriaxone, azithromycin x5 days with improvement in leukocytosis. - Wean oxygen to room air. If unable to do so, will need repeat CXR.  Tree-in-bud opacities on CT scan:  - Pulmonary consulted. With no sputum for culture/AFB and improving respiratory symptoms, 3 month repeat CT with consideration for bronchoscopy at that time is recommended. No evidence of primary pulmonary malignancy.  Hyponatremia: Resolved.   AKI  on CKD stage IIIb: Improved.  Elevated BNP: Echo 02/05/2020 with ejection fraction 65 to 70% with normal LV function no regional wall motion abnormalities.  Grade 1 diastolic dysfunction. IVC normal caliber and phasicity.  - Monitor volume status clinically.   Demand ischemia: Mildly elevated troponin without anginal complaints.  - Consider outpatient ischemic evaluation.  Deconditioning, frequent falls: Patient had successfully rehabilitated at [facility] previously and requires rehabilitation now though this has been declined by insurance. She is planning on discharging home which is unlikely to be a safe disposition.  Obesity: Estimated body mass index is 36.25 kg/m as calculated from the following:   Height as of this encounter: 5' (1.524 m).   Weight as of this encounter: 84.2 kg.  DVT prophylaxis: Lovenox  Code Status:DNR Family Communication: None at bedside. Disposition Plan:  Status is: Inpatient  Dispo: The patient is from: Home              Anticipated d/c is to: Home with maximal support arranged by CSW. Per report, her home is not inhabitable and she remains at very high risk for readmission or worse if discharged in this condition.              Anticipated d/c date is: > 3 days              Patient currently is not medically stable to d/c. Requires further evaluation of newly diagnosed widespread sclerotic lesions.   Consultants:   Oncology, Dr. Alvy Bimler  Pulmonology  Palliative Care  Urology  Procedures: None Antimicrobials: Rocephin and azithromycin  Subjective: Coping with news of suspected metastatic cancer, amenable to work up. Remains very deconditioned and unable to perform her own ADLs. Making normal urine without dysuria or hematuria. No bony tenderness.   objective: Vitals:   02/10/20 2028 02/11/20 0431 02/11/20 0912 02/11/20 1329  BP: 128/60 (!) 126/59 128/69 (!) 102/55  Pulse: 74 78  74  Resp: 19 20  16   Temp: 98.8 F (37.1 C) 98.2 F (36.8  C) 98.5 F (36.9 C) 97.9 F (36.6 C)  TempSrc: Oral Oral Oral Oral  SpO2: 99% 93%  98%  Weight:      Height:        Intake/Output Summary (Last 24 hours) at 02/11/2020 1533 Last data filed at 02/11/2020 1420 Gross per 24 hour  Intake 240 ml  Output 1350 ml  Net -1110 ml   Filed Weights   02/04/20 1300  Weight: 84.2 kg    Examination: Gen: Older female in no distress Pulm: Nonlabored breathing supplemental oxygen. Clear. CV: Regular rate and rhythm. No murmur, rub, or gallop. No JVD, no dependent edema. GI: Abdomen soft, non-tender, non-distended, with normoactive bowel sounds.  Ext: Warm, no deformities.  Skin: No rashes, lesions or ulcers on visualized skin. Neuro: Alert and oriented. No focal neurological deficits but diffusely weak. Psych: Judgement and insight appear fair. Mood euthymic & affect congruent. Emotional lability resolved. Behavior is appropriate.    Data Reviewed: I have personally reviewed following labs and imaging studies  CBC: Recent Labs  Lab 02/04/20 2035 02/05/20 0417 02/06/20 0408 02/07/20 0321 02/08/20 0418  WBC 11.4* 27.6* 15.4* 11.5* 10.6*  NEUTROABS 10.4*  --   --   --   --  HGB 10.5* 8.4* 8.5* 8.9* 9.7*  HCT 33.2* 26.2* 27.2* 28.3* 30.8*  MCV 91.0 90.0 91.3 92.2 90.1  PLT 189 167 152 171 193   Basic Metabolic Panel: Recent Labs  Lab 02/04/20 2035 02/04/20 2035 02/05/20 0417 02/06/20 0408 02/07/20 0321 02/08/20 0418 02/10/20 0506  NA 133*  --  132* 137 136 135  --   K 3.5  --  3.5 3.0* 3.8 4.4  --   CL 100  --  101 105 105 97*  --   CO2 19*  --  22 22 24 28   --   GLUCOSE 106*  --  190* 146* 103* 89  --   BUN 40*  --  41* 37* 26* 23  --   CREATININE 1.70*   < > 1.89* 1.54* 1.60* 1.62* 1.45*  CALCIUM 8.5*  --  8.0* 8.4* 8.1* 8.6*  --    < > = values in this interval not displayed.   GFR: Estimated Creatinine Clearance: 29.8 mL/min (A) (by C-G formula based on SCr of 1.45 mg/dL (H)). Liver Function Tests: Recent Labs    Lab 02/04/20 2035 02/05/20 0417 02/06/20 0408 02/07/20 0321 02/08/20 0418  AST 20 17 21 18  14*  ALT 18 16 20 19 17   ALKPHOS 261* 134* 120 105 111  BILITOT 0.9 0.6 0.5 0.3 0.5  PROT 6.8 5.7* 5.6* 5.5* 6.3*  ALBUMIN 2.8* 2.2* 2.2* 2.1* 2.5*   Cardiac Enzymes: No results for input(s): CKTOTAL, CKMB, CKMBINDEX, TROPONINI in the last 168 hours. Sepsis Labs: Recent Labs  Lab 02/04/20 1749 02/04/20 2035 02/04/20 2327  LATICACIDVEN 1.3 2.2* 1.1    Recent Results (from the past 240 hour(s))  Resp Panel by RT PCR (RSV, Flu A&B, Covid) - Nasopharyngeal Swab     Status: None   Collection Time: 02/03/20  6:25 PM   Specimen: Nasopharyngeal Swab  Result Value Ref Range Status   SARS Coronavirus 2 by RT PCR NEGATIVE NEGATIVE Final    Comment: (NOTE) SARS-CoV-2 target nucleic acids are NOT DETECTED.  The SARS-CoV-2 RNA is generally detectable in upper respiratoy specimens during the acute phase of infection. The lowest concentration of SARS-CoV-2 viral copies this assay can detect is 131 copies/mL. A negative result does not preclude SARS-Cov-2 infection and should not be used as the sole basis for treatment or other patient management decisions. A negative result may occur with  improper specimen collection/handling, submission of specimen other than nasopharyngeal swab, presence of viral mutation(s) within the areas targeted by this assay, and inadequate number of viral copies (<131 copies/mL). A negative result must be combined with clinical observations, patient history, and epidemiological information. The expected result is Negative.  Fact Sheet for Patients:  PinkCheek.be  Fact Sheet for Healthcare Providers:  GravelBags.it  This test is no t yet approved or cleared by the [place] FDA and  has been authorized for detection and/or diagnosis of SARS-CoV-2 by FDA under an Emergency Use Authorization (EUA). This  EUA will remain  in effect (meaning this test can be used) for the duration of the COVID-19 declaration under Section 564(b)(1) of the Act, 21 U.S.C. section 360bbb-3(b)(1), unless the authorization is terminated or revoked sooner.     Influenza A by PCR NEGATIVE NEGATIVE Final   Influenza B by PCR NEGATIVE NEGATIVE Final    Comment: (NOTE) The Xpert Xpress SARS-CoV-2/FLU/RSV assay is intended as an aid in  the diagnosis of influenza from Nasopharyngeal swab specimens and  should not be used as a  sole basis for treatment. Nasal washings and  aspirates are unacceptable for Xpert Xpress SARS-CoV-2/FLU/RSV  testing.  Fact Sheet for Patients: PinkCheek.be  Fact Sheet for Healthcare Providers: GravelBags.it  This test is not yet approved or cleared by the [place] FDA and  has been authorized for detection and/or diagnosis of SARS-CoV-2 by  FDA under an Emergency Use Authorization (EUA). This EUA will remain  in effect (meaning this test can be used) for the duration of the  Covid-19 declaration under Section 564(b)(1) of the Act, 21  U.S.C. section 360bbb-3(b)(1), unless the authorization is  terminated or revoked.    Respiratory Syncytial Virus by PCR NEGATIVE NEGATIVE Final    Comment: (NOTE) Fact Sheet for Patients: PinkCheek.be  Fact Sheet for Healthcare Providers: GravelBags.it  This test is not yet approved or cleared by the [place] FDA and  has been authorized for detection and/or diagnosis of SARS-CoV-2 by  FDA under an Emergency Use Authorization (EUA). This EUA will remain  in effect (meaning this test can be used) for the duration of the  COVID-19 declaration under Section 564(b)(1) of the Act, 21 U.S.C.  section 360bbb-3(b)(1), unless the authorization is terminated or  revoked. Performed at [facility], [place] [address], [place], [place] [address]   Culture, blood (routine x 2) Call MD if unable to obtain prior to antibiotics being given     Status: Abnormal   Collection Time: 02/04/20  6:45 AM   Specimen: BLOOD  Result Value Ref Range Status   Specimen Description   Final    BLOOD LEFT ANTECUBITAL Performed at [facility] [address], [place], [place] [address]    Special Requests   Final    BOTTLES DRAWN AEROBIC AND ANAEROBIC Blood Culture adequate volume Performed at [facility] [address], [place], [place] [address]    Culture  Setup Time   Final    AEROBIC BOTTLE ONLY GRAM POSITIVE COCCI Organism ID to follow CRITICAL RESULT CALLED TO, READ BACK BY AND VERIFIED WITH: E JACKSON PHARMD 02/06/20 0210 JDW    Culture (A)  Final    STAPHYLOCOCCUS EPIDERMIDIS THE SIGNIFICANCE OF ISOLATING THIS ORGANISM FROM A SINGLE SET OF BLOOD CULTURES WHEN MULTIPLE SETS ARE DRAWN IS UNCERTAIN. PLEASE NOTIFY THE MICROBIOLOGY DEPARTMENT WITHIN ONE WEEK IF SPECIATION AND SENSITIVITIES ARE REQUIRED. Performed at [facility], [place] [address], [place], [place] [address]    Report Status 02/06/2020 FINAL  Final  Blood Culture ID Panel (Reflexed)     Status: Abnormal   Collection Time: 02/04/20  6:45 AM  Result Value Ref Range Status   Enterococcus faecalis NOT DETECTED NOT DETECTED Final   Enterococcus Faecium NOT DETECTED NOT DETECTED Final   Listeria monocytogenes NOT DETECTED NOT DETECTED Final   Staphylococcus species DETECTED (A) NOT DETECTED Final    Comment: CRITICAL RESULT CALLED TO, READ BACK BY AND VERIFIED WITH: E JACKSON PHARMD 02/06/20 0210 JDW    Staphylococcus aureus (BCID) NOT DETECTED NOT DETECTED Final   Staphylococcus epidermidis DETECTED (A) NOT DETECTED Final    Comment: Methicillin (oxacillin) resistant coagulase negative staphylococcus. Possible blood culture contaminant (unless isolated from more than one blood culture draw or clinical case  suggests pathogenicity). No antibiotic treatment is indicated for blood  culture contaminants. CRITICAL RESULT CALLED TO, READ BACK BY AND VERIFIED WITH: E JACKSON PHARMD 02/06/20 0210 JDW    Staphylococcus lugdunensis NOT DETECTED NOT DETECTED Final   Streptococcus species NOT  DETECTED NOT DETECTED Final   Streptococcus agalactiae NOT DETECTED NOT DETECTED Final   Streptococcus pneumoniae NOT DETECTED NOT DETECTED Final   Streptococcus pyogenes NOT DETECTED NOT DETECTED Final   A.calcoaceticus-baumannii NOT DETECTED NOT DETECTED Final   Bacteroides fragilis NOT DETECTED NOT DETECTED Final   Enterobacterales NOT DETECTED NOT DETECTED Final   Enterobacter cloacae complex NOT DETECTED NOT DETECTED Final   Escherichia coli NOT DETECTED NOT DETECTED Final   Klebsiella aerogenes NOT DETECTED NOT DETECTED Final   Klebsiella oxytoca NOT DETECTED NOT DETECTED Final   Klebsiella pneumoniae NOT DETECTED NOT DETECTED Final   Proteus species NOT DETECTED NOT DETECTED Final   Salmonella species NOT DETECTED NOT DETECTED Final   Serratia marcescens NOT DETECTED NOT DETECTED Final   Haemophilus influenzae NOT DETECTED NOT DETECTED Final   Neisseria meningitidis NOT DETECTED NOT DETECTED Final   Pseudomonas aeruginosa NOT DETECTED NOT DETECTED Final   Stenotrophomonas maltophilia NOT DETECTED NOT DETECTED Final   Candida albicans NOT DETECTED NOT DETECTED Final   Candida auris NOT DETECTED NOT DETECTED Final   Candida glabrata NOT DETECTED NOT DETECTED Final   Candida krusei NOT DETECTED NOT DETECTED Final   Candida parapsilosis NOT DETECTED NOT DETECTED Final   Candida tropicalis NOT DETECTED NOT DETECTED Final   Cryptococcus neoformans/gattii NOT DETECTED NOT DETECTED Final   Methicillin resistance mecA/C DETECTED (A) NOT DETECTED Final    Comment: CRITICAL RESULT CALLED TO, READ BACK BY AND VERIFIED WITHSeleta Rhymes [facility] 02/06/20 0210 JDW Performed at [facility], [address]  [place], [place] [address]   Culture, blood (routine x 2) Call MD if unable to obtain prior to antibiotics being given     Status: None   Collection Time: 02/04/20  7:10 AM   Specimen: BLOOD RIGHT HAND  Result Value Ref Range Status   Specimen Description   Final    BLOOD RIGHT HAND Performed at [facility] [address] [place], [place] [address]    Special Requests   Final    BOTTLES DRAWN AEROBIC AND ANAEROBIC Blood Culture adequate volume Performed at [facility] [address] [place], [place] [address]    Culture   Final    NO GROWTH 5 DAYS Performed at [facility], [place] [address] [place], [place] [address]    Report Status 02/09/2020 FINAL  Final  MRSA PCR Screening     Status: None   Collection Time: 02/05/20  1:37 PM   Specimen: Nasal Mucosa; Nasopharyngeal  Result Value Ref Range Status   MRSA by PCR NEGATIVE NEGATIVE Final    Comment:        The GeneXpert MRSA Assay (FDA approved for NASAL specimens only), is one component of a comprehensive MRSA colonization surveillance program. It is not intended to diagnose MRSA infection nor to guide or monitor treatment for MRSA infections. Performed at [facility], [place] [address] [place], [place] [address]   SARS Coronavirus 2 by RT PCR (hospital order, performed in [facility] hospital lab) Nasopharyngeal Nasopharyngeal Swab     Status: None   Collection Time: 02/08/20  4:25 PM   Specimen: Nasopharyngeal Swab  Result Value Ref Range Status   SARS Coronavirus 2 NEGATIVE NEGATIVE Final    Comment: (NOTE) SARS-CoV-2 target nucleic acids are NOT DETECTED.  The SARS-CoV-2 RNA is generally detectable in upper and lower respiratory specimens during the acute phase of infection. The lowest concentration of SARS-CoV-2 viral copies  this assay can detect is 250 copies / mL. A negative result does not preclude SARS-CoV-2 infection and should not be used as the  sole basis for treatment or other patient management decisions.  A negative result may occur with improper specimen collection / handling, submission of specimen other than nasopharyngeal swab, presence of viral mutation(s) within the areas targeted by this assay, and inadequate number of viral copies (<250 copies / mL). A negative result must be combined with clinical observations, patient history, and epidemiological information.  Fact Sheet for Patients:   StrictlyIdeas.no  Fact Sheet for Healthcare Providers: BankingDealers.co.za  This test is not yet approved or  cleared by the [place] FDA and has been authorized for detection and/or diagnosis of SARS-CoV-2 by FDA under an Emergency Use Authorization (EUA).  This EUA will remain in effect (meaning this test can be used) for the duration of the COVID-19 declaration under Section 564(b)(1) of the Act, 21 U.S.C. section 360bbb-3(b)(1), unless the authorization is terminated or revoked sooner.  Performed at [facility], [place] [address] [place], [place] [address]     Radiology Studies: CT CHEST ABDOMEN PELVIS W CONTRAST  Result Date: 02/11/2020 CLINICAL DATA:  Metastatic bone disease.  Cancer of unknown primary. EXAM: CT CHEST, ABDOMEN, AND PELVIS WITH CONTRAST TECHNIQUE: Multidetector CT imaging of the chest, abdomen and pelvis was performed following the standard protocol during bolus administration of intravenous contrast. CONTRAST:  29mL OMNIPAQUE IOHEXOL 300 MG/ML  SOLN COMPARISON:  None. FINDINGS: CT CHEST FINDINGS Cardiovascular: The heart size is normal. No substantial pericardial effusion. Coronary artery calcification is evident. Atherosclerotic calcification is noted in the wall of the thoracic aorta. Mediastinum/Nodes: No mediastinal lymphadenopathy. There is no hilar lymphadenopathy. The esophagus has normal imaging features. No right axillary  lymphadenopathy. Lymph nodes in the left axilla are asymmetrically prominent but not enlarged by CT criteria. 8 mm short axis left subpectoral node visible on 18/2. 10 mm short axis left axillary node visible on 24/2. Lungs/Pleura: Diffuse bronchial wall thickening noted in both lungs with scattered areas of small airway impaction (well demonstrated left apex on 32/4, posterior right lower lobe on 78/4 and right middle lobe on 93/4). Associated tree-in-bud nodularity seen in the right middle lobe, anterior right upper lobe and posterior right upper lobe with 1 of the more dominant nodule seen in the peripheral right upper lobe on 53/4 measuring 5 mm. Tiny bilateral pleural effusions associated. Musculoskeletal: Innumerable sclerotic bone lesions are seen in the bony anatomy of the chest. CT ABDOMEN PELVIS FINDINGS Hepatobiliary: Small hypoattenuating lesions in the liver are too small to characterize and measure in the 5-10 mm size range. No overtly suspicious enhancing mass lesion in the hepatic parenchyma. Gallbladder surgically absent. No intrahepatic or extrahepatic biliary dilation. Pancreas: No focal mass lesion. No dilatation of the main duct. No intraparenchymal cyst. No peripancreatic edema. Spleen: No splenomegaly. No focal mass lesion. Adrenals/Urinary Tract: No adrenal nodule or mass. Right kidney unremarkable. There is marked left-sided hydronephrosis with areas of cortical thinning overlying the dilated calices. 6 mm stone noted lower pole with 2 mm interpolar left renal stone. There is substantial perinephric edema/stranding. Potential tiny calcification in the region of the UPJ (coronal 78/5) with no dilatation of the proximal left ureter left ureter is nondilated but appears ill-defined. The urinary bladder appears normal for the degree of distention. Stomach/Bowel: Stomach is unremarkable. No gastric wall thickening. No evidence of outlet obstruction. Duodenum is normally positioned as is the  ligament of  [place]. Duodenal diverticulum noted. No small bowel wall thickening. No small bowel dilatation. The terminal ileum is normal. The appendix is normal. No gross colonic mass. No colonic wall thickening. Diverticular changes are noted in the left colon without evidence of diverticulitis. Vascular/Lymphatic: There is abdominal aortic atherosclerosis without aneurysm. No gastrohepatic or hepatoduodenal ligament lymphadenopathy. Clustered small lymph nodes are seen in the left para-aortic space at the level of the left renal vein. Left renal vein appears attenuated. No pelvic sidewall lymphadenopathy. Reproductive: Uterus unremarkable. No adnexal mass. Prominent volume fluid noted in the vagina. Other: There is some trace free fluid in the pelvis along the dome of the bladder. Musculoskeletal: Innumerable sclerotic bone lesions are seen in the lumbar spine and bony pelvis. IMPRESSION: 1. Innumerable sclerotic bone lesions in the bony anatomy of the chest, abdomen, and pelvis, consistent with metastatic disease. 2. Left axillary and subpectoral lymph nodes are asymmetrically prominent but not enlarged by CT criteria. 3. Marked left-sided hydronephrosis with areas of cortical thinning overlying the dilated calices. Potential tiny calcification in the region of the left UPJ with no dilatation of the proximal left ureter. Etiology of the left hydronephrosis not evident by CT although urothelial lesion cannot be excluded. 4. Left nephrolithiasis. 5. Diffuse bronchial wall thickening bilaterally with scattered areas of small airway impaction and tree-in-bud nodularity. Imaging features likely related to infectious/inflammatory etiology. Atypical infection (including MAI) would be a consideration. 6. Small hypoattenuating lesions in the liver are too small to characterize. Attention on follow-up recommended. These are likely cysts, but MRI of the abdomen with and without contrast could be used to confirm as  clinically warranted. 7. Prominent volume fluid in the vagina of indeterminate etiology. No fluid or thickening of the endometrial canal in the uterus. 8. Aortic Atherosclerosis (ICD10-I70.0). Electronically Signed   By: Misty Stanley M.D.   On: 02/11/2020 06:19   Scheduled Meds: . enoxaparin (LOVENOX) injection  30 mg Subcutaneous Q24H  . escitalopram  5 mg Oral Daily  . lamoTRIgine  150 mg Oral BID  . lidocaine  2 patch Transdermal Q24H  . oxybutynin  5 mg Oral BID   Continuous Infusions: . sodium chloride       LOS: 8 days   Time spent: 35 minutes discussing plan with patient and multiple consultants.   Patrecia Pour, MD 02/11/2020, 3:33 PM

## 2020-02-11 NOTE — Progress Notes (Signed)
PT Cancellation Note  Patient Details Name: Kathleen Schneider MRN: [id number] DOB: Jan 08, 1941   Cancelled Treatment:    Reason Eval/Treat Not Completed: Fatigue/lethargy limiting ability to participate, up in AM with OT. Check back another day.   [name] 02/11/2020, 3:37 PM [place] Pager [phone] Office [phone]

## 2020-02-11 NOTE — Consult Note (Signed)
Consultation Note Date: 02/11/2020   Patient Name: Kathleen Schneider  DOB: [birth year]  MRN: [id number]  Age / Sex: 79 y.o., female  PCP: Kathleen Prose, MD Referring Physician: Patrecia Pour, MD  Reason for Consultation: Establishing goals of care  HPI/Patient Profile: 79 y.o. female  with past medical history of CKD, spinal stenosis, HTN, admission in August of this year for acute kidney injury in the setting of UTI and traumatic rhabdomyolysis from when she was discharged to nursing facility and successfully rehabbed and was able to discharge home.  Now admitted on 02/03/2020 after a fall and weakness at home.  She was admitted and treated for sepsis pneumonia and possibly CHF exacerbation.  She had an echo that showed 65 to 70% ejection fraction and Grade 1 diastolic dysfunction. Admission and recovery have been complicated by incidental findings on pelvic xray of sclerotic bone lesions. Follow up nuclear bone scan shows diffuse uptake in multiple bones consistent with bony metastasis. Workup thus far does not reveal primary. Oncology has been consulted. Bone marrow biopsy planned for tomorrow (10/29). She was originally planned to discharge to SNF however insurance has declined SNF admission due to they feel she is custodial and not rehabable. Palliative medicine consulted for goals of care.   Clinical Assessment and Goals of Care:  I have reviewed medical records including EPIC notes, labs and imaging, received report examined the patient and met at bedside with the patient  to discuss diagnosis prognosis, GOC, EOL wishes, disposition and options.  I introduced Palliative Medicine as specialized medical care for people living with serious illness. It focuses on providing relief from the symptoms and stress of a serious illness.   We discussed a brief life review of the patient. She relocated to [facility] from [place] to be with  a friend and her friend's child. She is retired from working as a Statistician. No family local- has "biological" family in [place], but has a local friend and family she met at her church who she mainly relies on for assistance. She is [employer].   As far as functional and nutritional status- prior to this admission- she was living at home alone with the assistance of in home aids approx 5 hours per day. She required assistance with bathing. She was able to ambulate in her home with a walker, however, when she fell ill, she was unable to get herself up from a fall. She was attending church services.    We discussed her current illness and what it means in the larger context of her on-going co-morbidities.  Kathleen Schneider tells me that she understands she may have cancer and it is likely advanced due to findings of bone metastasis. Kathleen Schneider shares that she isn't afraid to die- but she is afraid of dying- meaning she is afraid that the process of dying would be painful.  If it is found that there is a treatment for her cancer that could prolong her life she would be interested in treating the cancer and  pursuing other life-prolonging treatments.   Advanced directives, concepts specific to code status, artifical feeding and hydration, and rehospitalization were considered and discussed. Kathleen Schneider has a Living Will, HCPOA and DNR (not on chart- requested her to have it brought). I confirmed her desire for DNR- meaning no CPR in the event of cardiac and/or respiratory arrest.  Her designated HCPOA is Patent examiner.   I was able to reach Kathleen Schneider after visiting with Kathleen Schneider. Kathleen Schneider confirmed that she is patient's HCPOA. Kathleen Schneider shared that patient's home was in unlivable condition in August- during patient's hospitalization and SNF stay- Kathleen Schneider was able to clear out the bottom story and convert it into a bedroom and living space. Kathleen Schneider had to hire professional cleaners and they informed her that the living area was  toxic due to cat and rat excrement, and other organisms present. The original plan for patient's care was to sell her home and for her to move into assisted living facility- however, Kathleen Schneider's home is not in sellable condition at this time. Kathleen Schneider feels that Kathleen Schneider is able to make her own decisions, but believes that patient should not be living on her own at this time.   Primary Decision Maker PATIENT    SUMMARY OF RECOMMENDATIONS -DNR -Continue to treat what is treatable with life prolonging intent- however, patient would not want to be kept alive artificially -PMT will follow for biopsy results and recommendations regarding possible treatment and continue to discuss [name]     Code Status/Advance Care Planning:  DNR  Additional Recommendations (Limitations, Scope, Preferences):  Full Scope Treatment  Prognosis:    Unable to determine  Discharge Planning: To Be Determined  Primary Diagnoses: Present on Admission: . ARF (acute renal failure) ([place]) . Spinal stenosis, lumbar . Hyponatremia . Leucocytosis . Hypokalemia . Acute respiratory failure with hypoxia ([place]) . Elevated brain natriuretic peptide (BNP) level . Sepsis ([place])   I have reviewed the medical record, interviewed the patient and family, and examined the patient. The following aspects are pertinent.  Past Medical History:  Diagnosis Date  . HTN (hypertension)   . Vision abnormalities    Social History   Socioeconomic History  . Marital status: Married    Spouse name: Not on file  . Number of children: Not on file  . Years of education: Not on file  . Highest education level: Not on file  Occupational History  . Not on file  Tobacco Use  . Smoking status: Former Research scientist (life sciences)  . Smokeless tobacco: Never Used  Substance and Sexual Activity  . Alcohol use: Yes    Alcohol/week: 0.0 standard drinks    Comment: Rare--about twice per yr/fim  . Drug use: No  . Sexual activity: Not on file  Other Topics Concern  .  Not on file  Social History Narrative  . Not on file   Social Determinants of Health   Financial Resource Strain:   . Difficulty of Paying Living Expenses: Not on file  Food Insecurity:   . Worried About Charity fundraiser in the Last Year: Not on file  . Ran Out of Food in the Last Year: Not on file  Transportation Needs:   . Lack of Transportation (Medical): Not on file  . Lack of Transportation (Non-Medical): Not on file  Physical Activity:   . Days of Exercise per Week: Not on file  . Minutes of Exercise per Session: Not on file  Stress:   . Feeling of Stress : Not on file  Social  Connections:   . Frequency of Communication with Friends and Family: Not on file  . Frequency of Social Gatherings with Friends and Family: Not on file  . Attends Religious Services: Not on file  . Active Member of Clubs or Organizations: Not on file  . Attends Archivist Meetings: Not on file  . Marital Status: Not on file   Scheduled Meds: . enoxaparin (LOVENOX) injection  30 mg Subcutaneous Q24H  . escitalopram  5 mg Oral Daily  . lamoTRIgine  150 mg Oral BID  . lidocaine  2 patch Transdermal Q24H  . oxybutynin  5 mg Oral BID   Continuous Infusions: . sodium chloride     PRN Meds:.sodium chloride, acetaminophen, ipratropium-albuterol, ondansetron (ZOFRAN) IV Medications Prior to Admission:  Prior to Admission medications   Medication Sig Start Date End Date Taking? Authorizing Provider  escitalopram (LEXAPRO) 5 MG tablet Take 5 mg by mouth daily.  11/27/19  Yes [provider]  lamoTRIgine (LAMICTAL) 150 MG tablet TAKE 1 TABLET BY MOUTH TWICE A DAY Patient taking differently: Take 150 mg by mouth 2 (two) times daily.  12/07/19  Yes Sater, Nanine Means, MD  lidocaine (LIDODERM) 5 % Place 1 patch onto the skin daily. Remove & Discard patch within 12 hours or as directed by MD 12/12/19  Yes Shelly Coss, MD  meloxicam (MOBIC) 7.5 MG tablet Take 7.5 mg by mouth daily.   Yes  [provider]  oxybutynin (DITROPAN) 5 MG tablet TAKE 1 TABLET BY MOUTH TWICE A DAY Patient taking differently: Take 5 mg by mouth 2 (two) times daily.  11/23/19  Yes Sater, Nanine Means, MD  collagenase (SANTYL) ointment Apply topically daily. Patient not taking: Reported on 02/03/2020 12/12/19   Shelly Coss, MD   No Known Allergies Review of Systems  Constitutional: Positive for activity change and fatigue.    Physical Exam Vitals and nursing note reviewed.  Constitutional:      Appearance: She is well-developed.  Pulmonary:     Effort: Pulmonary effort is normal.  Neurological:     Mental Status: She is alert and oriented to person, place, and time.     Motor: Weakness (generalized) present.  Psychiatric:        Mood and Affect: Mood normal.        Behavior: Behavior normal.     Vital Signs: BP (!) 102/55 (BP Location: Right Arm)   Pulse 74   Temp 97.9 F (36.6 C) (Oral)   Resp 16   Ht 5' (1.524 m)   Wt 84.2 kg   SpO2 98%   BMI 36.25 kg/m  Pain Scale: 0-10 POSS *See Group Information*: 1-Acceptable,Awake and alert Pain Score: Asleep   SpO2: SpO2: 98 % O2 Device:SpO2: 98 % O2 Flow Rate: .O2 Flow Rate (L/min): 2 L/min  IO: Intake/output summary:   Intake/Output Summary (Last 24 hours) at 02/11/2020 1334 Last data filed at 02/11/2020 0847 Gross per 24 hour  Intake --  Output 850 ml  Net -850 ml    LBM: Last BM Date: 02/06/20 Baseline Weight: Weight: 84.2 kg Most recent weight: Weight: 84.2 kg     Palliative Assessment/Data: PPS: 50%     Thank you for this consult. Palliative medicine will continue to follow and assist as needed.   Time In: 1300 Time Out: 1419 Time Total: 79 mins Greater than 50%  of this time was spent counseling and coordinating care related to the above assessment and plan.  Signed by: Mariana Kaufman,  AGNP-C Palliative Medicine    Please contact Palliative Medicine Team phone at [phone] for questions and concerns.   For individual provider: See Amion             +

## 2020-02-12 ENCOUNTER — Encounter (HOSPITAL_COMMUNITY): Payer: Self-pay | Admitting: Internal Medicine

## 2020-02-12 ENCOUNTER — Inpatient Hospital Stay (HOSPITAL_COMMUNITY): Payer: PPO

## 2020-02-12 ENCOUNTER — Other Ambulatory Visit: Payer: Self-pay

## 2020-02-12 DIAGNOSIS — J9601 Acute respiratory failure with hypoxia: Secondary | ICD-10-CM | POA: Diagnosis not present

## 2020-02-12 LAB — CBC WITH DIFFERENTIAL/PLATELET
Abs Immature Granulocytes: 0.51 10*3/uL — ABNORMAL HIGH (ref 0.00–0.07)
Basophils Absolute: 0.1 10*3/uL (ref 0.0–0.1)
Basophils Relative: 1 %
Eosinophils Absolute: 0.1 10*3/uL (ref 0.0–0.5)
Eosinophils Relative: 1 %
HCT: 30 % — ABNORMAL LOW (ref 36.0–46.0)
Hemoglobin: 9 g/dL — ABNORMAL LOW (ref 12.0–15.0)
Immature Granulocytes: 6 %
Lymphocytes Relative: 12 %
Lymphs Abs: 1.1 10*3/uL (ref 0.7–4.0)
MCH: 28.2 pg (ref 26.0–34.0)
MCHC: 30 g/dL (ref 30.0–36.0)
MCV: 94 fL (ref 80.0–100.0)
Monocytes Absolute: 0.4 10*3/uL (ref 0.1–1.0)
Monocytes Relative: 5 %
Neutro Abs: 6.8 10*3/uL (ref 1.7–7.7)
Neutrophils Relative %: 75 %
Platelets: 316 10*3/uL (ref 150–400)
RBC: 3.19 MIL/uL — ABNORMAL LOW (ref 3.87–5.11)
RDW: 15.6 % — ABNORMAL HIGH (ref 11.5–15.5)
WBC: 9 10*3/uL (ref 4.0–10.5)
nRBC: 0 % (ref 0.0–0.2)

## 2020-02-12 LAB — BASIC METABOLIC PANEL
Anion gap: 9 (ref 5–15)
BUN: 16 mg/dL (ref 8–23)
CO2: 32 mmol/L (ref 22–32)
Calcium: 8.6 mg/dL — ABNORMAL LOW (ref 8.9–10.3)
Chloride: 92 mmol/L — ABNORMAL LOW (ref 98–111)
Creatinine, Ser: 1.5 mg/dL — ABNORMAL HIGH (ref 0.44–1.00)
GFR, Estimated: 35 mL/min — ABNORMAL LOW (ref >60)
Glucose, Bld: 89 mg/dL (ref 70–99)
Potassium: 4.9 mmol/L (ref 3.5–5.1)
Sodium: 133 mmol/L — ABNORMAL LOW (ref 135–145)

## 2020-02-12 MED ORDER — MIDAZOLAM HCL 2 MG/2ML IJ SOLN
INTRAMUSCULAR | Status: AC
  Filled 2020-02-12: qty 2

## 2020-02-12 MED ORDER — MIDAZOLAM HCL 2 MG/2ML IJ SOLN
INTRAMUSCULAR | Status: AC | PRN
  Administered 2020-02-12 (×2): 0.5 mg via INTRAVENOUS

## 2020-02-12 MED ORDER — FENTANYL CITRATE (PF) 100 MCG/2ML IJ SOLN
INTRAMUSCULAR | Status: AC
  Filled 2020-02-12: qty 2

## 2020-02-12 MED ORDER — FENTANYL CITRATE (PF) 100 MCG/2ML IJ SOLN
INTRAMUSCULAR | Status: AC | PRN
Start: 2020-02-12 — End: 2020-02-12
  Administered 2020-02-12 (×2): 25 ug via INTRAVENOUS

## 2020-02-12 MED ORDER — SODIUM CHLORIDE 0.9 % IV SOLN
INTRAVENOUS | Status: AC
  Filled 2020-02-12: qty 250

## 2020-02-12 MED ORDER — LIDOCAINE HCL (PF) 1 % IJ SOLN
INTRAMUSCULAR | Status: AC | PRN
  Administered 2020-02-12: 10 mL

## 2020-02-12 NOTE — Progress Notes (Signed)
PHYSICAL THERAPY  Pt declined any OOB activity despite much encouragement.  C/o feeling "poorly".  Insurance has declined SNF and she truly can not care for herself. Will continue to attempt during her acute stay.  Rica Koyanagi  PTA Acute  Rehabilitation Services Pager      [phone] Office      [phone]

## 2020-02-12 NOTE — Progress Notes (Signed)
PROGRESS NOTE  IO DIEUJUSTE IFO:[id number] DOB: 1940-12-24 DOA: 02/03/2020 PCP: Donald Prose, MD  HPI/Recap of past 24 hours: 79 y.o.femalewith medical history significant ofhypertension, recurrent UTI, chronic kidney disease stage III, excessive daytime sleepiness, gait abnormalities, visual changes, who came in from home with shortness of breath and weakness. Patient noticed progressive shortness of breath over the last 3 to 4 days. Associated with some cough. She felt very weak. No fever or chills. She has been weak that she has been mostly in bed. She came to the ER where she was evaluated. Patient was noted to be mildly hypoxic. She also requires about 2 L of oxygen now. She is COVID-19 negative but has evidence of pneumonia versus CHF. Her BNP is elevated but also has leukocytosis and infiltrates that could be technically speaking and pneumonia. She is got elevated creatinine which is slightly above her baseline. She is therefore being admitted to the hospital for acute respiratory failure probably pneumonia but could be CHF as well..  ED Course:Temperature 98.1 blood pressure 130/59 pulse108respiratory rate of 24 oxygen sats 93% on 2 L. White count is 18.2, hemoglobin 11.3, platelets 238 sodium 132 potassium 3.2 chloride 96 CO2 22 BUN 37 creatinine 2.05 and calcium 8.5. Glucose 139. COVID-19 screen is negative.Urinalysis showed many bacteria with WBC 21-50 but negative leukocytes and nitrite. Probably asymptomatic bacteriuria. Chest x-ray showed left basilar opacity consistent with atelectasis versus infiltrate. BNP is 382. Patient being admitted with pneumonia as well as suspected cardiac cause.  02/12/20: She has no new complaints this morning.  Denies any pain.  Post aspirate and core biopsy performed of bone marrow in the right iliac bone on 02/12/2020 by interventional radiology Dr. Kathlene Cote.  Medical oncology Dr. Alvy Bimler  following.   Assessment/Plan: Principal Problem:   Acute respiratory failure with hypoxia (HCC) Active Problems:   Spinal stenosis, lumbar   ARF (acute renal failure) (HCC)   Hyponatremia   Leucocytosis   Hypokalemia   Elevated brain natriuretic peptide (BNP) level   Sepsis ([place])   Metastatic cancer to bone [facility])   Advanced care planning/counseling discussion   Goals of care, counseling/discussion   Palliative care by specialist   Sepsis, resolved, present on admission due to CAP:  She was tachypneic tachycardic and febrile at 103.1 with lactic acidosis and leukocytosis.  Lactic acid level was 2.2 down to 1.1 Chest x-ray with left basilar opacity consistent with atelectasis or infiltrates. She was treated with Rocephin and azithromycin, completed course. Follow-up blood cultures-staph epidermidis thought to be a contaminant. Influenza negative Covid negative. MRSA PCR negative.  Patient became hypoxic on 02/08/2020 requiring 6 L of oxygen.  She received multiple doses of IV Lasix.    Widespread sclerotic bony lesions: No definite primary localized on pan-CT scanning.  - BM biopsy/aspiration completed by IR on 02/12/2020, follow results. -Medical oncology Dr. Alvy Bimler following, appreciate assistance.  Acute hypoxic respiratory failure secondary to community-acquired pneumonia versus atelectasis Completed course of empiric antibiotics for CAP, Rocephin and azithromycin. Not on oxygen supplementation at baseline Currently on 2 L with O2 saturation 100% Start incentive spirometer Will obtain home O2 evaluation for DC planning  Right > Left hydronephrosis:  -Urology consulted.  -Preserved renal function. -Continue to monitor urine output -Repeat renal panel  Tree-in-bud opacities on CT scan:  - Pulmonary consulted. With no sputum for culture/AFB and improving respiratory symptoms, 3 month repeat CT with consideration for bronchoscopy at that time is recommended. No  evidence of primary pulmonary malignancy.  Euvolemic  hyponatremia Serum sodium 133 Continue to monitor  Resolved AKI on CKD stage IIIb: She appears to be at her baseline creatinine 1.5 with GFR of 35 Avoid nephrotoxins Monitor urine output  Chronic diastolic CHF Echo 76/19/5093 with ejection fraction 65 to 70% with normal LV function no regional wall motion abnormalities.  Grade 1 diastolic dysfunction. IVC normal caliber and phasicity.  - Monitor volume status clinically.  -BNP 557 on 02/04/2020 -Euvolemic on exam at the time of this visit  Mildly elevated troponin likely secondary to demand ischemia Denies any anginal symptoms at the time of this visit Troponin S peaked at 61 on 02/04/2020 Continue to monitor on telemetry  Physical debility/ambulatory function Continue PT OT Mobilize as tolerated Continue fall precautions TOC assisting with home health services, for DC planning  Obesity: Estimated body mass index is 36.25 kg/m as calculated from the following:   Height as of this encounter: 5' (1.524 m).   Weight as of this encounter: 84.2 kg.  DVT prophylaxis:  Subcu Lovenox daily Code Status:DNR Family Communication: None at bedside.  Consultants:   Oncology, Dr. Alvy Bimler  Pulmonology  Palliative Care  Urology  Procedures: None Antimicrobials: Rocephin and azithromycin    Status is: Inpatient   Dispo: The patient is from: Home.              Anticipated d/c is to: Home with home health services               Anticipated d/c date is: 02/14/2020.              Patient currently not stable for discharge due to ongoing work-up and management of suspected metastatic cancer.        Objective: Vitals:   02/12/20 1110 02/12/20 1115 02/12/20 1141 02/12/20 1321  BP: 137/70 (!) 144/71 130/62 (!) 104/59  Pulse: 75 74 68 61  Resp: _0 Temp:   97.7 F (36.5 C) 98.1 F (36.7 C)  TempSrc:   Oral   SpO2: 100% 100% 100% 100%  Weight:       Height:        Intake/Output Summary (Last 24 hours) at 02/12/2020 1744 Last data filed at 02/12/2020 0500 Gross per 24 hour  Intake 120 ml  Output 600 ml  Net -480 ml   Filed Weights   02/04/20 1300  Weight: 84.2 kg    Exam:  . General: 79 y.o. year-old female well developed well nourished in no acute distress.  Alert and oriented x3. . Cardiovascular: Regular rate and rhythm with no rubs or gallops.  No thyromegaly or JVD noted.   Marland Kitchen Respiratory: Clear to auscultation with no wheezes or rales. Good inspiratory effort. . Abdomen: Soft nontender nondistended with normal bowel sounds x4 quadrants. . Musculoskeletal: No lower extremity edema bilaterally. Marland Kitchen Psychiatry: Mood is appropriate for condition and setting   Data Reviewed: CBC: Recent Labs  Lab 02/06/20 0408 02/07/20 0321 02/08/20 0418 02/12/20 0315  WBC 15.4* 11.5* 10.6* 9.0  NEUTROABS  --   --   --  6.8  HGB 8.5* 8.9* 9.7* 9.0*  HCT 27.2* 28.3* 30.8* 30.0*  MCV 91.3 92.2 90.1 94.0  PLT 152 171 204 267   Basic Metabolic Panel: Recent Labs  Lab 02/06/20 0408 02/07/20 0321 02/08/20 0418 02/10/20 0506 02/12/20 0315  NA 137 136 135  --  133*  K 3.0* 3.8 4.4  --  4.9  CL 105 105 97*  --  92*  CO2 22  24 28  --  32  GLUCOSE 146* 103* 89  --  89  BUN 37* 26* 23  --  16  CREATININE 1.54* 1.60* 1.62* 1.45* 1.50*  CALCIUM 8.4* 8.1* 8.6*  --  8.6*   GFR: Estimated Creatinine Clearance: 28.8 mL/min (A) (by C-G formula based on SCr of 1.5 mg/dL (H)). Liver Function Tests: Recent Labs  Lab 02/06/20 0408 02/07/20 0321 02/08/20 0418  AST 21 18 14*  ALT _0 ALKPHOS 120 105 111  BILITOT 0.5 0.3 0.5  PROT 5.6* 5.5* 6.3*  ALBUMIN 2.2* 2.1* 2.5*   No results for input(s): LIPASE, AMYLASE in the last 168 hours. No results for input(s): AMMONIA in the last 168 hours. Coagulation Profile: No results for input(s): INR, PROTIME in the last 168 hours. Cardiac Enzymes: No results for input(s): CKTOTAL,  CKMB, CKMBINDEX, TROPONINI in the last 168 hours. BNP (last 3 results) No results for input(s): PROBNP in the last 8760 hours. HbA1C: No results for input(s): HGBA1C in the last 72 hours. CBG: No results for input(s): GLUCAP in the last 168 hours. Lipid Profile: No results for input(s): CHOL, HDL, LDLCALC, TRIG, CHOLHDL, LDLDIRECT in the last 72 hours. Thyroid Function Tests: No results for input(s): TSH, T4TOTAL, FREET4, T3FREE, THYROIDAB in the last 72 hours. Anemia Panel: Recent Labs    02/11/20 0311  VITAMINB12 634  FERRITIN 254  TIBC 191*  IRON 29  RETICCTPCT 1.2   Urine analysis:    Component Value Date/Time   COLORURINE AMBER (A) 02/03/2020 1812   APPEARANCEUR HAZY (A) 02/03/2020 1812   LABSPEC 1.020 02/03/2020 1812   PHURINE 5.0 02/03/2020 1812   GLUCOSEU NEGATIVE 02/03/2020 1812   HGBUR MODERATE (A) 02/03/2020 1812   BILIRUBINUR NEGATIVE 02/03/2020 1812   KETONESUR 5 (A) 02/03/2020 1812   PROTEINUR 100 (A) 02/03/2020 1812   NITRITE NEGATIVE 02/03/2020 1812   LEUKOCYTESUR TRACE (A) 02/03/2020 1812   Sepsis Labs: _1 (procalcitonin:4,lacticidven:4)  ) Recent Results (from the past 240 hour(s))  Resp Panel by RT PCR (RSV, Flu A&B, Covid) - Nasopharyngeal Swab     Status: None   Collection Time: 02/03/20  6:25 PM   Specimen: Nasopharyngeal Swab  Result Value Ref Range Status   SARS Coronavirus 2 by RT PCR NEGATIVE NEGATIVE Final    Comment: (NOTE) SARS-CoV-2 target nucleic acids are NOT DETECTED.  The SARS-CoV-2 RNA is generally detectable in upper respiratoy specimens during the acute phase of infection. The lowest concentration of SARS-CoV-2 viral copies this assay can detect is 131 copies/mL. A negative result does not preclude SARS-Cov-2 infection and should not be used as the sole basis for treatment or other patient management decisions. A negative result may occur with  improper specimen collection/handling, submission of specimen other than  nasopharyngeal swab, presence of viral mutation(s) within the areas targeted by this assay, and inadequate number of viral copies (<131 copies/mL). A negative result must be combined with clinical observations, patient history, and epidemiological information. The expected result is Negative.  Fact Sheet for Patients:  PinkCheek.be  Fact Sheet for Healthcare Providers:  GravelBags.it  This test is no t yet approved or cleared by the [place] FDA and  has been authorized for detection and/or diagnosis of SARS-CoV-2 by FDA under an Emergency Use Authorization (EUA). This EUA will remain  in effect (meaning this test can be used) for the duration of the COVID-19 declaration under Section 564(b)(1) of the Act, 21 U.S.C. section 360bbb-3(b)(1), unless the authorization is terminated  or revoked sooner.     Influenza A by PCR NEGATIVE NEGATIVE Final   Influenza B by PCR NEGATIVE NEGATIVE Final    Comment: (NOTE) The Xpert Xpress SARS-CoV-2/FLU/RSV assay is intended as an aid in  the diagnosis of influenza from Nasopharyngeal swab specimens and  should not be used as a sole basis for treatment. Nasal washings and  aspirates are unacceptable for Xpert Xpress SARS-CoV-2/FLU/RSV  testing.  Fact Sheet for Patients: PinkCheek.be  Fact Sheet for Healthcare Providers: GravelBags.it  This test is not yet approved or cleared by the [place] FDA and  has been authorized for detection and/or diagnosis of SARS-CoV-2 by  FDA under an Emergency Use Authorization (EUA). This EUA will remain  in effect (meaning this test can be used) for the duration of the  Covid-19 declaration under Section 564(b)(1) of the Act, 21  U.S.C. section 360bbb-3(b)(1), unless the authorization is  terminated or revoked.    Respiratory Syncytial Virus by PCR NEGATIVE NEGATIVE Final    Comment:  (NOTE) Fact Sheet for Patients: PinkCheek.be  Fact Sheet for Healthcare Providers: GravelBags.it  This test is not yet approved or cleared by the [place] FDA and  has been authorized for detection and/or diagnosis of SARS-CoV-2 by  FDA under an Emergency Use Authorization (EUA). This EUA will remain  in effect (meaning this test can be used) for the duration of the  COVID-19 declaration under Section 564(b)(1) of the Act, 21 U.S.C.  section 360bbb-3(b)(1), unless the authorization is terminated or  revoked. Performed at [facility], [place] [address] [place], [place] [address]   Culture, blood (routine x 2) Call MD if unable to obtain prior to antibiotics being given     Status: Abnormal   Collection Time: 02/04/20  6:45 AM   Specimen: BLOOD  Result Value Ref Range Status   Specimen Description   Final    BLOOD LEFT ANTECUBITAL Performed at [facility] [address], [place], [place] [address]    Special Requests   Final    BOTTLES DRAWN AEROBIC AND ANAEROBIC Blood Culture adequate volume Performed at [facility] [address], [place], [place] [address]    Culture  Setup Time   Final    AEROBIC BOTTLE ONLY GRAM POSITIVE COCCI Organism ID to follow CRITICAL RESULT CALLED TO, READ BACK BY AND VERIFIED WITH: E JACKSON PHARMD 02/06/20 0210 JDW    Culture (A)  Final    STAPHYLOCOCCUS EPIDERMIDIS THE SIGNIFICANCE OF ISOLATING THIS ORGANISM FROM A SINGLE SET OF BLOOD CULTURES WHEN MULTIPLE SETS ARE DRAWN IS UNCERTAIN. PLEASE NOTIFY THE MICROBIOLOGY DEPARTMENT WITHIN ONE WEEK IF SPECIATION AND SENSITIVITIES ARE REQUIRED. Performed at [facility], [place] [address], [place], [place] [address]    Report Status 02/06/2020 FINAL  Final  Blood Culture ID Panel (Reflexed)     Status: Abnormal   Collection Time: 02/04/20  6:45 AM  Result Value Ref Range Status    Enterococcus faecalis NOT DETECTED NOT DETECTED Final   Enterococcus Faecium NOT DETECTED NOT DETECTED Final   Listeria monocytogenes NOT DETECTED NOT DETECTED Final   Staphylococcus species DETECTED (A) NOT DETECTED Final    Comment: CRITICAL RESULT CALLED TO, READ BACK BY AND VERIFIED WITH: E JACKSON PHARMD 02/06/20 0210 JDW    Staphylococcus aureus (BCID) NOT DETECTED NOT DETECTED Final   Staphylococcus epidermidis DETECTED (A) NOT DETECTED Final    Comment: Methicillin (oxacillin) resistant coagulase negative staphylococcus. Possible blood culture  contaminant (unless isolated from more than one blood culture draw or clinical case suggests pathogenicity). No antibiotic treatment is indicated for blood  culture contaminants. CRITICAL RESULT CALLED TO, READ BACK BY AND VERIFIED WITH: E JACKSON PHARMD 02/06/20 0210 JDW    Staphylococcus lugdunensis NOT DETECTED NOT DETECTED Final   Streptococcus species NOT DETECTED NOT DETECTED Final   Streptococcus agalactiae NOT DETECTED NOT DETECTED Final   Streptococcus pneumoniae NOT DETECTED NOT DETECTED Final   Streptococcus pyogenes NOT DETECTED NOT DETECTED Final   A.calcoaceticus-baumannii NOT DETECTED NOT DETECTED Final   Bacteroides fragilis NOT DETECTED NOT DETECTED Final   Enterobacterales NOT DETECTED NOT DETECTED Final   Enterobacter cloacae complex NOT DETECTED NOT DETECTED Final   Escherichia coli NOT DETECTED NOT DETECTED Final   Klebsiella aerogenes NOT DETECTED NOT DETECTED Final   Klebsiella oxytoca NOT DETECTED NOT DETECTED Final   Klebsiella pneumoniae NOT DETECTED NOT DETECTED Final   Proteus species NOT DETECTED NOT DETECTED Final   Salmonella species NOT DETECTED NOT DETECTED Final   Serratia marcescens NOT DETECTED NOT DETECTED Final   Haemophilus influenzae NOT DETECTED NOT DETECTED Final   Neisseria meningitidis NOT DETECTED NOT DETECTED Final   Pseudomonas aeruginosa NOT DETECTED NOT DETECTED Final    Stenotrophomonas maltophilia NOT DETECTED NOT DETECTED Final   Candida albicans NOT DETECTED NOT DETECTED Final   Candida auris NOT DETECTED NOT DETECTED Final   Candida glabrata NOT DETECTED NOT DETECTED Final   Candida krusei NOT DETECTED NOT DETECTED Final   Candida parapsilosis NOT DETECTED NOT DETECTED Final   Candida tropicalis NOT DETECTED NOT DETECTED Final   Cryptococcus neoformans/gattii NOT DETECTED NOT DETECTED Final   Methicillin resistance mecA/C DETECTED (A) NOT DETECTED Final    Comment: CRITICAL RESULT CALLED TO, READ BACK BY AND VERIFIED WITHSeleta Rhymes [facility] 02/06/20 0210 JDW Performed at [facility], [address], [place], [place] [address]   Culture, blood (routine x 2) Call MD if unable to obtain prior to antibiotics being given     Status: None   Collection Time: 02/04/20  7:10 AM   Specimen: BLOOD RIGHT HAND  Result Value Ref Range Status   Specimen Description   Final    BLOOD RIGHT HAND Performed at [facility] [address] [place], [place] [address]    Special Requests   Final    BOTTLES DRAWN AEROBIC AND ANAEROBIC Blood Culture adequate volume Performed at [facility] [address] [place], [place] [address]    Culture   Final    NO GROWTH 5 DAYS Performed at [facility], [place] [address] [place], [place] [address]    Report Status 02/09/2020 FINAL  Final  MRSA PCR Screening     Status: None   Collection Time: 02/05/20  1:37 PM   Specimen: Nasal Mucosa; Nasopharyngeal  Result Value Ref Range Status   MRSA by PCR NEGATIVE NEGATIVE Final    Comment:        The GeneXpert MRSA Assay (FDA approved for NASAL specimens only), is one component of a comprehensive MRSA colonization surveillance program. It is not intended to diagnose MRSA infection nor to guide or monitor treatment for MRSA infections. Performed at [facility], [place] [address] [place], [place] [address]    SARS Coronavirus 2 by RT PCR (hospital order, performed in [facility] hospital lab) Nasopharyngeal Nasopharyngeal Swab     Status: None   Collection Time: 02/08/20  4:25 PM  Specimen: Nasopharyngeal Swab  Result Value Ref Range Status   SARS Coronavirus 2 NEGATIVE NEGATIVE Final    Comment: (NOTE) SARS-CoV-2 target nucleic acids are NOT DETECTED.  The SARS-CoV-2 RNA is generally detectable in upper and lower respiratory specimens during the acute phase of infection. The lowest concentration of SARS-CoV-2 viral copies this assay can detect is 250 copies / mL. A negative result does not preclude SARS-CoV-2 infection and should not be used as the sole basis for treatment or other patient management decisions.  A negative result may occur with improper specimen collection / handling, submission of specimen other than nasopharyngeal swab, presence of viral mutation(s) within the areas targeted by this assay, and inadequate number of viral copies (<250 copies / mL). A negative result must be combined with clinical observations, patient history, and epidemiological information.  Fact Sheet for Patients:   StrictlyIdeas.no  Fact Sheet for Healthcare Providers: BankingDealers.co.za  This test is not yet approved or  cleared by the [place] FDA and has been authorized for detection and/or diagnosis of SARS-CoV-2 by FDA under an Emergency Use Authorization (EUA).  This EUA will remain in effect (meaning this test can be used) for the duration of the COVID-19 declaration under Section 564(b)(1) of the Act, 21 U.S.C. section 360bbb-3(b)(1), unless the authorization is terminated or revoked sooner.  Performed at [facility], [place] [address] [place], [place] [address]       Studies: CT BIOPSY  Result Date: 03/10/20 CLINICAL DATA:  Diffuse sclerotic bony metastatic lesions with unknown primary. EXAM: CT GUIDED  BONE MARROW ASPIRATION AND BIOPSY ANESTHESIA/SEDATION: Versed 1.0 mg IV, Fentanyl 100 mcg IV Total Moderate Sedation Time:   22 minutes. The patient's level of consciousness and physiologic status were continuously monitored during the procedure by Radiology nursing. PROCEDURE: The procedure risks, benefits, and alternatives were explained to the patient. Questions regarding the procedure were encouraged and answered. The patient understands and consents to the procedure. A time out was performed prior to initiating the procedure. The right gluteal region was prepped with chlorhexidine. Sterile gown and sterile gloves were used for the procedure. Local anesthesia was provided with 1% Lidocaine. Under CT guidance, an 11 gauge On Control bone cutting needle was advanced from a posterior approach into the right iliac bone. Needle positioning was confirmed with CT. Initial non heparinized and heparinized aspirate samples were obtained of bone marrow. Core biopsy was performed via the On Control drill needle. COMPLICATIONS: None FINDINGS: Initial CT through the bony pelvis demonstrates multiple sclerotic lesions involving bilateral iliac bones, both sides of the sacrum and bilateral ischia. Inspection of initial aspirate did reveal visible particles. Intact core biopsy sample was obtained. The core sample was obtained through the level of sclerotic involvement of the right iliac bone. IMPRESSION: CT guided bone marrow biopsy of right posterior iliac bone with both aspirate and core samples obtained. Electronically Signed   By: Aletta Edouard M.D.   On: 03/10/2020 13:13   CT BONE MARROW BIOPSY & ASPIRATION  Result Date: 03-10-2020 CLINICAL DATA:  Diffuse sclerotic bony metastatic lesions with unknown primary. EXAM: CT GUIDED BONE MARROW ASPIRATION AND BIOPSY ANESTHESIA/SEDATION: Versed 1.0 mg IV, Fentanyl 100 mcg IV Total Moderate Sedation Time:   22 minutes. The patient's level of consciousness and physiologic  status were continuously monitored during the procedure by Radiology nursing. PROCEDURE: The procedure risks, benefits, and alternatives were explained to the patient. Questions regarding the procedure were encouraged and answered. The patient understands and consents  to the procedure. A time out was performed prior to initiating the procedure. The right gluteal region was prepped with chlorhexidine. Sterile gown and sterile gloves were used for the procedure. Local anesthesia was provided with 1% Lidocaine. Under CT guidance, an 11 gauge On Control bone cutting needle was advanced from a posterior approach into the right iliac bone. Needle positioning was confirmed with CT. Initial non heparinized and heparinized aspirate samples were obtained of bone marrow. Core biopsy was performed via the On Control drill needle. COMPLICATIONS: None FINDINGS: Initial CT through the bony pelvis demonstrates multiple sclerotic lesions involving bilateral iliac bones, both sides of the sacrum and bilateral ischia. Inspection of initial aspirate did reveal visible particles. Intact core biopsy sample was obtained. The core sample was obtained through the level of sclerotic involvement of the right iliac bone. IMPRESSION: CT guided bone marrow biopsy of right posterior iliac bone with both aspirate and core samples obtained. Electronically Signed   By: Aletta Edouard M.D.   On: 02/12/2020 13:13    Scheduled Meds: . enoxaparin (LOVENOX) injection  30 mg Subcutaneous Q24H  . escitalopram  5 mg Oral Daily  . fentaNYL      . lamoTRIgine  150 mg Oral BID  . lidocaine  2 patch Transdermal Q24H  . midazolam      . oxybutynin  5 mg Oral BID    Continuous Infusions: . sodium chloride    . sodium chloride       LOS: 9 days     Kayleen Memos, MD [facility] Pager [phone]  If 7PM-7AM, please contact night-coverage [email or web address] Password [facility] 02/12/2020, 5:44 PM

## 2020-02-12 NOTE — Procedures (Signed)
Interventional Radiology Procedure Note  Procedure: CT guided bone marrow aspiration and biopsy  Complications: None  EBL: < 10 mL  Findings: Aspirate and core biopsy performed of bone marrow in right iliac bone.  Plan: Bedrest supine x 1 hrs  Kathleen Schneider T. Kathleen Schneider, M.D Pager:  [phone]   

## 2020-02-13 DIAGNOSIS — J9601 Acute respiratory failure with hypoxia: Secondary | ICD-10-CM | POA: Diagnosis not present

## 2020-02-13 LAB — BASIC METABOLIC PANEL
Anion gap: 8 (ref 5–15)
BUN: 20 mg/dL (ref 8–23)
CO2: 31 mmol/L (ref 22–32)
Calcium: 8.4 mg/dL — ABNORMAL LOW (ref 8.9–10.3)
Chloride: 93 mmol/L — ABNORMAL LOW (ref 98–111)
Creatinine, Ser: 1.76 mg/dL — ABNORMAL HIGH (ref 0.44–1.00)
GFR, Estimated: 29 mL/min — ABNORMAL LOW (ref >60)
Glucose, Bld: 107 mg/dL — ABNORMAL HIGH (ref 70–99)
Potassium: 4.3 mmol/L (ref 3.5–5.1)
Sodium: 132 mmol/L — ABNORMAL LOW (ref 135–145)

## 2020-02-13 LAB — CBC
HCT: 30.3 % — ABNORMAL LOW (ref 36.0–46.0)
Hemoglobin: 9.2 g/dL — ABNORMAL LOW (ref 12.0–15.0)
MCH: 28.4 pg (ref 26.0–34.0)
MCHC: 30.4 g/dL (ref 30.0–36.0)
MCV: 93.5 fL (ref 80.0–100.0)
Platelets: 345 10*3/uL (ref 150–400)
RBC: 3.24 MIL/uL — ABNORMAL LOW (ref 3.87–5.11)
RDW: 15.3 % (ref 11.5–15.5)
WBC: 9.1 10*3/uL (ref 4.0–10.5)
nRBC: 0 % (ref 0.0–0.2)

## 2020-02-13 MED ORDER — SODIUM CHLORIDE 0.9 % IV SOLN: INTRAVENOUS | Status: AC

## 2020-02-13 NOTE — Progress Notes (Signed)
Occupational Therapy Treatment Patient Details Name: Kathleen Schneider MRN: [id number] DOB: October 20, 1940 Today's Date: 02/13/2020    History of present illness 79 yo female admitted with Pna, Sepsis after falling at home. Hx of CKD, gait abnormality, visual deficits, spinal stenosis   OT comments  Pt agreeable to in bed activity - grooming  Follow Up Recommendations  SNF    Equipment Recommendations  3 in 1 bedside commode                  ADL either performed or assessed with clinical judgement   ADL Overall ADL's : Needs assistance/impaired     Grooming: Wash/dry face;Bed level;Wash/dry hands;Oral care Grooming Details (indicate cue type and reason): declined sitting up or OOB but did agree to grooming.  Eyes closed much of session but pt did perform grooming willingly                                               Cognition Arousal/Alertness: Lethargic Behavior During Therapy: Flat affect Overall Cognitive Status: No family/caregiver present to determine baseline cognitive functioning                                 General Comments: pt did follow all 1 step commands with grooming task                   Pertinent Vitals/ Pain       Pain Assessment: No/denies pain         Frequency  Min 2X/week        Progress Toward Goals  OT Goals(current goals can now be found in the care plan section)  Progress towards OT goals: OT to reassess next treatment     Plan Discharge plan remains appropriate       AM-PAC OT "6 Clicks" Daily Activity     Outcome Measure   Help from another person eating meals?: A Little Help from another person taking care of personal grooming?: A Little Help from another person toileting, which includes using toliet, bedpan, or urinal?: A Lot Help from another person bathing (including washing, rinsing, drying)?: A Lot Help from another person to put on and taking off regular upper body clothing?:  A Little Help from another person to put on and taking off regular lower body clothing?: A Lot 6 Click Score: 15    End of Session Equipment Utilized During Treatment: Rolling walker;Oxygen;Gait belt Charlaine Dalton)  OT Visit Diagnosis: Unsteadiness on feet (R26.81);Muscle weakness (generalized) (M62.81);History of falling (Z91.81)   Activity Tolerance Patient limited by fatigue   Patient Left in bed;with call bell/phone within reach;with bed alarm set   Nurse Communication Mobility status (Nausea)        Time: 0938-1829 OT Time Calculation (min): 8 min  Charges: OT General Charges $OT Visit: 1 Visit OT Treatments $Self Care/Home Management : 8-22 mins  Kari Baars, [place] [phone] Office- [phone]      [place], Edwena Felty D 02/13/2020, 2:59 PM

## 2020-02-13 NOTE — Progress Notes (Signed)
PROGRESS NOTE  Kathleen Schneider:[id number] DOB: 1940/11/06 DOA: 02/03/2020 PCP: Donald Prose, MD  HPI/Recap of past 24 hours: 78 y.o.femalewith medical history significant ofhypertension, recurrent UTI, chronic kidney disease stage III, excessive daytime sleepiness, gait abnormalities, visual changes, who came in from home with shortness of breath and weakness. Patient noticed progressive shortness of breath over the last 3 to 4 days. Associated with some cough. She felt very weak. No fever or chills. She has been weak that she has been mostly in bed. She came to the ER where she was evaluated. Patient was noted to be mildly hypoxic. She also requires about 2 L of oxygen now. She is COVID-19 negative but has evidence of pneumonia versus CHF. Her BNP is elevated but also has leukocytosis and infiltrates that could be technically speaking and pneumonia. She is got elevated creatinine which is slightly above her baseline. She is therefore being admitted to the hospital for acute respiratory failure probably pneumonia but could be CHF as well..  ED Course:Temperature 98.1 blood pressure 130/59 pulse108respiratory rate of 24 oxygen sats 93% on 2 L. White count is 18.2, hemoglobin 11.3, platelets 238 sodium 132 potassium 3.2 chloride 96 CO2 22 BUN 37 creatinine 2.05 and calcium 8.5. Glucose 139. COVID-19 screen is negative.Urinalysis showed many bacteria with WBC 21-50 but negative leukocytes and nitrite. Probably asymptomatic bacteriuria. Chest x-ray showed left basilar opacity consistent with atelectasis versus infiltrate. BNP is 382. Patient being admitted with pneumonia as well as suspected cardiac cause.  Post aspirate and core biopsy performed of bone marrow in the right iliac bone on 02/12/2020 by interventional radiology Dr. Kathlene Cote.  Medical oncology Dr. Alvy Bimler following.  02/13/20: No acute events overnight.  She reports poor oral intake and generalized weakness.   Dry oral mucous membrane on exam.  Started on IV fluid normal saline at 50 cc/h.  Denies any pain at the time of this visit.   Assessment/Plan: Principal Problem:   Acute respiratory failure with hypoxia (HCC) Active Problems:   Spinal stenosis, lumbar   ARF (acute renal failure) (HCC)   Hyponatremia   Leucocytosis   Hypokalemia   Elevated brain natriuretic peptide (BNP) level   Sepsis ([place])   Metastatic cancer to bone [facility])   Advanced care planning/counseling discussion   Goals of care, counseling/discussion   Palliative care by specialist   Sepsis, resolved, present on admission due to CAP:  She presented tachypneic tachycardic and febrile at 103.1 with lactic acidosis and leukocytosis.  Chest x-ray with left basilar opacity consistent with atelectasis or infiltrates. She was treated with Rocephin and azithromycin, completed course. Follow-up blood cultures-staph epidermidis thought to be a contaminant. Influenza negative Covid negative. MRSA PCR negative.  Patient became hypoxic on 02/08/2020 requiring 6 L of oxygen.  She received multiple doses of IV Lasix.  Currently she is saturating 97% on 1 L.   Widespread sclerotic bony lesions: No definite primary localized on pan-CT scanning.  - BM biopsy/aspiration completed by IR on 02/12/2020, follow results. -Medical oncology Dr. Alvy Bimler following, appreciate assistance.  AKI on CKD 3B, likely prerenal in the setting of dehydration Baseline creatinine appears to be 1.01 with GFR of 35 Creatinine uptrending 1.76 Dry oral mucous membrane on exam Start IV fluid hydration normal saline at 50 cc/h x 2 days Encourage oral intake  Monitor urine output Avoid nephrotoxins Repeat renal panel  Hypovolemic hyponatremia Serum sodium 132 Normal saline at 50 cc/h Repeat BMP in the morning  Resolving acute hypoxic respiratory failure multifactorial  secondary to community-acquired pneumonia and atelectasis Completed course of empiric  antibiotics for CAP, Rocephin and azithromycin. Not on oxygen supplementation at baseline Currently on 1 L with O2 saturation 97%. Continue incentive spirometer Out of bed to chair, mobilize as tolerated.  Generalized weakness/physical debility Assessment PT OT with recommendation for SNF TOC assisting with disposition Continue PT OT with assistance and fall precautions.  Right > Left hydronephrosis:  -Urology consulted, no plan for surgical intervention at this time.  -Continue to monitor urine output Continue oxybutynin  Tree-in-bud opacities on CT scan:  - Pulmonary consulted. With no sputum for culture/AFB and improving respiratory symptoms, 3 month repeat CT with consideration for bronchoscopy at that time is recommended. No evidence of primary pulmonary malignancy.  Chronic diastolic CHF Echo 40/97/3532 with ejection fraction 65 to 70% with normal LV function no regional wall motion abnormalities.  Grade 1 diastolic dysfunction. IVC normal caliber and phasicity.  -BNP 557 on 02/04/2020 Monitor volume status while on gentle IV fluid. Continues with strict I's and O's and daily weight  Mildly elevated troponin likely secondary to demand ischemia Denies any anginal symptoms at the time of this visit Troponin S peaked at 61 on 02/04/2020 Continue to monitor on telemetry  Chronic depression anxiety Stable Continue home regimen  Obesity: Estimated body mass index is 36.25 kg/m as calculated from the following:   Height as of this encounter: 5' (1.524 m).   Weight as of this encounter: 84.2 kg.  DVT prophylaxis:  Subcu Lovenox daily Code Status:DNR Family Communication: None at bedside.  Consultants:   Oncology, Dr. Alvy Bimler  Pulmonology  Palliative Care  Urology  Procedures: None Antimicrobials:  Completed course of Rocephin and azithromycin    Status is: Inpatient   Dispo: The patient is from: Home.              Anticipated d/c is to: Home with home  health services               Anticipated d/c date is: 02/16/20              Patient currently not stable for discharge due to ongoing work-up and management of suspected metastatic cancer.        Objective: Vitals:   02/12/20 2037 02/12/20 2125 02/13/20 0410 02/13/20 1327  BP:  (!) 118/56 (!) 115/55 (!) 102/53  Pulse:  73 67 64  Resp:  18 17 16   Temp:  98.4 F (36.9 C) 98.4 F (36.9 C) 98.6 F (37 C)  TempSrc:  Oral Oral Oral  SpO2: 95% 100% 97% 97%  Weight:      Height:        Intake/Output Summary (Last 24 hours) at 02/13/2020 1601 Last data filed at 02/13/2020 0847 Gross per 24 hour  Intake --  Output 650 ml  Net -650 ml   Filed Weights   02/04/20 1300  Weight: 84.2 kg    Exam:  . General: 79 y.o. year-old female well-developed well-nourished no acute distress.  Alert and oriented x3.   . Cardiovascular: Regular rate and rhythm no rubs or gallops. Marland Kitchen Respiratory: Clear to auscultation no wheezes or rales. . Abdomen: Soft nontender normal bowel sounds present.  . Musculoskeletal: No lower extremity edema bilaterally. Marland Kitchen Psychiatry: Mood is appropriate for condition and setting.   Data Reviewed: CBC: Recent Labs  Lab 02/07/20 0321 02/08/20 0418 02/12/20 0315 02/13/20 0342  WBC 11.5* 10.6* 9.0 9.1  NEUTROABS  --   --  6.8  --  HGB 8.9* 9.7* 9.0* 9.2*  HCT 28.3* 30.8* 30.0* 30.3*  MCV 92.2 90.1 94.0 93.5  PLT 171 204 316 737   Basic Metabolic Panel: Recent Labs  Lab 02/07/20 0321 02/08/20 0418 02/10/20 0506 02/12/20 0315 02/13/20 0342  NA 136 135  --  133* 132*  K 3.8 4.4  --  4.9 4.3  CL 105 97*  --  92* 93*  CO2 24 28  --  32 31  GLUCOSE 103* 89  --  89 107*  BUN 26* 23  --  16 20  CREATININE 1.60* 1.62* 1.45* 1.50* 1.76*  CALCIUM 8.1* 8.6*  --  8.6* 8.4*   GFR: Estimated Creatinine Clearance: 24.6 mL/min (A) (by C-G formula based on SCr of 1.76 mg/dL (H)). Liver Function Tests: Recent Labs  Lab 02/07/20 0321 02/08/20 0418  AST 18  14*  ALT 19 17  ALKPHOS 105 111  BILITOT 0.3 0.5  PROT 5.5* 6.3*  ALBUMIN 2.1* 2.5*   No results for input(s): LIPASE, AMYLASE in the last 168 hours. No results for input(s): AMMONIA in the last 168 hours. Coagulation Profile: No results for input(s): INR, PROTIME in the last 168 hours. Cardiac Enzymes: No results for input(s): CKTOTAL, CKMB, CKMBINDEX, TROPONINI in the last 168 hours. BNP (last 3 results) No results for input(s): PROBNP in the last 8760 hours. HbA1C: No results for input(s): HGBA1C in the last 72 hours. CBG: No results for input(s): GLUCAP in the last 168 hours. Lipid Profile: No results for input(s): CHOL, HDL, LDLCALC, TRIG, CHOLHDL, LDLDIRECT in the last 72 hours. Thyroid Function Tests: No results for input(s): TSH, T4TOTAL, FREET4, T3FREE, THYROIDAB in the last 72 hours. Anemia Panel: Recent Labs    02/11/20 0311  VITAMINB12 634  FERRITIN 254  TIBC 191*  IRON 29  RETICCTPCT 1.2   Urine analysis:    Component Value Date/Time   COLORURINE AMBER (A) 02/03/2020 1812   APPEARANCEUR HAZY (A) 02/03/2020 1812   LABSPEC 1.020 02/03/2020 1812   PHURINE 5.0 02/03/2020 1812   GLUCOSEU NEGATIVE 02/03/2020 1812   HGBUR MODERATE (A) 02/03/2020 1812   BILIRUBINUR NEGATIVE 02/03/2020 1812   KETONESUR 5 (A) 02/03/2020 1812   PROTEINUR 100 (A) 02/03/2020 1812   NITRITE NEGATIVE 02/03/2020 1812   LEUKOCYTESUR TRACE (A) 02/03/2020 1812   Sepsis Labs: @LABRCNTIP (procalcitonin:4,lacticidven:4)  ) Recent Results (from the past 240 hour(s))  Resp Panel by RT PCR (RSV, Flu A&B, Covid) - Nasopharyngeal Swab     Status: None   Collection Time: 02/03/20  6:25 PM   Specimen: Nasopharyngeal Swab  Result Value Ref Range Status   SARS Coronavirus 2 by RT PCR NEGATIVE NEGATIVE Final    Comment: (NOTE) SARS-CoV-2 target nucleic acids are NOT DETECTED.  The SARS-CoV-2 RNA is generally detectable in upper respiratoy specimens during the acute phase of infection. The  lowest concentration of SARS-CoV-2 viral copies this assay can detect is 131 copies/mL. A negative result does not preclude SARS-Cov-2 infection and should not be used as the sole basis for treatment or other patient management decisions. A negative result may occur with  improper specimen collection/handling, submission of specimen other than nasopharyngeal swab, presence of viral mutation(s) within the areas targeted by this assay, and inadequate number of viral copies (<131 copies/mL). A negative result must be combined with clinical observations, patient history, and epidemiological information. The expected result is Negative.  Fact Sheet for Patients:  PinkCheek.be  Fact Sheet for Healthcare Providers:  GravelBags.it  This test is no  t yet approved or cleared by the [place] and  has been authorized for detection and/or diagnosis of SARS-CoV-2 by FDA under an Emergency Use Authorization (EUA). This EUA will remain  in effect (meaning this test can be used) for the duration of the COVID-19 declaration under Section 564(b)(1) of the Act, 21 U.S.C. section 360bbb-3(b)(1), unless the authorization is terminated or revoked sooner.     Influenza A by PCR NEGATIVE NEGATIVE Final   Influenza B by PCR NEGATIVE NEGATIVE Final    Comment: (NOTE) The Xpert Xpress SARS-CoV-2/FLU/RSV assay is intended as an aid in  the diagnosis of influenza from Nasopharyngeal swab specimens and  should not be used as a sole basis for treatment. Nasal washings and  aspirates are unacceptable for Xpert Xpress SARS-CoV-2/FLU/RSV  testing.  Fact Sheet for Patients: PinkCheek.be  Fact Sheet for Healthcare Providers: GravelBags.it  This test is not yet approved or cleared by the [place] FDA and  has been authorized for detection and/or diagnosis of SARS-CoV-2 by  FDA under  an Emergency Use Authorization (EUA). This EUA will remain  in effect (meaning this test can be used) for the duration of the  Covid-19 declaration under Section 564(b)(1) of the Act, 21  U.S.C. section 360bbb-3(b)(1), unless the authorization is  terminated or revoked.    Respiratory Syncytial Virus by PCR NEGATIVE NEGATIVE Final    Comment: (NOTE) Fact Sheet for Patients: PinkCheek.be  Fact Sheet for Healthcare Providers: GravelBags.it  This test is not yet approved or cleared by the [place] FDA and  has been authorized for detection and/or diagnosis of SARS-CoV-2 by  FDA under an Emergency Use Authorization (EUA). This EUA will remain  in effect (meaning this test can be used) for the duration of the  COVID-19 declaration under Section 564(b)(1) of the Act, 21 U.S.C.  section 360bbb-3(b)(1), unless the authorization is terminated or  revoked. Performed at [facility], [place] [address] [place], [place] [address]   Culture, blood (routine x 2) Call MD if unable to obtain prior to antibiotics being given     Status: Abnormal   Collection Time: 02/04/20  6:45 AM   Specimen: BLOOD  Result Value Ref Range Status   Specimen Description   Final    BLOOD LEFT ANTECUBITAL Performed at [facility] [address], [place], [place] [address]    Special Requests   Final    BOTTLES DRAWN AEROBIC AND ANAEROBIC Blood Culture adequate volume Performed at [facility] [address] [place], [place] [address]    Culture  Setup Time   Final    AEROBIC BOTTLE ONLY GRAM POSITIVE COCCI Organism ID to follow CRITICAL RESULT CALLED TO, READ BACK BY AND VERIFIED WITH: E JACKSON PHARMD 02/06/20 0210 JDW    Culture (A)  Final    STAPHYLOCOCCUS EPIDERMIDIS THE SIGNIFICANCE OF ISOLATING THIS ORGANISM FROM A SINGLE SET OF BLOOD CULTURES WHEN MULTIPLE SETS ARE DRAWN IS UNCERTAIN.  PLEASE NOTIFY THE MICROBIOLOGY DEPARTMENT WITHIN ONE WEEK IF SPECIATION AND SENSITIVITIES ARE REQUIRED. Performed at [facility], [place] [address] [place], [place] [address]    Report Status 02/06/2020 FINAL  Final  Blood Culture ID Panel (Reflexed)     Status: Abnormal   Collection Time: 02/04/20  6:45 AM  Result Value Ref Range Status   Enterococcus faecalis NOT DETECTED NOT DETECTED Final   Enterococcus Faecium NOT DETECTED NOT DETECTED Final   Listeria monocytogenes NOT DETECTED NOT  DETECTED Final   Staphylococcus species DETECTED (A) NOT DETECTED Final    Comment: CRITICAL RESULT CALLED TO, READ BACK BY AND VERIFIED WITH: E JACKSON PHARMD 02/06/20 0210 JDW    Staphylococcus aureus (BCID) NOT DETECTED NOT DETECTED Final   Staphylococcus epidermidis DETECTED (A) NOT DETECTED Final    Comment: Methicillin (oxacillin) resistant coagulase negative staphylococcus. Possible blood culture contaminant (unless isolated from more than one blood culture draw or clinical case suggests pathogenicity). No antibiotic treatment is indicated for blood  culture contaminants. CRITICAL RESULT CALLED TO, READ BACK BY AND VERIFIED WITH: E JACKSON PHARMD 02/06/20 0210 JDW    Staphylococcus lugdunensis NOT DETECTED NOT DETECTED Final   Streptococcus species NOT DETECTED NOT DETECTED Final   Streptococcus agalactiae NOT DETECTED NOT DETECTED Final   Streptococcus pneumoniae NOT DETECTED NOT DETECTED Final   Streptococcus pyogenes NOT DETECTED NOT DETECTED Final   A.calcoaceticus-baumannii NOT DETECTED NOT DETECTED Final   Bacteroides fragilis NOT DETECTED NOT DETECTED Final   Enterobacterales NOT DETECTED NOT DETECTED Final   Enterobacter cloacae complex NOT DETECTED NOT DETECTED Final   Escherichia coli NOT DETECTED NOT DETECTED Final   Klebsiella aerogenes NOT DETECTED NOT DETECTED Final   Klebsiella oxytoca NOT DETECTED NOT DETECTED Final   Klebsiella pneumoniae NOT DETECTED NOT DETECTED Final     Proteus species NOT DETECTED NOT DETECTED Final   Salmonella species NOT DETECTED NOT DETECTED Final   Serratia marcescens NOT DETECTED NOT DETECTED Final   Haemophilus influenzae NOT DETECTED NOT DETECTED Final   Neisseria meningitidis NOT DETECTED NOT DETECTED Final   Pseudomonas aeruginosa NOT DETECTED NOT DETECTED Final   Stenotrophomonas maltophilia NOT DETECTED NOT DETECTED Final   Candida albicans NOT DETECTED NOT DETECTED Final   Candida auris NOT DETECTED NOT DETECTED Final   Candida glabrata NOT DETECTED NOT DETECTED Final   Candida krusei NOT DETECTED NOT DETECTED Final   Candida parapsilosis NOT DETECTED NOT DETECTED Final   Candida tropicalis NOT DETECTED NOT DETECTED Final   Cryptococcus neoformans/gattii NOT DETECTED NOT DETECTED Final   Methicillin resistance mecA/C DETECTED (A) NOT DETECTED Final    Comment: CRITICAL RESULT CALLED TO, READ BACK BY AND VERIFIED WITHSeleta Rhymes [facility] 02/06/20 0210 JDW Performed at [facility], [address] [place], [place] [address]   Culture, blood (routine x 2) Call MD if unable to obtain prior to antibiotics being given     Status: None   Collection Time: 02/04/20  7:10 AM   Specimen: BLOOD RIGHT HAND  Result Value Ref Range Status   Specimen Description   Final    BLOOD RIGHT HAND Performed at [facility] [address], [place], [place] [address]    Special Requests   Final    BOTTLES DRAWN AEROBIC AND ANAEROBIC Blood Culture adequate volume Performed at [facility] [address], [place], [place] [address]    Culture   Final    NO GROWTH 5 DAYS Performed at [facility], [place] [address] [place], [place] [address]    Report Status 02/09/2020 FINAL  Final  MRSA PCR Screening     Status: None   Collection Time: 02/05/20  1:37 PM   Specimen: Nasal Mucosa; Nasopharyngeal  Result Value Ref Range Status   MRSA by PCR NEGATIVE NEGATIVE Final    Comment:        The  GeneXpert MRSA Assay (FDA approved for NASAL specimens only), is one component of a comprehensive MRSA colonization  surveillance program. It is not intended to diagnose MRSA infection nor to guide or monitor treatment for MRSA infections. Performed at [facility], [place] [address], [place],  [address]   SARS Coronavirus 2 by RT PCR (hospital order, performed in [facility] hospital lab) Nasopharyngeal Nasopharyngeal Swab     Status: None   Collection Time: 02/08/20  4:25 PM   Specimen: Nasopharyngeal Swab  Result Value Ref Range Status   SARS Coronavirus 2 NEGATIVE NEGATIVE Final    Comment: (NOTE) SARS-CoV-2 target nucleic acids are NOT DETECTED.  The SARS-CoV-2 RNA is generally detectable in upper and lower respiratory specimens during the acute phase of infection. The lowest concentration of SARS-CoV-2 viral copies this assay can detect is 250 copies / mL. A negative result does not preclude SARS-CoV-2 infection and should not be used as the sole basis for treatment or other patient management decisions.  A negative result may occur with improper specimen collection / handling, submission of specimen other than nasopharyngeal swab, presence of viral mutation(s) within the areas targeted by this assay, and inadequate number of viral copies (<250 copies / mL). A negative result must be combined with clinical observations, patient history, and epidemiological information.  Fact Sheet for Patients:   StrictlyIdeas.no  Fact Sheet for Healthcare Providers: BankingDealers.co.za  This test is not yet approved or  cleared by the [place] FDA and has been authorized for detection and/or diagnosis of SARS-CoV-2 by FDA under an Emergency Use Authorization (EUA).  This EUA will remain in effect (meaning this test can be used) for the duration of the COVID-19 declaration under Section 564(b)(1) of the Act, 21  U.S.C. section 360bbb-3(b)(1), unless the authorization is terminated or revoked sooner.  Performed at [facility], [place] [address], [place],  [address]       Studies: No results found.  Scheduled Meds: . enoxaparin (LOVENOX) injection  30 mg Subcutaneous Q24H  . escitalopram  5 mg Oral Daily  . lamoTRIgine  150 mg Oral BID  . lidocaine  2 patch Transdermal Q24H  . oxybutynin  5 mg Oral BID    Continuous Infusions: . sodium chloride    . sodium chloride 50 mL/hr at 02/13/20 1126     LOS: 10 days     Kayleen Memos, MD [facility] Pager [phone]  If 7PM-7AM, please contact night-coverage [email or web address] Password [email or web address] 02/13/2020, 4:01 PM

## 2020-02-14 DIAGNOSIS — J9601 Acute respiratory failure with hypoxia: Secondary | ICD-10-CM | POA: Diagnosis not present

## 2020-02-14 NOTE — Progress Notes (Signed)
PROGRESS NOTE  Kathleen Schneider FFM:[id number] DOB: 11/07/1940 DOA: 02/03/2020 PCP: Donald Prose, MD  HPI/Recap of past 24 hours: 79 y.o.femalewith medical history significant ofhypertension, recurrent UTI, chronic kidney disease stage III, excessive daytime sleepiness, gait abnormalities, visual changes, who came in from home with shortness of breath and weakness. Patient noticed progressive shortness of breath over the last 3 to 4 days. Associated with some cough. She felt very weak. No fever or chills. She has been weak that she has been mostly in bed. She came to the ER where she was evaluated. Patient was noted to be mildly hypoxic. She also requires about 2 L of oxygen now. She is COVID-19 negative but has evidence of pneumonia versus CHF. Her BNP is elevated but also has leukocytosis and infiltrates that could be technically speaking and pneumonia. She is got elevated creatinine which is slightly above her baseline. She is therefore being admitted to the hospital for acute respiratory failure probably pneumonia but could be CHF as well..  ED Course:Temperature 98.1 blood pressure 130/59 pulse108respiratory rate of 24 oxygen sats 93% on 2 L. White count is 18.2, hemoglobin 11.3, platelets 238 sodium 132 potassium 3.2 chloride 96 CO2 22 BUN 37 creatinine 2.05 and calcium 8.5. Glucose 139. COVID-19 screen is negative.Urinalysis showed many bacteria with WBC 21-50 but negative leukocytes and nitrite. Probably asymptomatic bacteriuria. Chest x-ray showed left basilar opacity consistent with atelectasis versus infiltrate. BNP is 382. Patient being admitted with pneumonia as well as suspected cardiac cause.  Post aspirate and core biopsy performed of bone marrow in the right iliac bone on 02/12/2020 by interventional radiology Dr. Kathlene Cote.  Medical oncology Dr. Alvy Bimler following.  02/14/20: No acute events overnight.  She reports poor oral intake and generalized weakness.   She is very concerned about her social situation, not being able to fully take care of her ADLs.  Assessment/Plan: Principal Problem:   Acute respiratory failure with hypoxia (HCC) Active Problems:   Spinal stenosis, lumbar   ARF (acute renal failure) (HCC)   Hyponatremia   Leucocytosis   Hypokalemia   Elevated brain natriuretic peptide (BNP) level   Sepsis ([place])   Metastatic cancer to bone [facility])   Advanced care planning/counseling discussion   Goals of care, counseling/discussion   Palliative care by specialist   Sepsis, resolved, present on admission due to CAP:  She presented tachypneic tachycardic and febrile at 103.1 with lactic acidosis and leukocytosis.  Chest x-ray with left basilar opacity consistent with atelectasis or infiltrates. She was treated with Rocephin and azithromycin, completed course. Follow-up blood cultures-staph epidermidis thought to be a contaminant. Influenza negative Covid negative. MRSA PCR negative.  Patient became hypoxic on 02/08/2020 requiring 6 L of oxygen.  She received multiple doses of IV Lasix.  Currently she is saturating 97% on 1 L.   Widespread sclerotic bony lesions: No definite primary localized on pan-CT scanning.  - BM biopsy/aspiration completed by IR on 02/12/2020, follow results. -Medical oncology Dr. Alvy Bimler following, appreciate assistance.  AKI on CKD 3B, likely prerenal in the setting of dehydration Baseline creatinine appears to be 1.01 with GFR of 35 Creatinine uptrending 1.76 Dry oral mucous membrane on exam Start IV fluid hydration normal saline at 50 cc/h x 2 days Encourage oral intake  Monitor urine output Avoid nephrotoxins Repeat renal panel  Hypovolemic hyponatremia Serum sodium 132 Normal saline at 50 cc/h Repeat BMP in the morning  Resolving acute hypoxic respiratory failure multifactorial secondary to community-acquired pneumonia and atelectasis Completed course of  empiric antibiotics for CAP, Rocephin  and azithromycin. Not on oxygen supplementation at baseline Currently on 1 L with O2 saturation 97%. Continue incentive spirometer Out of bed to chair, mobilize as tolerated.  Generalized weakness/physical debility Assessment PT OT with recommendation for SNF TOC assisting with disposition Continue PT OT with assistance and fall precautions.  Right > Left hydronephrosis:  -Urology consulted, no plan for surgical intervention at this time.  -Continue to monitor urine output Continue oxybutynin  Tree-in-bud opacities on CT scan:  - Pulmonary consulted. With no sputum for culture/AFB and improving respiratory symptoms, 3 month repeat CT with consideration for bronchoscopy at that time is recommended. No evidence of primary pulmonary malignancy.  Chronic diastolic CHF Echo 46/56/8127 with ejection fraction 65 to 70% with normal LV function no regional wall motion abnormalities.  Grade 1 diastolic dysfunction. IVC normal caliber and phasicity.  -BNP 557 on 02/04/2020 Monitor volume status while on gentle IV fluid. Continues with strict I's and O's and daily weight  Mildly elevated troponin likely secondary to demand ischemia Denies any anginal symptoms at the time of this visit Troponin S peaked at 61 on 02/04/2020 Continue to monitor on telemetry  Chronic depression anxiety Stable Continue home regimen  Obesity: Estimated body mass index is 36.25 kg/m as calculated from the following:   Height as of this encounter: 5' (1.524 m).   Weight as of this encounter: 84.2 kg.  DVT prophylaxis:  Subcu Lovenox daily Code Status:DNR Family Communication: None at bedside.  Consultants:   Oncology, Dr. Alvy Bimler  Pulmonology  Palliative Care  Urology  Procedures: None Antimicrobials:  Completed course of Rocephin and azithromycin    Status is: Inpatient   Dispo: The patient is from: Home.              Anticipated d/c is to: Home with home health services                Anticipated d/c date is: 02/16/20              Patient currently not stable for discharge due to ongoing work-up and management of suspected metastatic cancer.        Objective: Vitals:   02/13/20 1327 02/13/20 2100 02/14/20 0430 02/14/20 1618  BP: (!) 102/53 112/60 122/65 128/62  Pulse: 64 62 65 64  Resp: 16 18 16 14   Temp: 98.6 F (37 C) 98.4 F (36.9 C) 97.9 F (36.6 C) 97.8 F (36.6 C)  TempSrc: Oral Oral Oral Oral  SpO2: 97% 99% 96% 100%  Weight:      Height:        Intake/Output Summary (Last 24 hours) at 02/14/2020 1746 Last data filed at 02/14/2020 0900 Gross per 24 hour  Intake 968.15 ml  Output 800 ml  Net 168.15 ml   Filed Weights   02/04/20 1300  Weight: 84.2 kg    Exam: No significant changes from previous exam.  . General: 79 y.o. year-old female well-developed well-nourished no acute distress.  Alert and oriented x3.   . Cardiovascular: Regular rate and rhythm no rubs or gallops. Marland Kitchen Respiratory: Clear to auscultation no wheezes or rales. . Abdomen: Soft nontender normal bowel sounds present.  . Musculoskeletal: No lower extremity edema bilaterally. Marland Kitchen Psychiatry: Mood is appropriate for condition and setting.   Data Reviewed: CBC: Recent Labs  Lab 02/08/20 0418 02/12/20 0315 02/13/20 0342  WBC 10.6* 9.0 9.1  NEUTROABS  --  6.8  --   HGB 9.7* 9.0* 9.2*  HCT 30.8* 30.0* 30.3*  MCV 90.1 94.0 93.5  PLT 204 316 532   Basic Metabolic Panel: Recent Labs  Lab 02/08/20 0418 02/10/20 0506 02/12/20 0315 02/13/20 0342  NA 135  --  133* 132*  K 4.4  --  4.9 4.3  CL 97*  --  92* 93*  CO2 28  --  32 31  GLUCOSE 89  --  89 107*  BUN 23  --  16 20  CREATININE 1.62* 1.45* 1.50* 1.76*  CALCIUM 8.6*  --  8.6* 8.4*   GFR: Estimated Creatinine Clearance: 24.6 mL/min (A) (by C-G formula based on SCr of 1.76 mg/dL (H)). Liver Function Tests: Recent Labs  Lab 02/08/20 0418  AST 14*  ALT 17  ALKPHOS 111  BILITOT 0.5  PROT 6.3*  ALBUMIN 2.5*     No results for input(s): LIPASE, AMYLASE in the last 168 hours. No results for input(s): AMMONIA in the last 168 hours. Coagulation Profile: No results for input(s): INR, PROTIME in the last 168 hours. Cardiac Enzymes: No results for input(s): CKTOTAL, CKMB, CKMBINDEX, TROPONINI in the last 168 hours. BNP (last 3 results) No results for input(s): PROBNP in the last 8760 hours. HbA1C: No results for input(s): HGBA1C in the last 72 hours. CBG: No results for input(s): GLUCAP in the last 168 hours. Lipid Profile: No results for input(s): CHOL, HDL, LDLCALC, TRIG, CHOLHDL, LDLDIRECT in the last 72 hours. Thyroid Function Tests: No results for input(s): TSH, T4TOTAL, FREET4, T3FREE, THYROIDAB in the last 72 hours. Anemia Panel: No results for input(s): VITAMINB12, FOLATE, FERRITIN, TIBC, IRON, RETICCTPCT in the last 72 hours. Urine analysis:    Component Value Date/Time   COLORURINE AMBER (A) 02/03/2020 1812   APPEARANCEUR HAZY (A) 02/03/2020 1812   LABSPEC 1.020 02/03/2020 1812   PHURINE 5.0 02/03/2020 1812   GLUCOSEU NEGATIVE 02/03/2020 1812   HGBUR MODERATE (A) 02/03/2020 1812   BILIRUBINUR NEGATIVE 02/03/2020 1812   KETONESUR 5 (A) 02/03/2020 1812   PROTEINUR 100 (A) 02/03/2020 1812   NITRITE NEGATIVE 02/03/2020 1812   LEUKOCYTESUR TRACE (A) 02/03/2020 1812   Sepsis Labs: @LABRCNTIP (procalcitonin:4,lacticidven:4)  ) Recent Results (from the past 240 hour(s))  MRSA PCR Screening     Status: None   Collection Time: 02/05/20  1:37 PM   Specimen: Nasal Mucosa; Nasopharyngeal  Result Value Ref Range Status   MRSA by PCR NEGATIVE NEGATIVE Final    Comment:        The GeneXpert MRSA Assay (FDA approved for NASAL specimens only), is one component of a comprehensive MRSA colonization surveillance program. It is not intended to diagnose MRSA infection nor to guide or monitor treatment for MRSA infections. Performed at [facility], [place] [address] [place], [place] [address]   SARS Coronavirus 2 by RT PCR (hospital order, performed in [facility] hospital lab) Nasopharyngeal Nasopharyngeal Swab     Status: None   Collection Time: 02/08/20  4:25 PM   Specimen: Nasopharyngeal Swab  Result Value Ref Range Status   SARS Coronavirus 2 NEGATIVE NEGATIVE Final    Comment: (NOTE) SARS-CoV-2 target nucleic acids are NOT DETECTED.  The SARS-CoV-2 RNA is generally detectable in upper and lower respiratory specimens during the acute phase of infection. The lowest concentration of SARS-CoV-2 viral copies this assay can detect is 250 copies / mL. A negative result does not preclude SARS-CoV-2 infection and should not be used as the sole basis for treatment or other patient management decisions.  A negative result  may occur with improper specimen collection / handling, submission of specimen other than nasopharyngeal swab, presence of viral mutation(s) within the areas targeted by this assay, and inadequate number of viral copies (<250 copies / mL). A negative result must be combined with clinical observations, patient history, and epidemiological information.  Fact Sheet for Patients:   StrictlyIdeas.no  Fact Sheet for Healthcare Providers: BankingDealers.co.za  This test is not yet approved or  cleared by the [place] FDA and has been authorized for detection and/or diagnosis of SARS-CoV-2 by FDA under an Emergency Use Authorization (EUA).  This EUA will remain in effect (meaning this test can be used) for the duration of the COVID-19 declaration under Section 564(b)(1) of the Act, 21 U.S.C. section 360bbb-3(b)(1), unless the authorization is terminated or revoked sooner.  Performed at [facility], [place] [address], [place], [place] [address]       Studies: No results found.  Scheduled Meds: . enoxaparin (LOVENOX) injection  30 mg Subcutaneous Q24H  .  escitalopram  5 mg Oral Daily  . lamoTRIgine  150 mg Oral BID  . lidocaine  2 patch Transdermal Q24H  . oxybutynin  5 mg Oral BID    Continuous Infusions: . sodium chloride    . sodium chloride 50 mL/hr at 02/14/20 0200     LOS: 11 days     Kayleen Memos, MD [facility] Pager [phone]  If 7PM-7AM, please contact night-coverage [email or web address] Password [email or web address] 02/14/2020, 5:46 PM

## 2020-02-15 DIAGNOSIS — Z7189 Other specified counseling: Secondary | ICD-10-CM | POA: Diagnosis not present

## 2020-02-15 DIAGNOSIS — J189 Pneumonia, unspecified organism: Secondary | ICD-10-CM | POA: Diagnosis not present

## 2020-02-15 DIAGNOSIS — C7951 Secondary malignant neoplasm of bone: Secondary | ICD-10-CM | POA: Diagnosis not present

## 2020-02-15 DIAGNOSIS — J9601 Acute respiratory failure with hypoxia: Secondary | ICD-10-CM | POA: Diagnosis not present

## 2020-02-15 LAB — BASIC METABOLIC PANEL
Anion gap: 6 (ref 5–15)
BUN: 13 mg/dL (ref 8–23)
CO2: 31 mmol/L (ref 22–32)
Calcium: 8.5 mg/dL — ABNORMAL LOW (ref 8.9–10.3)
Chloride: 98 mmol/L (ref 98–111)
Creatinine, Ser: 1.44 mg/dL — ABNORMAL HIGH (ref 0.44–1.00)
GFR, Estimated: 37 mL/min — ABNORMAL LOW (ref >60)
Glucose, Bld: 131 mg/dL — ABNORMAL HIGH (ref 70–99)
Potassium: 4.4 mmol/L (ref 3.5–5.1)
Sodium: 135 mmol/L (ref 135–145)

## 2020-02-15 LAB — CBC
HCT: 28.8 % — ABNORMAL LOW (ref 36.0–46.0)
Hemoglobin: 8.7 g/dL — ABNORMAL LOW (ref 12.0–15.0)
MCH: 28.4 pg (ref 26.0–34.0)
MCHC: 30.2 g/dL (ref 30.0–36.0)
MCV: 94.1 fL (ref 80.0–100.0)
Platelets: 391 10*3/uL (ref 150–400)
RBC: 3.06 MIL/uL — ABNORMAL LOW (ref 3.87–5.11)
RDW: 14.9 % (ref 11.5–15.5)
WBC: 6.7 10*3/uL (ref 4.0–10.5)
nRBC: 0 % (ref 0.0–0.2)

## 2020-02-15 NOTE — Progress Notes (Signed)
Physical Therapy Treatment Patient Details Name: Kathleen Schneider MRN: [id number] DOB: 1941-01-03 Today's Date: 02/15/2020    History of Present Illness 79 yo female admitted with Pna, Sepsis after falling at home. Hx of CKD, gait abnormality, visual deficits, spinal stenosis    PT Comments    The patient is able to participate in mobility to recliner. Patient alert and follows commands. Patient does move slowly. Patient clearly unable to care for self at home withput 24/7 assistance.   Follow Up Recommendations  SNF     Equipment Recommendations  None recommended by PT    Recommendations for Other Services       Precautions / Restrictions Precautions Precautions: Fall Precaution Comments: monitor O2    Mobility  Bed Mobility   Bed Mobility: Supine to Sit     Supine to sit: Min assist;HOB elevated     General bed mobility comments: extra time, able to move legs to bed edge and used rail to push self upright.  Transfers Overall transfer level: Needs assistance Equipment used: Rolling walker (2 wheeled) Transfers: Sit to/from [facility] Sit to Stand: Mod assist Stand pivot transfers: Mod assist       General transfer comment: Mod steady assist to rise from bed. small shuffle steps to recliner.  Ambulation/Gait                 Stairs             Wheelchair Mobility    Modified Rankin (Stroke Patients Only)       Balance Overall balance assessment: Needs assistance Sitting-balance support: Bilateral upper extremity supported;Feet supported Sitting balance-[name]: Fair     Standing balance support: Bilateral upper extremity supported;During functional activity Standing balance-[name]: Poor                              Cognition Arousal/Alertness: Awake/alert Behavior During Therapy: WFL for tasks assessed/performed Overall Cognitive Status: No family/caregiver present to determine baseline  cognitive functioning Area of Impairment: Safety/judgement;Awareness;Memory                     Memory: Decreased short-term memory   Safety/Judgement: Decreased awareness of deficits Awareness: Emergent   General Comments: patioent oriented  to [facility], [place].      Exercises      General Comments        Pertinent Vitals/Pain Faces Pain Scale: No hurt    Home Living                      Prior Function            PT Goals (current goals can now be found in the care plan section) Progress towards PT goals: Progressing toward goals    Frequency    Min 2X/week      PT Plan Current plan remains appropriate;Frequency needs to be updated    Co-evaluation              AM-PAC PT "6 Clicks" Mobility   Outcome Measure  Help needed turning from your back to your side while in a flat bed without using bedrails?: A Little Help needed moving from lying on your back to sitting on the side of a flat bed without using bedrails?: A Little Help needed moving to and from a bed to a chair (including a wheelchair)?: A Lot Help needed standing up from  a chair using your arms (e.g., wheelchair or bedside chair)?: A Lot Help needed to walk in hospital room?: A Lot Help needed climbing 3-5 steps with a railing? : Total 6 Click Score: 13    End of Session Equipment Utilized During Treatment: Gait belt Activity Tolerance: Patient tolerated treatment well Patient left: in bed;with call bell/phone within reach;with chair alarm set Nurse Communication: Mobility status PT Visit Diagnosis: Muscle weakness (generalized) (M62.81);History of falling (Z91.81);Unsteadiness on feet (R26.81)     Time: 2956-2130 PT Time Calculation (min) (ACUTE ONLY): 29 min  Charges:  $Therapeutic Activity: 23-37 mins                     [name] PT Acute Rehabilitation Services Pager [phone] Office [phone]    [name] 02/15/2020, 1:18 PM

## 2020-02-15 NOTE — Care Management Important Message (Signed)
Important Message  Patient Details IM Letter given to the Patient Name: Kathleen Schneider MRN: [id number] Date of Birth: 1940/05/05   Medicare Important Message Given:  Yes     Kerin Salen 02/15/2020, 9:54 AM

## 2020-02-15 NOTE — Progress Notes (Signed)
PROGRESS NOTE  Kathleen Schneider ZOX:[id number] DOB: 01-22-1941 DOA: 02/03/2020 PCP: Donald Prose, MD  HPI/Recap of past 24 hours: 79 y.o.femalewith medical history significant ofhypertension, recurrent UTI, chronic kidney disease stage III, excessive daytime sleepiness, gait abnormalities, visual changes, who came in from home with progressive shortness of breath and weakness of 4 days duration.  Has been mostly in bed. She came to the ER and was noted to be mildly hypoxic. She also requires 2 L of oxygen [place] to maintain her sats. COVID-19 negative but with evidence of pneumonia versus CHF. Her BNP was elevated but also had leukocytosis and infiltrates with concern for CAP. Admitted by [facility], hospitalist team.  Treated for CAP, AKI.  On 02/07/20 she had imaging study of her hip which showed sclerotic bone lesion.  She underwent bone scan on 02/10/2020 which showed diffuse metastatic lesion in her bone.  She has denied bone pain.  She has denied a family history of cancer.  She had aspirate and core biopsy performed of bone marrow in the right iliac bone on 02/12/2020 by IR Dr. Kathlene Cote.  Oncology Dr. Alvy Bimler is following.  Patient cannot reliably complete work-up outpatient and therefore was kept inpatient, awaiting biopsy results.  Palliative care team also following and assisting with establishing goals of care.  02/15/20: Seen and examined at her bedside.  States she feels depressed due to social stressors.  She is currently on Lexapro.  Psych consulted to assist.   Assessment/Plan: Principal Problem:   Acute respiratory failure with hypoxia ([place]) Active Problems:   Spinal stenosis, lumbar   ARF (acute renal failure) (HCC)   Hyponatremia   Leucocytosis   Hypokalemia   Elevated brain natriuretic peptide (BNP) level   Sepsis (HCC)   Metastatic cancer to bone [facility])   Advanced care planning/counseling discussion   Goals of care, counseling/discussion   Palliative care by specialist    Community acquired pneumonia of left lower lobe of lung   Sepsis, resolved, present on admission due to CAP:  She presented tachypneic tachycardic and febrile at 103.1 with lactic acidosis and leukocytosis.  Chest x-ray with left basilar opacity consistent with atelectasis or infiltrates. She was treated with Rocephin and azithromycin, completed course. Follow-up blood cultures-staph epidermidis thought to be a contaminant. Influenza negative, Covid-19 screening negative. MRSA PCR negative.  Patient became hypoxic on 02/08/2020 requiring 6 L of oxygen.  She received multiple doses of IV Lasix.  Currently she is saturating 97% on 1 L.  Afebrile with no leukocytosis, nontoxic-appearing.  Widespread sclerotic bony lesions: No definite primary localized on pan-CT scanning.   She underwent bone scan on 02/10/2020 which showed diffuse metastatic lesion in her bone.  - BM biopsy/aspiration completed by IR on 02/12/2020, follow results. -Medical oncology Dr. Alvy Bimler following, appreciate assistance.  Uncontrolled depression likely secondary to ongoing social stressors Currently on Lexapro Psych consulted to assist on 02/15/20  AKI on CKD 3B, likely prerenal in the setting of dehydration Baseline creatinine appears to be 1.01 with GFR of 35 Creatinine 11.4 from 1.76 with IV fluid Dry oral mucous membrane on exam C/w IV fluid hydration normal saline at 50 cc/h x 3 days Encourage oral intake  Monitor urine output Avoid nephrotoxins Repeat renal panel  Resolved Hypovolemic hyponatremia post IV fluid NS Serum sodium 132>> 135 C/w Normal saline at 50 cc/h  Resolved acute hypoxic respiratory failure multifactorial secondary to community-acquired pneumonia and atelectasis Completed course of empiric antibiotics for CAP, Rocephin and azithromycin. Not on oxygen supplementation at  baseline Currently on 1 L with O2 saturation 97%. Continue incentive spirometer Out of bed to chair, mobilize as  tolerated.  Generalized weakness/physical debility Assessment PT OT with recommendation for SNF TOC assisting with disposition to home with home health services Continue PT OT with assistance and fall precautions.  Right > Left hydronephrosis:  -Urology consulted, no plan for surgical intervention at this time.  -Continue to monitor urine output Continue oxybutynin  Tree-in-bud opacities on CT scan:  - Pulmonary consulted. With no sputum for culture/AFB and improving respiratory symptoms, 3 month repeat CT with consideration for bronchoscopy at that time is recommended. No evidence of primary pulmonary malignancy.  Chronic diastolic CHF Echo 99/24/2683 with ejection fraction 65 to 70% with normal LV function no regional wall motion abnormalities.  Grade 1 diastolic dysfunction. IVC normal caliber and phasicity.  -BNP 557 on 02/04/2020 Monitor volume status while on gentle IV fluid. Continues with strict I's and O's and daily weight  Mildly elevated troponin likely secondary to demand ischemia Denies any anginal symptoms at the time of this visit Troponin S peaked at 61 on 02/04/2020 Continue to monitor on telemetry  Chronic depression anxiety Stable Continue home regimen  Obesity: Estimated body mass index is 36.25 kg/m as calculated from the following:   Height as of this encounter: 5' (1.524 m).   Weight as of this encounter: 84.2 kg.  DVT prophylaxis:  Subcu Lovenox daily Code Status:DNR Family Communication: None at bedside.  Consultants:   Oncology, Dr. Alvy Bimler  Pulmonology  Palliative Care  Urology  Procedures: None Antimicrobials:  Completed course of Rocephin and azithromycin    Status is: Inpatient   Dispo: The patient is from: Home.              Anticipated d/c is to: Home with home health services               Anticipated d/c date is: 02/18/20              Patient currently not stable for discharge due to ongoing work-up and management of  suspected metastatic cancer.        Objective: Vitals:   02/14/20 1618 02/14/20 2139 02/15/20 0341 02/15/20 1338  BP: 128/62 (!) 115/57 (!) 121/54 112/67  Pulse: 64 62 69 67  Resp: 14 18 (!) 21 18  Temp: 97.8 F (36.6 C) 97.9 F (36.6 C) 97.8 F (36.6 C) 97.6 F (36.4 C)  TempSrc: Oral Oral Oral Oral  SpO2: 100% 97% 100% 99%  Weight:      Height:        Intake/Output Summary (Last 24 hours) at 02/15/2020 1514 Last data filed at 02/15/2020 0901 Gross per 24 hour  Intake 1250 ml  Output 975 ml  Net 275 ml   Filed Weights   02/04/20 1300  Weight: 84.2 kg    Exam:   . General: 79 y.o. year-old female Pleasant WD WN NAD A&O x 3.  Flat affect. . Cardiovascular: RRR no rubs or gallops . Respiratory: CTA no wheezes or rales . Abdomen: Soft NT NBS x4 . Musculoskeletal: No LE edema . Psychiatry: Flat affect.   Data Reviewed: CBC: Recent Labs  Lab 02/12/20 0315 02/13/20 0342 02/15/20 0331  WBC 9.0 9.1 6.7  NEUTROABS 6.8  --   --   HGB 9.0* 9.2* 8.7*  HCT 30.0* 30.3* 28.8*  MCV 94.0 93.5 94.1  PLT 316 345 419   Basic Metabolic Panel: Recent Labs  Lab 02/10/20 0506 02/12/20  7564 02/13/20 0342 02/15/20 0331  NA  --  133* 132* 135  K  --  4.9 4.3 4.4  CL  --  92* 93* 98  CO2  --  32 31 31  GLUCOSE  --  89 107* 131*  BUN  --  16 20 13   CREATININE 1.45* 1.50* 1.76* 1.44*  CALCIUM  --  8.6* 8.4* 8.5*   GFR: Estimated Creatinine Clearance: 30 mL/min (A) (by C-G formula based on SCr of 1.44 mg/dL (H)). Liver Function Tests: No results for input(s): AST, ALT, ALKPHOS, BILITOT, PROT, ALBUMIN in the last 168 hours. No results for input(s): LIPASE, AMYLASE in the last 168 hours. No results for input(s): AMMONIA in the last 168 hours. Coagulation Profile: No results for input(s): INR, PROTIME in the last 168 hours. Cardiac Enzymes: No results for input(s): CKTOTAL, CKMB, CKMBINDEX, TROPONINI in the last 168 hours. BNP (last 3 results) No results for  input(s): PROBNP in the last 8760 hours. HbA1C: No results for input(s): HGBA1C in the last 72 hours. CBG: No results for input(s): GLUCAP in the last 168 hours. Lipid Profile: No results for input(s): CHOL, HDL, LDLCALC, TRIG, CHOLHDL, LDLDIRECT in the last 72 hours. Thyroid Function Tests: No results for input(s): TSH, T4TOTAL, FREET4, T3FREE, THYROIDAB in the last 72 hours. Anemia Panel: No results for input(s): VITAMINB12, FOLATE, FERRITIN, TIBC, IRON, RETICCTPCT in the last 72 hours. Urine analysis:    Component Value Date/Time   COLORURINE AMBER (A) 02/03/2020 1812   APPEARANCEUR HAZY (A) 02/03/2020 1812   LABSPEC 1.020 02/03/2020 1812   PHURINE 5.0 02/03/2020 1812   GLUCOSEU NEGATIVE 02/03/2020 1812   HGBUR MODERATE (A) 02/03/2020 1812   BILIRUBINUR NEGATIVE 02/03/2020 1812   KETONESUR 5 (A) 02/03/2020 1812   PROTEINUR 100 (A) 02/03/2020 1812   NITRITE NEGATIVE 02/03/2020 1812   LEUKOCYTESUR TRACE (A) 02/03/2020 1812   Sepsis Labs: @LABRCNTIP (procalcitonin:4,lacticidven:4)  ) Recent Results (from the past 240 hour(s))  SARS Coronavirus 2 by RT PCR (hospital order, performed in [facility] hospital lab) Nasopharyngeal Nasopharyngeal Swab     Status: None   Collection Time: 02/08/20  4:25 PM   Specimen: Nasopharyngeal Swab  Result Value Ref Range Status   SARS Coronavirus 2 NEGATIVE NEGATIVE Final    Comment: (NOTE) SARS-CoV-2 target nucleic acids are NOT DETECTED.  The SARS-CoV-2 RNA is generally detectable in upper and lower respiratory specimens during the acute phase of infection. The lowest concentration of SARS-CoV-2 viral copies this assay can detect is 250 copies / mL. A negative result does not preclude SARS-CoV-2 infection and should not be used as the sole basis for treatment or other patient management decisions.  A negative result may occur with improper specimen collection / handling, submission of specimen other than nasopharyngeal swab, presence of  viral mutation(s) within the areas targeted by this assay, and inadequate number of viral copies (<250 copies / mL). A negative result must be combined with clinical observations, patient history, and epidemiological information.  Fact Sheet for Patients:   StrictlyIdeas.no  Fact Sheet for Healthcare Providers: BankingDealers.co.za  This test is not yet approved or  cleared by the [place] FDA and has been authorized for detection and/or diagnosis of SARS-CoV-2 by FDA under an Emergency Use Authorization (EUA).  This EUA will remain in effect (meaning this test can be used) for the duration of the COVID-19 declaration under Section 564(b)(1) of the Act, 21 U.S.C. section 360bbb-3(b)(1), unless the authorization is terminated or revoked sooner.  Performed  at [facility], [place] [address] [place], [place] [address]       Studies: No results found.  Scheduled Meds: . enoxaparin (LOVENOX) injection  30 mg Subcutaneous Q24H  . escitalopram  5 mg Oral Daily  . lamoTRIgine  150 mg Oral BID  . lidocaine  2 patch Transdermal Q24H  . oxybutynin  5 mg Oral BID    Continuous Infusions: . sodium chloride       LOS: 12 days     Kayleen Memos, MD [facility] Pager [phone]  If 7PM-7AM, please contact night-coverage [email or web address] Password [facility] 02/15/2020, 3:14 PM

## 2020-02-15 NOTE — Progress Notes (Signed)
Daily Progress Note   Patient Name: Kathleen Schneider       Date: 02/15/2020 DOB: December 09, 1940  Age: 79 y.o. MRN#: [id number] Attending Physician: Kayleen Memos, DO Primary Care Physician: Donald Prose, MD Admit Date: 02/03/2020  Reason for Consultation/Follow-up: Establishing goals of care  Subjective: Patient in bed, tearful regarding social situation. She does note that if her final diagnosis is cancer she would prefer comfort measures and to allow cancer to take its natural course. She is very unhappy with her current quality of life.   ROS  Length of Stay: 12  Current Medications: Scheduled Meds:  . enoxaparin (LOVENOX) injection  30 mg Subcutaneous Q24H  . escitalopram  5 mg Oral Daily  . lamoTRIgine  150 mg Oral BID  . lidocaine  2 patch Transdermal Q24H  . oxybutynin  5 mg Oral BID    Continuous Infusions: . sodium chloride    . sodium chloride Stopped (02/15/20 0658)    PRN Meds: sodium chloride, acetaminophen, ipratropium-albuterol, ondansetron (ZOFRAN) IV  Physical Exam Vitals and nursing note reviewed.  Pulmonary:     Effort: Pulmonary effort is normal.  Neurological:     Mental Status: She is alert.             Vital Signs: BP (!) 121/54 (BP Location: Right Arm)   Pulse 69   Temp 97.8 F (36.6 C) (Oral)   Resp (!) 21   Ht 5' (1.524 m)   Wt 84.2 kg   SpO2 100%   BMI 36.25 kg/m  SpO2: SpO2: 100 % O2 Device: O2 Device: Room Air O2 Flow Rate: O2 Flow Rate (L/min): 1 L/min  Intake/output summary:   Intake/Output Summary (Last 24 hours) at 02/15/2020 1058 Last data filed at 02/15/2020 0901 Gross per 24 hour  Intake 1490 ml  Output 975 ml  Net 515 ml   LBM: Last BM Date: 02/14/20 Baseline Weight: Weight: 84.2 kg Most recent weight: Weight: 84.2  kg       Palliative Assessment/Data: PPS: 30%      Patient Active Problem List   Diagnosis Date Noted  . Metastatic cancer to bone ([place])   . Advanced care planning/counseling discussion   . Goals of care, counseling/discussion   . Palliative care by specialist   . Hyponatremia 02/03/2020  . Leucocytosis 02/03/2020  .  Hypokalemia 02/03/2020  . Acute respiratory failure with hypoxia ([place]) 02/03/2020  . Elevated brain natriuretic peptide (BNP) level 02/03/2020  . Sepsis ([place]) 02/03/2020  . ARF (acute renal failure) ([place]) 12/09/2019  . Fall at home, initial encounter 12/09/2019  . Acute lower UTI 12/09/2019  . Rhabdomyolysis 12/09/2019  . Dehydration 12/09/2019  . Advanced age 76/25/2021  . Bilateral knee pain 12/09/2019  . Facet joint syndrome 05/13/2017  . Spinal stenosis, lumbar 09/25/2016  . Lower back pain 03/27/2016  . Excessive daytime sleepiness 05/24/2015  . Postherpetic neuralgia 02/18/2015  . Dysesthesia 02/18/2015  . Insomnia 02/18/2015  . Snoring 02/18/2015    Palliative Care Assessment & Plan   Patient Profile: 79 y.o. female  with past medical history of CKD, spinal stenosis, HTN, admission in August of this year for acute kidney injury in the setting of UTI and traumatic rhabdomyolysis from when she was discharged to nursing facility and successfully rehabbed and was able to discharge home.  Now admitted on 02/03/2020 after a fall and weakness at home.  She was admitted and treated for sepsis pneumonia and possibly CHF exacerbation.  She had an echo that showed 65 to 70% ejection fraction and Grade 1 diastolic dysfunction. Admission and recovery have been complicated by incidental findings on pelvic xray of sclerotic bone lesions. Follow up nuclear bone scan shows diffuse uptake in multiple bones consistent with bony metastasis. Workup thus far does not reveal primary. Oncology has been consulted. Bone marrow biopsy planned for tomorrow (10/29). She was originally  planned to discharge to SNF however insurance has declined SNF admission due to they feel she is custodial and not rehabable. Palliative medicine consulted for goals of care.    Assessment/Recommendations/Plan  Await bone marrow biopsy results - will f/u with patient after results Appreciate TOC working to ID resources for patient home care Recommend outpatient Palliative f/u for now- will re-eval goals of care once bone marrow biopsy results  Goals of Care and Additional Recommendations: Limitations on Scope of Treatment: Full Scope Treatment  Code Status: DNR  Prognosis:  Unable to determine  Discharge Planning: To Be Determined  Care plan was discussed with patient and Education officer, museum.   Thank you for allowing the Palliative Medicine Team to assist in the care of this patient.   Total time: 42 mins  Greater than 50%  of this time was spent counseling and coordinating care related to the above assessment and plan.  Mariana Kaufman, AGNP-C Palliative Medicine   Please contact Palliative Medicine Team phone at [phone] for questions and concerns.

## 2020-02-16 DIAGNOSIS — J9601 Acute respiratory failure with hypoxia: Secondary | ICD-10-CM | POA: Diagnosis not present

## 2020-02-16 MED ORDER — SODIUM CHLORIDE 0.9 % IV SOLN: INTRAVENOUS | Status: DC

## 2020-02-16 MED ORDER — ESCITALOPRAM OXALATE 10 MG PO TABS
5.0000 mg | ORAL_TABLET | Freq: Once | ORAL | Status: AC
  Administered 2020-02-16: 5 mg via ORAL
  Filled 2020-02-16: qty 1

## 2020-02-16 MED ORDER — ESCITALOPRAM OXALATE 10 MG PO TABS
10.0000 mg | ORAL_TABLET | Freq: Every day | ORAL | Status: DC
  Administered 2020-02-17 – 2020-02-18 (×2): 10 mg via ORAL
  Filled 2020-02-16 (×2): qty 1

## 2020-02-16 NOTE — Progress Notes (Signed)
Occupational Therapy Treatment Patient Details Name: Kathleen Schneider MRN: [id number] DOB: [birth year] Today's Date: 02/16/2020    History of present illness 79 yo female admitted with Pna, Sepsis after falling at home. Hx of CKD, gait abnormality, visual deficits, spinal stenosis. S/p RT iliac bone biopsy on 02/12/20 and results consistent with metastatic breast carcinoma.   OT comments  Patient progressing and showed improved ability to stand and pivot with RW as opposed to need of Kathleen Schneider, compared to previous session. Patient limited by generalized weakness including postural weakness, decreased safety awareness/areness of impairments, poor standing balance, and poor activity tolerance, along with deficits noted below. Pt continues to demonstrate good rehab potential and would benefit from continued skilled OT to increase safety and independence with ADLs and functional transfers to allow pt to return home safely and reduce caregiver burden and fall risk.   Follow Up Recommendations  SNF (Per chart pt may not qualify due to past AMA discharge from SNF, but very much needs some form of rehab and cannot go home alone.)    Equipment Recommendations  3 in 1 bedside commode    Recommendations for Other Services      Precautions / Restrictions Precautions Precautions: Fall Precaution Comments: monitor O2 Restrictions Weight Bearing Restrictions: No       Mobility Bed Mobility Overal bed mobility: Needs Assistance Bed Mobility: Supine to Sit     Supine to sit: Min assist;HOB elevated     General bed mobility comments: Increased time, able to move legs to bed edge and used rail to push self upright.  Transfers Overall transfer level: Needs assistance   Transfers: Sit to/from Stand;Stand Pivot Transfers Sit to Stand: Mod assist Stand pivot transfers: Mod assist       General transfer comment: Frequent verbal cues for sequencing steps and negotiating walker.    Balance  Overall balance assessment: Needs assistance Sitting-balance support: Bilateral upper extremity supported;Feet supported Sitting balance-[name]: Fair     Standing balance support: Bilateral upper extremity supported;During functional activity Standing balance-[name]: Poor                             ADL either performed or assessed with clinical judgement   ADL Overall ADL's : Needs assistance/impaired     Grooming: Wash/dry face;Wash/dry hands;Oral care;Sitting;Supervision/safety Grooming Details (indicate cue type and reason): Pt cued to sit with back unsupported in chair in order to engage core mm, and completed grooming tasks with feet supported on floor with setup and supervision.                   Toilet Transfer Details (indicate cue type and reason): Please refer to Mobility section.   Toileting - Clothing Manipulation Details (indicate cue type and reason): Pt using pure wick which was changed with fresh aplied per pt request.     Functional mobility during ADLs: Moderate assistance;Rolling walker       Vision       Perception     Praxis      Cognition Arousal/Alertness: Awake/alert Behavior During Therapy: WFL for tasks assessed/performed Overall Cognitive Status: No family/caregiver present to determine baseline cognitive functioning Area of Impairment: Safety/judgement;Awareness;Memory                     Memory: Decreased short-term memory   Safety/Judgement: Decreased awareness of deficits     General Comments: patioent oriented  to Kathleen Schneider,  month and year. Vague with situation. Impulsive to stand, needs cues for assistance, to wait for gait belt and walker.        Exercises     Shoulder Instructions       General Comments      Pertinent Vitals/ Pain       Pain Assessment: No/denies pain Faces Pain Scale: No hurt  Home Living                                          Prior  Functioning/Environment              Frequency  Min 2X/week        Progress Toward Goals  OT Goals(current goals can now be found in the care plan section)  Progress towards OT goals: Progressing toward goals  Acute Rehab OT Goals Patient Stated Goal: to regain independence and PLOF OT Goal Formulation: With patient Time For Goal Achievement: 02/20/20 Potential to Achieve Goals: Good  Plan Discharge plan remains appropriate    Co-evaluation                 AM-PAC OT "6 Clicks" Daily Activity     Outcome Measure   Help from another person eating meals?: A Little Help from another person taking care of personal grooming?: A Little Help from another person toileting, which includes using toliet, bedpan, or urinal?: A Lot Help from another person bathing (including washing, rinsing, drying)?: A Lot Help from another person to put on and taking off regular upper body clothing?: A Little Help from another person to put on and taking off regular lower body clothing?: A Lot 6 Click Score: 15    End of Session Equipment Utilized During Treatment: Gait belt;Rolling walker;Oxygen  OT Visit Diagnosis: Unsteadiness on feet (R26.81);Muscle weakness (generalized) (M62.81);History of falling (Z91.81)   Activity Tolerance Patient limited by fatigue   Patient Left with call bell/phone within reach;in chair;with chair alarm set   Nurse Communication Mobility status        Time: 3500-9381 OT Time Calculation (min): 44 min  Charges: OT General Charges $OT Visit: 1 Visit OT Treatments $Self Care/Home Management : 23-37 mins $Therapeutic Activity: 8-22 mins  Kathleen Schneider, Kathleen Schneider Office: [phone] 02/16/2020    Kathleen Schneider 02/16/2020, 12:26 PM

## 2020-02-16 NOTE — Consult Note (Signed)
79 year old female who presented with progressive shortness of breath and weakness x4 days.  Psychiatric consult was placed for uncontrolled depression on Lexapro.  As per patient she was previously diagnosed with depression by Dr. Nancy Fetter, and she was taking Lexapro 5 mg p.o. every other day.  Prior to admission she contacted Dr. Nancy Fetter to notify her of her worsening depressive symptoms, who advised her to increase to daily.  She reports when taking Lexapro daily it does help with her symptoms.  She states her stressors are mainly due to her current medical conditions to include recent diagnosis of metastatic carcinoma, needing assistance to live at home, and ongoing need for palliative care.  She appears to be in high spirits, however endorses some depression.  She identifies symptoms of crying daily, decreased appetite, hypersomnia, and hopelessness.  She denies any current active or passive suicidal ideations, she is also able to contract for safety.  She does report a history of " many many many many years ago I attempted to cut my wrists.  I did tell my therapist at the time.  I have not tried anything since then.  It was a very long time ago.  "  Patient is open to outpatient therapy, and is under the care of a current psychiatrist.  She is agreeable to increasing her Lexapro to 10 mg p.o. daily.  Last EKG obtained on October 20, QTC of 465. -We will increase Lexapro to 10 mg p.o. daily -We will recommend social work to facilitate referral for outpatient therapy. -Patient does not meet inpatient psychiatric criteria -Psychiatric to sign off at this time.

## 2020-02-16 NOTE — Progress Notes (Addendum)
HEMATOLOGY-ONCOLOGY PROGRESS NOTE  Patient Care Team: Donald Prose, MD as PCP - General (Family Medicine)   ASSESSMENT & PLAN:    I have seen the patient and agree with documentation as follows  Diffuse sclerotic bone lesion, due to stage IV metastatic breast cancer to the bone I could not find any localizing signs to suggest her primary malignancy CT chest/abdomen/pelvis reviewed which again shows innumerable sclerotic lesions consistent with metastatic disease, but no definitive primary site Noted to have prominent lymph nodes in the left axillary and subpectoral regions, but not enlarged by CT criteria and bronchial wall thickening more likely related to infection/inflammation There are some small hypoattenuating lesions in the liver which are too small to characterize CT-guided bone marrow aspirate and biopsy were obtained on 02/12/2020 and results consistent with metastatic breast carcinoma Biopsy results of been discussed with the patient She has an incurable malignancy, however it is treatable Due to her poor performance status, she is not a candidate for systemic chemotherapy but we can consider her for antiestrogen therapy such as tamoxifen or an aromatase inhibitor We discussed that we will see her in our office when she is discharged to discuss treatment options Starting her on antiestrogen therapy while hospitalized does not affect prognosis or improve her outcome She can benefit from bisphosphonate therapy but would require dental clearance which we are not able to obtain while she is hospitalized I can set that up in the outpatient setting  Anemia Multifactorial, likely due to recent infection and anemia chronic kidney disease.  Bone marrow infiltration from malignancy can also cause her anemia She has no evidence of iron deficiency anemia or B12 deficiency Observe only for now She does not need transfusion support   Respiratory failure secondary to pneumonia O2  requirements slowly decreasing Has completed a course of antibiotics  Left-sided hydronephrosis Etiology unclear Renal function stable The patient was seen by urology and no intervention was recommended Continue to trend renal function  Discharge planning Work-up for her cancer is now complete I spoke with OT and social work and note that the recommendation has been made for SNF, however, the patient's insurance has declined SNF placement due to recent 30-day stay in a SNF and no additional benefit was thought to be derived from an additional stay Current plan is for home with home health -social work is trying to connect her with as many resources as possible The patient also states that she is planning to hire an aide to help her I will sign off. When she is discharged, I will set her outpatient follow-up   All questions were answered. The patient knows to call the clinic with any problems, questions or concerns. No barriers to learning was detected.  Mikey Bussing, DNP, AGPCNP-BC, AOCNP Heath Lark, MD  INTERVAL HISTORY: Denies chest pain Shortness of breath improved Denies abdominal pain, nausea, vomiting Denies bone pain  REVIEW OF SYSTEMS:   Constitutional: Denies fevers, chills Eyes: Denies blurriness of vision Ears, nose, mouth, throat, and face: Denies mucositis or sore throat Respiratory: Denies cough and shortness of breath  Cardiovascular: Denies palpitation, chest discomfort Gastrointestinal: Denies nausea and vomiting  Skin: Denies abnormal skin rashes Lymphatics: Denies new lymphadenopathy or easy bruising Neurological:Denies numbness, tingling or new weaknesses Behavioral/Psych: Mood is stable, no new changes  Extremities: No lower extremity edema All other systems were reviewed with the patient and are negative.  I have reviewed the past medical history, past surgical history, social history and family history with the  patient and they are unchanged from  previous note.   PHYSICAL EXAMINATION: ECOG PERFORMANCE STATUS: 4 - Bedbound  Vitals:   02/15/20 1936 02/16/20 0405  BP: 130/68 126/63  Pulse: 66 66  Resp: 15 17  Temp: 97.8 F (36.6 C) 99 F (37.2 C)  SpO2: 100% 98%   Filed Weights   02/04/20 1300  Weight: 84.2 kg    Intake/Output from previous day: 11/01 0701 - 11/02 0700 In: 290 [P.O.:290] Out: 550 [Urine:550]  GENERAL: Elderly female who appears frail, no distress SKIN: skin color, texture, turgor are normal, no rashes or significant lesions EYES: normal, Conjunctiva are pink and non-injected, sclera clear OROPHARYNX:no exudate, no erythema and lips, buccal mucosa, and tongue normal  NECK: supple, thyroid normal size, non-tender, without nodularity LYMPH:  no palpable lymphadenopathy in the cervical, axillary or inguinal LUNGS: clear to auscultation and percussion with normal breathing effort HEART: regular rate & rhythm and no murmurs and no lower extremity edema ABDOMEN:abdomen soft, non-tender and normal bowel sounds NEURO: alert & oriented x 3 with fluent speech, no focal motor/sensory deficits  LABORATORY DATA:  I have reviewed the data as listed CMP Latest Ref Rng & Units 02/15/2020 02/13/2020 02/12/2020  Glucose 70 - 99 mg/dL 131(H) 107(H) 89  BUN 8 - 23 mg/dL 13 20 16   Creatinine 0.44 - 1.00 mg/dL 1.44(H) 1.76(H) 1.50(H)  Sodium 135 - 145 mmol/L 135 132(L) 133(L)  Potassium 3.5 - 5.1 mmol/L 4.4 4.3 4.9  Chloride 98 - 111 mmol/L 98 93(L) 92(L)  CO2 22 - 32 mmol/L 31 31 32  Calcium 8.9 - 10.3 mg/dL 8.5(L) 8.4(L) 8.6(L)  Total Protein 6.5 - 8.1 g/dL - - -  Total Bilirubin 0.3 - 1.2 mg/dL - - -  Alkaline Phos 38 - 126 U/L - - -  AST 15 - 41 U/L - - -  ALT 0 - 44 U/L - - -    Lab Results  Component Value Date   WBC 6.7 02/15/2020   HGB 8.7 (L) 02/15/2020   HCT 28.8 (L) 02/15/2020   MCV 94.1 02/15/2020   PLT 391 02/15/2020   NEUTROABS 6.8 02/12/2020   I have personally reviewed her CT imaging NM  Bone Scan Whole Body  Result Date: 02/09/2020 CLINICAL DATA:  Metastatic disease evaluation, sclerotic foci on pelvic radiographs several falls at home, BILATERAL leg pain, history hypertension EXAM: NUCLEAR MEDICINE WHOLE BODY BONE SCAN TECHNIQUE: Whole body anterior and posterior images were obtained approximately 3 hours after intravenous injection of radiopharmaceutical. RADIOPHARMACEUTICALS:  21.2 mCi Technetium-18mMDP IV COMPARISON:  None Radiographic correlation: LEFT hip and pelvic radiographs 02/07/2020 FINDINGS: Multiple foci of abnormal osseous tracer accumulation consistent with osseous metastases. These include calvarium, sternum, posterior ribs, thoracic and lumbar spine, pelvis, and proximal RIGHT femur. Question subtle metastatic lesion at mid RIGHT femoral diaphysis. Fairly symmetric uptake is seen at the humeral heads, proximal femora, knees, and ankles, nonspecific but symmetry could reflect marrow reconversion. Expected urinary tract and soft tissue distribution of tracer. IMPRESSION: Scattered osseous metastases. Metastatic lesion involving proximal RIGHT femur and questionably mid RIGHT femoral diaphysis. Electronically Signed   By: MLavonia DanaM.D.   On: 02/09/2020 14:38   CT CHEST ABDOMEN PELVIS W CONTRAST  Result Date: 02/11/2020 CLINICAL DATA:  Metastatic bone disease.  Cancer of unknown primary. EXAM: CT CHEST, ABDOMEN, AND PELVIS WITH CONTRAST TECHNIQUE: Multidetector CT imaging of the chest, abdomen and pelvis was performed following the standard protocol during bolus administration of intravenous contrast. CONTRAST:  70m OMNIPAQUE IOHEXOL 300 MG/ML  SOLN COMPARISON:  None. FINDINGS: CT CHEST FINDINGS Cardiovascular: The heart size is normal. No substantial pericardial effusion. Coronary artery calcification is evident. Atherosclerotic calcification is noted in the wall of the thoracic aorta. Mediastinum/Nodes: No mediastinal lymphadenopathy. There is no hilar lymphadenopathy.  The esophagus has normal imaging features. No right axillary lymphadenopathy. Lymph nodes in the left axilla are asymmetrically prominent but not enlarged by CT criteria. 8 mm short axis left subpectoral node visible on 18/2. 10 mm short axis left axillary node visible on 24/2. Lungs/Pleura: Diffuse bronchial wall thickening noted in both lungs with scattered areas of small airway impaction (well demonstrated left apex on 32/4, posterior right lower lobe on 78/4 and right middle lobe on 93/4). Associated tree-in-bud nodularity seen in the right middle lobe, anterior right upper lobe and posterior right upper lobe with 1 of the more dominant nodule seen in the peripheral right upper lobe on 53/4 measuring 5 mm. Tiny bilateral pleural effusions associated. Musculoskeletal: Innumerable sclerotic bone lesions are seen in the bony anatomy of the chest. CT ABDOMEN PELVIS FINDINGS Hepatobiliary: Small hypoattenuating lesions in the liver are too small to characterize and measure in the 5-10 mm size range. No overtly suspicious enhancing mass lesion in the hepatic parenchyma. Gallbladder surgically absent. No intrahepatic or extrahepatic biliary dilation. Pancreas: No focal mass lesion. No dilatation of the main duct. No intraparenchymal cyst. No peripancreatic edema. Spleen: No splenomegaly. No focal mass lesion. Adrenals/Urinary Tract: No adrenal nodule or mass. Right kidney unremarkable. There is marked left-sided hydronephrosis with areas of cortical thinning overlying the dilated calices. 6 mm stone noted lower pole with 2 mm interpolar left renal stone. There is substantial perinephric edema/stranding. Potential tiny calcification in the region of the UPJ (coronal 78/5) with no dilatation of the proximal left ureter left ureter is nondilated but appears ill-defined. The urinary bladder appears normal for the degree of distention. Stomach/Bowel: Stomach is unremarkable. No gastric wall thickening. No evidence of outlet  obstruction. Duodenum is normally positioned as is the ligament of Treitz. Duodenal diverticulum noted. No small bowel wall thickening. No small bowel dilatation. The terminal ileum is normal. The appendix is normal. No gross colonic mass. No colonic wall thickening. Diverticular changes are noted in the left colon without evidence of diverticulitis. Vascular/Lymphatic: There is abdominal aortic atherosclerosis without aneurysm. No gastrohepatic or hepatoduodenal ligament lymphadenopathy. Clustered small lymph nodes are seen in the left para-aortic space at the level of the left renal vein. Left renal vein appears attenuated. No pelvic sidewall lymphadenopathy. Reproductive: Uterus unremarkable. No adnexal mass. Prominent volume fluid noted in the vagina. Other: There is some trace free fluid in the pelvis along the dome of the bladder. Musculoskeletal: Innumerable sclerotic bone lesions are seen in the lumbar spine and bony pelvis. IMPRESSION: 1. Innumerable sclerotic bone lesions in the bony anatomy of the chest, abdomen, and pelvis, consistent with metastatic disease. 2. Left axillary and subpectoral lymph nodes are asymmetrically prominent but not enlarged by CT criteria. 3. Marked left-sided hydronephrosis with areas of cortical thinning overlying the dilated calices. Potential tiny calcification in the region of the left UPJ with no dilatation of the proximal left ureter. Etiology of the left hydronephrosis not evident by CT although urothelial lesion cannot be excluded. 4. Left nephrolithiasis. 5. Diffuse bronchial wall thickening bilaterally with scattered areas of small airway impaction and tree-in-bud nodularity. Imaging features likely related to infectious/inflammatory etiology. Atypical infection (including MAI) would be a consideration. 6. Small hypoattenuating lesions  in the liver are too small to characterize. Attention on follow-up recommended. These are likely cysts, but MRI of the abdomen with and  without contrast could be used to confirm as clinically warranted. 7. Prominent volume fluid in the vagina of indeterminate etiology. No fluid or thickening of the endometrial canal in the uterus. 8. Aortic Atherosclerosis (ICD10-I70.0). Electronically Signed   By: Misty Stanley M.D.   On: 02/11/2020 06:19   CT BIOPSY  Result Date: 02/12/2020 CLINICAL DATA:  Diffuse sclerotic bony metastatic lesions with unknown primary. EXAM: CT GUIDED BONE MARROW ASPIRATION AND BIOPSY ANESTHESIA/SEDATION: Versed 1.0 mg IV, Fentanyl 100 mcg IV Total Moderate Sedation Time:   22 minutes. The patient's level of consciousness and physiologic status were continuously monitored during the procedure by Radiology nursing. PROCEDURE: The procedure risks, benefits, and alternatives were explained to the patient. Questions regarding the procedure were encouraged and answered. The patient understands and consents to the procedure. A time out was performed prior to initiating the procedure. The right gluteal region was prepped with chlorhexidine. Sterile gown and sterile gloves were used for the procedure. Local anesthesia was provided with 1% Lidocaine. Under CT guidance, an 11 gauge On Control bone cutting needle was advanced from a posterior approach into the right iliac bone. Needle positioning was confirmed with CT. Initial non heparinized and heparinized aspirate samples were obtained of bone marrow. Core biopsy was performed via the On Control drill needle. COMPLICATIONS: None FINDINGS: Initial CT through the bony pelvis demonstrates multiple sclerotic lesions involving bilateral iliac bones, both sides of the sacrum and bilateral ischia. Inspection of initial aspirate did reveal visible particles. Intact core biopsy sample was obtained. The core sample was obtained through the level of sclerotic involvement of the right iliac bone. IMPRESSION: CT guided bone marrow biopsy of right posterior iliac bone with both aspirate and core  samples obtained. Electronically Signed   By: Aletta Edouard M.D.   On: 02/12/2020 13:13   DG CHEST PORT 1 VIEW  Result Date: 02/07/2020 CLINICAL DATA:  New onset shortness of breath EXAM: PORTABLE CHEST 1 VIEW COMPARISON:  Three days ago FINDINGS: Large lung volumes with diaphragm flattening. Trace pleural effusions and generalized reticulation that is stable. Normal heart size for technique. No pneumothorax. IMPRESSION: 1. Trace pleural effusions with mild scarring or atelectasis at the left base. 2. Stable interstitial prominence, favored bronchitic Electronically Signed   By: Monte Fantasia M.D.   On: 02/07/2020 08:46   DG CHEST PORT 1 VIEW  Result Date: 02/04/2020 CLINICAL DATA:  Shortness of breath. EXAM: PORTABLE CHEST 1 VIEW COMPARISON:  February 03, 2020 FINDINGS: Mild, stable, diffuse chronic appearing increased interstitial lung markings are seen. Mild, stable atelectasis is noted within the lateral aspect of the left lung base. There is no evidence of a pleural effusion or pneumothorax. The heart size and mediastinal contours are within normal limits. Radiopaque and lucent artifact is seen overlying the lateral aspect of the mid left chest wall. The visualized skeletal structures are otherwise unremarkable. IMPRESSION: Mild, stable left basilar atelectasis. Electronically Signed   By: Virgina Norfolk M.D.   On: 02/04/2020 20:00   DG Chest Port 1 View  Result Date: 02/03/2020 CLINICAL DATA:  Shortness of breath and cough for 1 month EXAM: PORTABLE CHEST 1 VIEW COMPARISON:  None. FINDINGS: Cardiac shadow is at the upper limits of normal in size. Aortic calcifications are seen. The lungs are well aerated bilaterally with mild left basilar atelectasis/early infiltrate. No bony abnormality is noted.  IMPRESSION: Left basilar opacity consistent with atelectasis/infiltrate. Electronically Signed   By: Inez Catalina M.D.   On: 02/03/2020 20:06   CT BONE MARROW BIOPSY & ASPIRATION  Result  Date: 02/12/2020 CLINICAL DATA:  Diffuse sclerotic bony metastatic lesions with unknown primary. EXAM: CT GUIDED BONE MARROW ASPIRATION AND BIOPSY ANESTHESIA/SEDATION: Versed 1.0 mg IV, Fentanyl 100 mcg IV Total Moderate Sedation Time:   22 minutes. The patient's level of consciousness and physiologic status were continuously monitored during the procedure by Radiology nursing. PROCEDURE: The procedure risks, benefits, and alternatives were explained to the patient. Questions regarding the procedure were encouraged and answered. The patient understands and consents to the procedure. A time out was performed prior to initiating the procedure. The right gluteal region was prepped with chlorhexidine. Sterile gown and sterile gloves were used for the procedure. Local anesthesia was provided with 1% Lidocaine. Under CT guidance, an 11 gauge On Control bone cutting needle was advanced from a posterior approach into the right iliac bone. Needle positioning was confirmed with CT. Initial non heparinized and heparinized aspirate samples were obtained of bone marrow. Core biopsy was performed via the On Control drill needle. COMPLICATIONS: None FINDINGS: Initial CT through the bony pelvis demonstrates multiple sclerotic lesions involving bilateral iliac bones, both sides of the sacrum and bilateral ischia. Inspection of initial aspirate did reveal visible particles. Intact core biopsy sample was obtained. The core sample was obtained through the level of sclerotic involvement of the right iliac bone. IMPRESSION: CT guided bone marrow biopsy of right posterior iliac bone with both aspirate and core samples obtained. Electronically Signed   By: Aletta Edouard M.D.   On: 02/12/2020 13:13   ECHOCARDIOGRAM COMPLETE  Result Date: 02/05/2020    ECHOCARDIOGRAM REPORT   Patient Name:   Kathleen Schneider Date of Exam: 02/04/2020 Medical Rec #:  [id number]       Height:       60.0 in Accession #:    [id number]      Weight:        185.6 lb Date of Birth:  04-15-1941       BSA:          1.808 m Patient Age:    37 years        BP:           124/83 mmHg Patient Gender: F               HR:           76 bpm. Exam Location:  Inpatient Procedure: 2D Echo Indications:    786.09 dyspnea  History:        Patient has no prior history of Echocardiogram examinations.                 Risk Factors:Former Smoker and Hypertension.  Sonographer:    Jannett Celestine RDCS (AE) Referring Phys: [id number] [facility] Julious Oka  Sonographer Comments: Image acquisition challenging due to patient body habitus. IMPRESSIONS  1. Left ventricular ejection fraction, by estimation, is 65 to 70%. The left ventricle has normal function. The left ventricle has no regional wall motion abnormalities. Left ventricular diastolic parameters are consistent with Grade I diastolic dysfunction (impaired relaxation). Elevated left atrial pressure.  2. Right ventricular systolic function is normal. The right ventricular size is normal. There is normal pulmonary artery systolic pressure.  3. The mitral valve is normal in structure. Mild mitral valve regurgitation. No evidence of mitral stenosis.  4. The aortic valve  is normal in structure. Aortic valve regurgitation is not visualized. Mild to moderate aortic valve sclerosis/calcification is present, without any evidence of aortic stenosis.  5. The inferior vena cava is normal in size with greater than 50% respiratory variability, suggesting right atrial pressure of 3 mmHg. Comparison(s): No prior Echocardiogram. FINDINGS  Left Ventricle: Left ventricular ejection fraction, by estimation, is 65 to 70%. The left ventricle has normal function. The left ventricle has no regional wall motion abnormalities. The left ventricular internal cavity size was normal in size. There is  no left ventricular hypertrophy. Left ventricular diastolic parameters are consistent with Grade I diastolic dysfunction (impaired relaxation). Elevated left atrial pressure. Right  Ventricle: The right ventricular size is normal. No increase in right ventricular wall thickness. Right ventricular systolic function is normal. There is normal pulmonary artery systolic pressure. Left Atrium: Left atrial size was normal in size. Right Atrium: Right atrial size was normal in size. Pericardium: There is no evidence of pericardial effusion. Mitral Valve: The mitral valve is normal in structure. Mild mitral valve regurgitation. No evidence of mitral valve stenosis. Tricuspid Valve: The tricuspid valve is normal in structure. Tricuspid valve regurgitation is trivial. No evidence of tricuspid stenosis. Aortic Valve: The aortic valve is normal in structure. Aortic valve regurgitation is not visualized. Mild to moderate aortic valve sclerosis/calcification is present, without any evidence of aortic stenosis. Pulmonic Valve: The pulmonic valve was normal in structure. Pulmonic valve regurgitation is not visualized. No evidence of pulmonic stenosis. Aorta: The aortic root is normal in size and structure. Venous: The inferior vena cava is normal in size with greater than 50% respiratory variability, suggesting right atrial pressure of 3 mmHg. IAS/Shunts: No atrial level shunt detected by color flow Doppler.  LEFT VENTRICLE PLAX 2D LVIDd:         5.07 cm  Diastology LVIDs:         2.88 cm  LV e' medial:    8.38 cm/s LV PW:         0.85 cm  LV E/e' medial:  11.8 LV IVS:        0.90 cm  LV e' lateral:   8.05 cm/s LVOT diam:     2.20 cm  LV E/e' lateral: 12.2 LV SV:         97 LV SV Index:   54 LVOT Area:     3.80 cm  RIGHT VENTRICLE RV S prime:     11.10 cm/s TAPSE (M-mode): 1.8 cm LEFT ATRIUM           Index LA diam:      4.00 cm 2.21 cm/m LA Vol (A2C): 55.8 ml 30.86 ml/m  AORTIC VALVE LVOT Vmax:   99.80 cm/s LVOT Vmean:  65.100 cm/s LVOT VTI:    0.256 m  AORTA Ao Root diam: 2.70 cm MITRAL VALVE MV Area (PHT): 3.27 cm    SHUNTS MV Decel Time: 232 msec    Systemic VTI:  0.26 m MV E velocity: 98.50 cm/s   Systemic Diam: 2.20 cm MV A velocity: 88.30 cm/s MV E/A ratio:  1.12 Ena Dawley MD Electronically signed by Ena Dawley MD Signature Date/Time: 02/05/2020/9:18:19 AM    Final    DG HIPS BILAT WITH PELVIS 3-4 VIEWS  Result Date: 02/07/2020 CLINICAL DATA:  Increased weakness and shortness of breath. EXAM: DG HIP (WITH OR WITHOUT PELVIS) 3-4V BILAT COMPARISON:  None. FINDINGS: There is no evidence of an acute hip fracture or dislocation. Mild degenerative changes seen involving  both hips, in the form of joint space narrowing and acetabular sclerosis. Numerous ill-defined sclerotic foci are seen scattered throughout the pelvis and bilateral lower extremities. IMPRESSION: 1. Numerous sclerotic foci, as described above, which may represent sequelae associated with osseous metastasis. Correlation with a nuclear medicine bone scan is recommended. 2. Degenerative changes involving both hips. Electronically Signed   By: Virgina Norfolk M.D.   On: 02/07/2020 16:12    LOS: 13 days

## 2020-02-16 NOTE — Progress Notes (Signed)
PROGRESS NOTE  Kathleen Schneider VXY:[id number] DOB: [birth year] DOA: 02/03/2020 PCP: Donald Prose, MD  HPI/Recap of past 24 hours: 79 y.o.femalewith medical history significant ofhypertension, recurrent UTI, chronic kidney disease stage IIIB, excessive daytime sleepiness, gait abnormalities, visual changes, who came in from home with progressive shortness of breath and weakness of 4 days duration.  Has been mostly in bed. She came to the ER and was noted to be hypoxic requiring 2 L of oxygen [place] to maintain her sats. COVID-19 negative but with evidence of pneumonia versus CHF. Her BNP was elevated but also had leukocytosis and infiltrates with concern for CAP. Admitted by [facility], hospitalist team.  Treated for CAP and AKI.  On 02/07/20 she had X-ray imaging study of her hip which showed sclerotic bone lesion, bone scan was recommended by radiology.  She underwent bone scan on 02/10/2020 which showed diffuse metastatic lesion in her bone.  She has denied bone pain.  Denied a family history of cancer.  She had an aspirate and core biopsy performed of the bone marrow in the right iliac bone on 02/12/2020 by IR Dr. Kathlene Cote.  Medical Oncology Dr. Alvy Bimler followed.  Palliative care team was consulted to assist with establishing goals of care.  On 02/16/20 bone marrow biopsy revealed metastatic carcinoma.  CT-guided bone marrow aspirate and biopsy obtained on 02/12/2020 results are consistent with metastatic breast carcinoma.  Medical oncology discussed with the patient.  Due to her poor performance status she is not a candidate for systemic chemotherapy.  Oncology will consider her for antiestrogen therapy such as tamoxifen or an aromatase inhibitor.    02/16/20: Seen and examined at her bedside.  She is sad regarding her social situation.  Her insurance has declined SNF placement due to recent 30-day stay in SNF and no additional benefit was thought to be derived from an additional stay.  Plan is to return to  home with home health services possibly tomorrow.  She will need 24 hour assistance and supervision as the patient is not safe to be left alone.  Her discharge is pending home health services arrangement.    Assessment/Plan: Principal Problem:   Acute respiratory failure with hypoxia (HCC) Active Problems:   Spinal stenosis, lumbar   ARF (acute renal failure) (HCC)   Hyponatremia   Leucocytosis   Hypokalemia   Elevated brain natriuretic peptide (BNP) level   Sepsis (HCC)   Metastatic cancer to bone [facility])   Advanced care planning/counseling discussion   Goals of care, counseling/discussion   Palliative care by specialist   Community acquired pneumonia of left lower lobe of lung   Sepsis, resolved, present on admission due to CAP:  She presented tachypneic tachycardic and febrile at 103.1 with lactic acidosis and leukocytosis.  Chest x-ray with left basilar opacity consistent with atelectasis or infiltrates. She was treated with Rocephin and azithromycin, completed course. Follow-up blood cultures-staph epidermidis 02/04/20 thought to be a contaminant. Influenza negative, Covid-19 screening negative. MRSA PCR negative.  Patient became hypoxic on 02/07/2020 requiring 6 L of oxygen.  She received multiple doses of IV Lasix.  Currently she is saturating 99% on 2 L.  Afebrile with no leukocytosis, nontoxic-appearing.  Diffused sclerotic bony lesions secondary to metastatic breast cancer. On 02/07/20 she had X-ray imaging study of her hips bilaterally which showed sclerotic bone lesions, bone scan was recommended by radiology.  She underwent bone scan on 02/10/2020 which showed diffuse metastatic lesion in her bone.  She has denied bone pain.  Denied a  family history of cancer.  She had an aspirate and core biopsy performed of the bone marrow in the right iliac bone on 02/12/2020 by IR Dr. Kathlene Cote, results consistent with metastatic breast carcinoma.  Medical oncology discussed results  with the patient on 02/16/20. Diffuse sclerotic bone lesions thought to be secondary to metastatic breast cancer. Due to her poor performance status she is not a candidate for systemic chemotherapy.  Oncology will consider her for antiestrogen therapy such as tamoxifen or an aromatase inhibitor.  She will need to follow-up with medical oncology post discharge to discuss treatment options.  Newly diagnosed metastatic breast cancer to the bones. Management as stated above.  Dehydration due to poor oral intake Continue IV fluid Continue to encourage increase in oral intake Continue to monitor volume status and adjust IV fluid as indicated.  Uncontrolled depression likely secondary to ongoing social stressors Currently on Lexapro Psych consulted to assist on 02/15/20  AKI on CKD 3B, likely prerenal in the setting of dehydration Baseline creatinine appears to be 1.01 with GFR of 35 Creatinine 1.4 from 1.76 with IV fluid  Repeat renal panel in the morning, she is not at her baseline, continue IV fluid due to poor oral intake. Continue to encourage oral intake  Continue to monitor urine output Continue to avoid nephrotoxins  Resolved Hypovolemic hyponatremia post IV fluid NS Serum sodium 132>> 135 C/w Normal saline at 50 cc/h  Resolved acute hypoxic respiratory failure multifactorial secondary to community-acquired pneumonia and atelectasis Completed course of empiric antibiotics for CAP, Rocephin and azithromycin. Not on oxygen supplementation at baseline Currently on 1 L with O2 saturation 97%. Continue incentive spirometer Out of bed to chair, mobilize as tolerated.  Generalized weakness/physical debility Assessment PT OT with recommendation for SNF TOC assisting with disposition to home with home health services Continue PT OT with assistance and fall precautions.  Right > Left hydronephrosis:  -Urology consulted, no plan for surgical intervention at this time.  -Continue to  monitor urine output Continue oxybutynin  Tree-in-bud opacities on CT scan:  - Pulmonary consulted. With no sputum for culture/AFB and improving respiratory symptoms, 3 month repeat CT with consideration for bronchoscopy at that time is recommended. No evidence of primary pulmonary malignancy.  Chronic diastolic CHF Echo 95/12/3265 with ejection fraction 65 to 70% with normal LV function no regional wall motion abnormalities.  Grade 1 diastolic dysfunction. IVC normal caliber and phasicity.  -BNP 557 on 02/04/2020 Monitor volume status while on gentle IV fluid. Continues with strict I's and O's and daily weight  Mildly elevated troponin likely secondary to demand ischemia Denies any anginal symptoms at the time of this visit Troponin S peaked at 61 on 02/04/2020 Continue to monitor on telemetry  Chronic depression anxiety Stable Continue home regimen Psych consulted  Obesity: Estimated body mass index is 36.25 kg/m as calculated from the following:   Height as of this encounter: 5' (1.524 m).   Weight as of this encounter: 84.2 kg.  DVT prophylaxis:  Subcu Lovenox daily Code Status:DNR Family Communication: None at bedside.  Consultants:   Oncology, Dr. Alvy Bimler  Pulmonology  Palliative Care  Urology  Psychiatry  Procedures: None Antimicrobials:  Completed course of Rocephin and azithromycin    Status is: Inpatient   Dispo: The patient is from: Home.              Anticipated d/c is to: Home with home health services  Anticipated d/c date is: 02/17/20              Patient currently not stable for discharge due to poor oral intake on IV fluid to avoid dehydration.        Objective: Vitals:   02/15/20 0341 02/15/20 1338 02/15/20 1936 02/16/20 0405  BP: (!) 121/54 112/67 130/68 126/63  Pulse: 69 67 66 66  Resp: (!) _0 Temp: 97.8 F (36.6 C) 97.6 F (36.4 C) 97.8 F (36.6 C) 99 F (37.2 C)  TempSrc: Oral Oral Oral   SpO2:  100% 99% 100% 98%  Weight:      Height:        Intake/Output Summary (Last 24 hours) at 02/16/2020 1302 Last data filed at 02/16/2020 1230 Gross per 24 hour  Intake 360 ml  Output 850 ml  Net -490 ml   Filed Weights   02/04/20 1300  Weight: 84.2 kg    Exam:   . General: 79 y.o. year-old female well-developed well-nourished no acute distress.  Flat affect.  She is alert and oriented x3. . Cardiovascular: Regular rate and rhythm no rubs or gallops.  Marland Kitchen Respiratory: Clear to auscultation no wheezes or rales. . Abdomen: Soft nontender normal bowel sounds present. . Musculoskeletal: No lower extremity edema bilaterally.  Marland Kitchen Psychiatry: Flat affect.  Data Reviewed: CBC: Recent Labs  Lab 02/12/20 0315 02/13/20 0342 02/15/20 0331  WBC 9.0 9.1 6.7  NEUTROABS 6.8  --   --   HGB 9.0* 9.2* 8.7*  HCT 30.0* 30.3* 28.8*  MCV 94.0 93.5 94.1  PLT 316 345 549   Basic Metabolic Panel: Recent Labs  Lab 02/10/20 0506 02/12/20 0315 02/13/20 0342 02/15/20 0331  NA  --  133* 132* 135  K  --  4.9 4.3 4.4  CL  --  92* 93* 98  CO2  --  32 31 31  GLUCOSE  --  89 107* 131*  BUN  --  _1 CREATININE 1.45* 1.50* 1.76* 1.44*  CALCIUM  --  8.6* 8.4* 8.5*   GFR: Estimated Creatinine Clearance: 30 mL/min (A) (by C-G formula based on SCr of 1.44 mg/dL (H)). Liver Function Tests: No results for input(s): AST, ALT, ALKPHOS, BILITOT, PROT, ALBUMIN in the last 168 hours. No results for input(s): LIPASE, AMYLASE in the last 168 hours. No results for input(s): AMMONIA in the last 168 hours. Coagulation Profile: No results for input(s): INR, PROTIME in the last 168 hours. Cardiac Enzymes: No results for input(s): CKTOTAL, CKMB, CKMBINDEX, TROPONINI in the last 168 hours. BNP (last 3 results) No results for input(s): PROBNP in the last 8760 hours. HbA1C: No results for input(s): HGBA1C in the last 72 hours. CBG: No results for input(s): GLUCAP in the last 168 hours. Lipid Profile: No  results for input(s): CHOL, HDL, LDLCALC, TRIG, CHOLHDL, LDLDIRECT in the last 72 hours. Thyroid Function Tests: No results for input(s): TSH, T4TOTAL, FREET4, T3FREE, THYROIDAB in the last 72 hours. Anemia Panel: No results for input(s): VITAMINB12, FOLATE, FERRITIN, TIBC, IRON, RETICCTPCT in the last 72 hours. Urine analysis:    Component Value Date/Time   COLORURINE AMBER (A) 02/03/2020 1812   APPEARANCEUR HAZY (A) 02/03/2020 1812   LABSPEC 1.020 02/03/2020 1812   PHURINE 5.0 02/03/2020 1812   GLUCOSEU NEGATIVE 02/03/2020 1812   HGBUR MODERATE (A) 02/03/2020 1812   BILIRUBINUR NEGATIVE 02/03/2020 1812   KETONESUR 5 (A) 02/03/2020 1812   PROTEINUR 100 (A) 02/03/2020 1812   NITRITE  NEGATIVE 02/03/2020 1812   LEUKOCYTESUR TRACE (A) 02/03/2020 1812   Sepsis Labs: _0 (procalcitonin:4,lacticidven:4)  ) Recent Results (from the past 240 hour(s))  SARS Coronavirus 2 by RT PCR (hospital order, performed in [facility] hospital lab) Nasopharyngeal Nasopharyngeal Swab     Status: None   Collection Time: 02/08/20  4:25 PM   Specimen: Nasopharyngeal Swab  Result Value Ref Range Status   SARS Coronavirus 2 NEGATIVE NEGATIVE Final    Comment: (NOTE) SARS-CoV-2 target nucleic acids are NOT DETECTED.  The SARS-CoV-2 RNA is generally detectable in upper and lower respiratory specimens during the acute phase of infection. The lowest concentration of SARS-CoV-2 viral copies this assay can detect is 250 copies / mL. A negative result does not preclude SARS-CoV-2 infection and should not be used as the sole basis for treatment or other patient management decisions.  A negative result may occur with improper specimen collection / handling, submission of specimen other than nasopharyngeal swab, presence of viral mutation(s) within the areas targeted by this assay, and inadequate number of viral copies (<250 copies / mL). A negative result must be combined with clinical observations,  patient history, and epidemiological information.  Fact Sheet for Patients:   StrictlyIdeas.no  Fact Sheet for Healthcare Providers: BankingDealers.co.za  This test is not yet approved or  cleared by the [place] FDA and has been authorized for detection and/or diagnosis of SARS-CoV-2 by FDA under an Emergency Use Authorization (EUA).  This EUA will remain in effect (meaning this test can be used) for the duration of the COVID-19 declaration under Section 564(b)(1) of the Act, 21 U.S.C. section 360bbb-3(b)(1), unless the authorization is terminated or revoked sooner.  Performed at [facility], [place] [address] [place], [place] [address]       Studies: No results found.  Scheduled Meds: . enoxaparin (LOVENOX) injection  30 mg Subcutaneous Q24H  . escitalopram  5 mg Oral Daily  . lamoTRIgine  150 mg Oral BID  . lidocaine  2 patch Transdermal Q24H  . oxybutynin  5 mg Oral BID    Continuous Infusions: . sodium chloride    . sodium chloride 30 mL/hr at 02/16/20 0850     LOS: 13 days     Kayleen Memos, MD [facility] Pager [phone]  If 7PM-7AM, please contact night-coverage [email or web address] Password [email or web address] 02/16/2020, 1:02 PM

## 2020-02-17 ENCOUNTER — Ambulatory Visit: Payer: PPO | Admitting: Family Medicine

## 2020-02-17 ENCOUNTER — Encounter: Payer: Self-pay | Admitting: Family Medicine

## 2020-02-17 LAB — CBC
HCT: 30 % — ABNORMAL LOW (ref 36.0–46.0)
Hemoglobin: 9 g/dL — ABNORMAL LOW (ref 12.0–15.0)
MCH: 28.2 pg (ref 26.0–34.0)
MCHC: 30 g/dL (ref 30.0–36.0)
MCV: 94 fL (ref 80.0–100.0)
Platelets: 449 10*3/uL — ABNORMAL HIGH (ref 150–400)
RBC: 3.19 MIL/uL — ABNORMAL LOW (ref 3.87–5.11)
RDW: 14.9 % (ref 11.5–15.5)
WBC: 6.2 10*3/uL (ref 4.0–10.5)
nRBC: 0 % (ref 0.0–0.2)

## 2020-02-17 LAB — BASIC METABOLIC PANEL
Anion gap: 7 (ref 5–15)
BUN: 12 mg/dL (ref 8–23)
CO2: 32 mmol/L (ref 22–32)
Calcium: 8.9 mg/dL (ref 8.9–10.3)
Chloride: 96 mmol/L — ABNORMAL LOW (ref 98–111)
Creatinine, Ser: 1.34 mg/dL — ABNORMAL HIGH (ref 0.44–1.00)
GFR, Estimated: 40 mL/min — ABNORMAL LOW (ref >60)
Glucose, Bld: 104 mg/dL — ABNORMAL HIGH (ref 70–99)
Potassium: 4.1 mmol/L (ref 3.5–5.1)
Sodium: 135 mmol/L (ref 135–145)

## 2020-02-17 MED ORDER — ACETAMINOPHEN 500 MG PO TABS: 500.0000 mg | ORAL_TABLET | Freq: Four times a day (QID) | ORAL | Status: DC | PRN

## 2020-02-17 MED ORDER — LIDOCAINE 5 % EX PTCH
2.0000 | MEDICATED_PATCH | CUTANEOUS | Status: DC
  Administered 2020-02-17 – 2020-02-18 (×2): 2 via TRANSDERMAL
  Filled 2020-02-17 (×2): qty 2

## 2020-02-17 NOTE — Progress Notes (Signed)
Physical Therapy Treatment Patient Details Name: Kathleen Schneider MRN: [id number] DOB: 08-14-1940 Today's Date: 02/17/2020    History of Present Illness 79 yo female admitted with Pna, Sepsis after falling at home. Hx of CKD, gait abnormality, visual deficits, spinal stenosis. S/p RT iliac bone biopsy on 02/12/20 and results consistent with metastatic breast carcinoma.    PT Comments    Patient did ambulate x 10' with Rw and mod assistance. SPO2 on RA >94% for mobility.  patient very emotional at end of session, expresses concern for DC and having an aid.   Follow Up Recommendations  SNF (has to Dc home)     Equipment Recommendations  None recommended by PT    Recommendations for Other Services       Precautions / Restrictions Precautions Precautions: Fall Precaution Comments: monitor O2    Mobility  Bed Mobility   Bed Mobility: Supine to Sit     Supine to sit: Min guard     General bed mobility comments: Increased time, able to move legs to bed edge and used rail to push self upright. Did not want assistance. Able to scoot to edge  Transfers Overall transfer level: Needs assistance Equipment used: Rolling walker (2 wheeled) Transfers: Sit to/from Stand Sit to Stand: Mod assist         General transfer comment: Frequent verbal cues for sequencing steps and negotiating walker.  Ambulation/Gait Ambulation/Gait assistance: Min assist;+2 safety/equipment Gait Distance (Feet): 5 Feet (1hen 10') Assistive device: Rolling walker (2 wheeled) Gait Pattern/deviations: Step-to pattern;Decreased stride length;Decreased step length - right;Decreased step length - left;Trunk flexed Gait velocity: decr   General Gait Details: Cues for posture, RW proximity, proper use of RW. Assist to stabilize/support pt and to follow with recliner. O2 88% on RA, dyspnea 2/4. High fall risk.   Stairs             Wheelchair Mobility    Modified Rankin (Stroke Patients Only)        Balance   Sitting-balance support: Bilateral upper extremity supported;Feet supported Sitting balance-[name]: Fair     Standing balance support: Bilateral upper extremity supported;During functional activity Standing balance-[name]: Poor                              Cognition Arousal/Alertness: Awake/alert Behavior During Therapy: Anxious Overall Cognitive Status: No family/caregiver present to determine baseline cognitive functioning Area of Impairment: Safety/judgement;Awareness;Memory                               General Comments: patient very tearful about Dc home" I need the aide"      Exercises      General Comments        Pertinent Vitals/Pain Pain Assessment: No/denies pain    Home Living                      Prior Function            PT Goals (current goals can now be found in the care plan section) Progress towards PT goals: Progressing toward goals    Frequency    Min 2X/week      PT Plan Current plan remains appropriate;Frequency needs to be updated    Co-evaluation              AM-PAC PT "6 Clicks" Mobility   Outcome  Measure  Help needed turning from your back to your side while in a flat bed without using bedrails?: A Little Help needed moving from lying on your back to sitting on the side of a flat bed without using bedrails?: A Little Help needed moving to and from a bed to a chair (including a wheelchair)?: A Lot Help needed standing up from a chair using your arms (e.g., wheelchair or bedside chair)?: A Lot Help needed to walk in hospital room?: A Lot Help needed climbing 3-5 steps with a railing? : Total 6 Click Score: 13    End of Session Equipment Utilized During Treatment: Gait belt Activity Tolerance: Patient tolerated treatment well Patient left: in chair;with call bell/phone within reach;with chair alarm set Nurse Communication: Mobility status PT Visit Diagnosis:  Muscle weakness (generalized) (M62.81);History of falling (Z91.81);Unsteadiness on feet (R26.81)     Time: 1586-8257 PT Time Calculation (min) (ACUTE ONLY): 34 min  Charges:  $Gait Training: 23-37 mins                     [place] Pager [phone] Office [phone]    [name] 02/17/2020, 4:08 PM

## 2020-02-17 NOTE — TOC Progression Note (Addendum)
Transition of Care [facility]) - Progression Note    Patient Details  Name: NAMIYAH GRANTHAM MRN: [id number] Date of Birth: August 11, 1940  Transition of [facility] CM/SW [place], [place] Phone Number: 02/17/2020, 2:37 PM  Clinical Narrative:   Plan is coming together for Ms Fitzpatrick to return home.  Have made referral to [facility] at [facility] for outpatient palliative services.  Have alerted [place] with [facility].  Have confirmed with [name] with [place] that she can have someone start with patient tonight from 5-9PM. Have spoken with [name] to confirm that she is able to pick up medication [she requests scripts be sent to [place] and that she or her mother will make sure house is open for aide and patient to enter.  TOC will continue to follow during the course of hospitalization.  Addendum:  At this point I have alerted MD that we should wait until tomorrow for discharge as there are too many moving pieces here, he has not yet been able to start d/c summary, and [facility] just picked up a patient at 2:45 that I called about at noon.      Expected Discharge Plan: [place] Barriers to Discharge: Barriers Resolved  Expected Discharge Plan and Services Expected Discharge Plan: [place]   Discharge Planning Services: CM Consult Post Acute Care Choice: [place] arrangements for the past 2 months: Single Family Home Expected Discharge Date: 02/09/20                                     Social Determinants of Health (SDOH) Interventions    Readmission Risk Interventions Readmission Risk Prevention Plan 12/10/2019  Medication Screening Complete  Transportation Screening Complete  Some recent data might be hidden

## 2020-02-17 NOTE — Progress Notes (Signed)
[facility])  Referral received for outpatient palliative care once pt returns home.  [facility] will plan to follow up with the patient once she is at home.  Venia Carbon RN, BSN, [facility] Liaison

## 2020-02-17 NOTE — Progress Notes (Signed)
Daily Progress Note   Patient Name: Kathleen Schneider       Date: 02/17/2020 DOB: [birth year]  Age: 79 y.o. MRN#: [id number] Attending Physician: Cherene Altes, MD Primary Care Physician: Donald Prose, MD Admit Date: 02/03/2020  Reason for Consultation/Follow-up: Establishing goals of care  Subjective: Patient in bed.  She is stable for discharge.  Social work is working on support at home for her.  She received her diagnosis of metastatic breast cancer, and plans on pursuing hormone therapy for this. MOST form was reviewed and completed with the following selections, DNR, comfort measures only, no antibiotics, no IV fluids, no feeding tube.  If patient were to have be declining she would prefer to allow for natural dying process, with comfort measures only. We discussed that eventually if comfort measures were her choice then she would be appropriate for referral for hospice support.  For now she would like outpatient palliative to follow her for when she chooses to implement this.  ROS  Length of Stay: 14  Current Medications: Scheduled Meds:  . enoxaparin (LOVENOX) injection  30 mg Subcutaneous Q24H  . escitalopram  10 mg Oral Daily  . lamoTRIgine  150 mg Oral BID  . lidocaine  2 patch Transdermal Q24H  . oxybutynin  5 mg Oral BID    Continuous Infusions: . sodium chloride    . sodium chloride 30 mL/hr at 02/16/20 0850    PRN Meds: sodium chloride, acetaminophen, ipratropium-albuterol, ondansetron (ZOFRAN) IV  Physical Exam Vitals and nursing note reviewed.  Pulmonary:     Effort: Pulmonary effort is normal.  Neurological:     Mental Status: She is alert.             Vital Signs: BP (!) 118/59 (BP Location: Right Wrist)   Pulse 61   Temp 97.7 F (36.5 C) (Oral)    Resp 15   Ht 5' (1.524 m)   Wt 84.2 kg   SpO2 98%   BMI 36.25 kg/m  SpO2: SpO2: 98 % O2 Device: O2 Device: Nasal Cannula O2 Flow Rate: O2 Flow Rate (L/min): 2 L/min  Intake/output summary:   Intake/Output Summary (Last 24 hours) at 02/17/2020 1221 Last data filed at 02/17/2020 0900 Gross per 24 hour  Intake 660.7 ml  Output 950 ml  Net -289.3 ml  LBM: Last BM Date: 02/15/20 Baseline Weight: Weight: 84.2 kg Most recent weight: Weight: 84.2 kg       Palliative Assessment/Data: PPS: 40%    Flowsheet Rows     Most Recent Value  Intake Tab  Referral Department Hospitalist  Unit at Time of Referral Med/Surg Unit  Palliative Care Primary Diagnosis Pulmonary  Date Notified 02/09/20  Palliative Care Type New Palliative care  Reason for referral Clarify Goals of Care  Date of Admission 02/03/20  Date first seen by Palliative Care 02/11/20  # of days Palliative referral response time 2 Day(s)  # of days IP prior to Palliative referral 6  Clinical Assessment  Psychosocial & Spiritual Assessment  Palliative Care Outcomes      Patient Active Problem List   Diagnosis Date Noted  . Community acquired pneumonia of left lower lobe of lung   . Metastatic cancer to bone ([place])   . Advanced care planning/counseling discussion   . Goals of care, counseling/discussion   . Palliative care by specialist   . Hyponatremia 02/03/2020  . Leucocytosis 02/03/2020  . Hypokalemia 02/03/2020  . Acute respiratory failure with hypoxia ([place]) 02/03/2020  . Elevated brain natriuretic peptide (BNP) level 02/03/2020  . Sepsis ([place]) 02/03/2020  . ARF (acute renal failure) ([place]) 12/09/2019  . Fall at home, initial encounter 12/09/2019  . Acute lower UTI 12/09/2019  . Rhabdomyolysis 12/09/2019  . Dehydration 12/09/2019  . Advanced age 32/25/2021  . Bilateral knee pain 12/09/2019  . Facet joint syndrome 05/13/2017  . Spinal stenosis, lumbar 09/25/2016  . Lower back pain 03/27/2016  . Excessive  daytime sleepiness 05/24/2015  . Postherpetic neuralgia 02/18/2015  . Dysesthesia 02/18/2015  . Insomnia 02/18/2015  . Snoring 02/18/2015    Palliative Care Assessment & Plan   Patient Profile: 79 y.o. female  with past medical history of CKD, spinal stenosis, HTN, admission in August of this year for acute kidney injury in the setting of UTI and traumatic rhabdomyolysis from when she was discharged to nursing facility and successfully rehabbed and was able to discharge home.  Now admitted on 02/03/2020 after a fall and weakness at home.  She was admitted and treated for sepsis pneumonia and possibly CHF exacerbation.  She had an echo that showed 65 to 70% ejection fraction and Grade 1 diastolic dysfunction. Admission and recovery have been complicated by incidental findings on pelvic xray of sclerotic bone lesions. Follow up nuclear bone scan shows diffuse uptake in multiple bones consistent with bony metastasis. Workup thus far does not reveal primary. Oncology has been consulted. Bone marrow biopsy planned for tomorrow (10/29). She was originally planned to discharge to SNF however insurance has declined SNF admission due to they feel she is custodial and not rehabable. Palliative medicine consulted for goals of care.    Assessment/Recommendations/Plan  Plan for d/c home MOST completed and on file in [facility] referral for outpatient Palliative to follow along with home health services  Goals of Care and Additional Recommendations: Limitations on Scope of Treatment: Full Scope Treatment for now- however, if she experienced decline or some life threatening event- she would prefer comfort measures only  Code Status: DNR  Prognosis:  Unable to determine  Discharge Planning: To Be Determined  Care plan was discussed with patient.  Thank you for allowing the Palliative Medicine Team to assist in the care of this patient.   Total time: 28 mins  Greater than 50%  of this time was  spent counseling and coordinating care  related to the above assessment and plan.  Mariana Kaufman, AGNP-C Palliative Medicine   Please contact Palliative Medicine Team phone at [phone] for questions and concerns.

## 2020-02-17 NOTE — Progress Notes (Deleted)
No chief complaint on file.    HISTORY OF PRESENT ILLNESS: Today 02/17/20  Kathleen Schneider is a 79 y.o. female here today for follow up for post herpetic neuralgia.    HISTORY (copied from Dr Garth Bigness note on 11/13/2018)  Kathleen Schneider is a 79 year old woman with post herpetic neuralgia and insomnia  Update 11/13/2018: She is reporting that the postherpetic nerve is doing well.  She is on lamotrigine 200 mg po bid and tolerates it well.    She also wears the Lidoderm patches with benefit.  She haas no skin changes.  Pain is in the right flank/abdomen.   She also reports LBP.   Meloxicam has helped.    She felt bette when she was doing PT and more exercises.    We discussed continuing the exercises.     She has facet hypertrophy at L4L5 on her MRI.     Her urinary urgency is better with oxybutynin and she has no recent incontinence.      Update 11/11/2017: She feels her pain is more tolerable than ear;ier this year.   She has pain from PHN in the right lower abdomen/flank.   It bothers her most when something rubs against her or she takes a shower. .   The combination of he lamotrigine and lidoderm patches has greatly helped.     She did PT or her LBP and felt it helped her some.   She was doing hte exercises more a couple moths ago but has been more lax and is noting more pain.    She wakes up several times a night due to urinary urgency.    She is now interested to try a medication.   Update 05/13/2017:  Her PHN is much better and she continues Lamictal and Lidoderm.   However, she is experiencing pain in the lower back and down the leg.   Her lower back pain worsened more the past couple months.    ASA helps the LBP pain some.    I had referred her for facet (medial branch) blocks and RFA due to facet hypertrophy/degeneration at L4L5 but insurance is insisting that she do PT first.     She notes her LBP is worse if standing a while and better if she sits or bends forward.      She is on meloxicam 7.5 mg but feels some the benefit is short lived (2-4 hours)  From 09/25/2016: PHN Dysesthesia:  She feels her PHN pain (in the right lower thoracic region) is doing bette on lamotrigine and Lidoderm.  She stopped gabapentin 300.  She is also on Lidoderm .  She has allodynia/hyperpathia triggered by clothes at times in that region.  This is also doing better.  PHN / Dysesthesia history from initial visit:   In mid 2014, she had the onset of lower thoracic pain on the right.   She saw her PCP 2 weeks later and was told that she had also had evidence of a recent rash in that distribution.   Pain has been persistent.   She notes that touch feels like gait burning prickly pain.    Clothes rubbing against her lower abdomen/flank increases the pain.   Gabapentin helped but made her sleepy.     Lamotrigine was added with benefit.     LBP:   LBP is worsening and now occurring daily.   While resting LBP is mild but it can intensify if she stands a while or with  walking.   Pain is axial without radiation into legs. She finds that she stoops more when she walks and using a shopping cart helps.    She has never had any imaging.   Meloxicam helps slightly  Insomnia:  She often has insomnia, especially if she takes a nap (which is common).    She also has nocturia x 3.   She often wakes up to urinate and has trouble falling back asleep once awake.  She has mixed urinary hesitancy and frequency     She has been told that she snores  She occasionally wakes up catching her breath  on rare occasions.     Her Epworth score is 16/24  (moderate sleepiness).   She is not interested in a PSG and would not wear CPAP or an oral appliance.  Mood:   She has noted mild depression but this has not worsened.      REVIEW OF SYSTEMS: Out of a complete 14 system review of symptoms, the patient complains only of the following symptoms, and all other reviewed systems are negative.   ALLERGIES: No Known  Allergies   HOME MEDICATIONS: Facility-Administered Medications Prior to Visit  Medication Dose Route Frequency Provider Last Rate Last Admin  . 0.9 %  sodium chloride infusion   Intravenous PRN Georgette Shell, MD      . 0.9 %  sodium chloride infusion   Intravenous Continuous Kayleen Memos, DO 30 mL/hr at 02/16/20 0850 New Bag at 02/16/20 0850  . acetaminophen (TYLENOL) tablet 500 mg  500 mg Oral Q6H PRN Georgette Shell, MD   500 mg at 02/06/20 1003  . enoxaparin (LOVENOX) injection 30 mg  30 mg Subcutaneous Q24H Georgette Shell, MD   30 mg at 02/16/20 1152  . escitalopram (LEXAPRO) tablet 10 mg  10 mg Oral Daily Starkes-Perry, Takia S, FNP      . ipratropium-albuterol (DUONEB) 0.5-2.5 (3) MG/3ML nebulizer solution 3 mL  3 mL Nebulization Q6H PRN Georgette Shell, MD   3 mL at 02/07/20 0820  . lamoTRIgine (LAMICTAL) tablet 150 mg  150 mg Oral BID Georgette Shell, MD   150 mg at 02/16/20 2115  . lidocaine (LIDODERM) 5 % 2 patch  2 patch Transdermal Q24H Lang Snow, FNP   2 patch at 02/17/20 [phone]  . ondansetron (ZOFRAN) injection 4 mg  4 mg Intravenous Q6H PRN Patrecia Pour, MD   4 mg at 02/11/20 1040  . oxybutynin (DITROPAN) tablet 5 mg  5 mg Oral BID Georgette Shell, MD   5 mg at 02/16/20 2116   Outpatient Medications Prior to Visit  Medication Sig Dispense Refill  . collagenase (SANTYL) ointment Apply topically daily. (Patient not taking: Reported on 02/03/2020) 15 g 0  . escitalopram (LEXAPRO) 5 MG tablet Take 5 mg by mouth daily.     Marland Kitchen lamoTRIgine (LAMICTAL) 150 MG tablet TAKE 1 TABLET BY MOUTH TWICE A DAY (Patient taking differently: Take 150 mg by mouth 2 (two) times daily. ) 60 tablet 1  . lidocaine (LIDODERM) 5 % Place 1 patch onto the skin daily. Remove & Discard patch within 12 hours or as directed by MD 30 patch 0  . meloxicam (MOBIC) 7.5 MG tablet Take 7.5 mg by mouth daily.    Marland Kitchen oxybutynin (DITROPAN) 5 MG tablet TAKE 1 TABLET BY MOUTH TWICE A  DAY (Patient taking differently: Take 5 mg by mouth 2 (two) times daily. ) 180 tablet 0  PAST MEDICAL HISTORY: Past Medical History:  Diagnosis Date  . HTN (hypertension)   . Vision abnormalities      PAST SURGICAL HISTORY: Past Surgical History:  Procedure Laterality Date  . CHOLECYSTECTOMY, LAPAROSCOPIC       FAMILY HISTORY: Family History  Problem Relation Age of Onset  . Seizures Mother   . Alcoholism Father      SOCIAL HISTORY: Social History   Socioeconomic History  . Marital status: Married    Spouse name: Not on file  . Number of children: Not on file  . Years of education: Not on file  . Highest education level: Not on file  Occupational History  . Not on file  Tobacco Use  . Smoking status: Former Research scientist (life sciences)  . Smokeless tobacco: Never Used  Substance and Sexual Activity  . Alcohol use: Yes    Alcohol/week: 0.0 standard drinks    Comment: Rare--about twice per yr/fim  . Drug use: No  . Sexual activity: Not on file  Other Topics Concern  . Not on file  Social History Narrative  . Not on file   Social Determinants of Health   Financial Resource Strain:   . Difficulty of Paying Living Expenses: Not on file  Food Insecurity:   . Worried About Charity fundraiser in the Last Year: Not on file  . Ran Out of Food in the Last Year: Not on file  Transportation Needs:   . Lack of Transportation (Medical): Not on file  . Lack of Transportation (Non-Medical): Not on file  Physical Activity:   . Days of Exercise per Week: Not on file  . Minutes of Exercise per Session: Not on file  Stress:   . Feeling of Stress : Not on file  Social Connections:   . Frequency of Communication with Friends and Family: Not on file  . Frequency of Social Gatherings with Friends and Family: Not on file  . Attends Religious Services: Not on file  . Active Member of Clubs or Organizations: Not on file  . Attends Archivist Meetings: Not on file  . Marital  Status: Not on file  Intimate Partner Violence:   . Fear of Current or Ex-Partner: Not on file  . Emotionally Abused: Not on file  . Physically Abused: Not on file  . Sexually Abused: Not on file      PHYSICAL EXAM  There were no vitals filed for this visit. There is no height or weight on file to calculate BMI.   Generalized: Well developed, in no acute distress   Neurological examination  Mentation: Alert oriented to time, place, history taking. Follows all commands speech and language fluent Cranial nerve II-XII: Pupils were equal round reactive to light. Extraocular movements were full, visual field were full on confrontational test. Facial sensation and strength were normal. Uvula tongue midline. Head turning and shoulder shrug  were normal and symmetric. Motor: The motor testing reveals 5 over 5 strength of all 4 extremities. Good symmetric motor tone is noted throughout.  Sensory: Sensory testing is intact to soft touch on all 4 extremities. No evidence of extinction is noted.  Coordination: Cerebellar testing reveals good finger-nose-finger and heel-to-shin bilaterally.  Gait and station: Gait is normal. Tandem gait is normal. Romberg is negative. No drift is seen.  Reflexes: Deep tendon reflexes are symmetric and normal bilaterally.     DIAGNOSTIC DATA (LABS, IMAGING, TESTING) - I reviewed patient records, labs, notes, testing and imaging myself where available.  Lab Results  Component Value Date   WBC 6.2 02/17/2020   HGB 9.0 (L) 02/17/2020   HCT 30.0 (L) 02/17/2020   MCV 94.0 02/17/2020   PLT 449 (H) 02/17/2020      Component Value Date/Time   NA 135 02/17/2020 0548   K 4.1 02/17/2020 0548   CL 96 (L) 02/17/2020 0548   CO2 32 02/17/2020 0548   GLUCOSE 104 (H) 02/17/2020 0548   BUN 12 02/17/2020 0548   CREATININE 1.34 (H) 02/17/2020 0548   CALCIUM 8.9 02/17/2020 0548   PROT 6.3 (L) 02/08/2020 0418   ALBUMIN 2.5 (L) 02/08/2020 0418   AST 14 (L) 02/08/2020  0418   ALT 17 02/08/2020 0418   ALKPHOS 111 02/08/2020 0418   BILITOT 0.5 02/08/2020 0418   GFRNONAA 40 (L) 02/17/2020 0548   GFRAA 33 (L) 12/11/2019 0538   No results found for: CHOL, HDL, LDLCALC, LDLDIRECT, TRIG, CHOLHDL No results found for: HGBA1C Lab Results  Component Value Date   GHWEXHBZ16 967 02/11/2020   No results found for: TSH    ASSESSMENT AND PLAN  79 y.o. year old female  has a past medical history of HTN (hypertension) and Vision abnormalities. here with ***  No diagnosis found.   I spent 20 minutes of face-to-face and non-face-to-face time with patient.  This included previsit chart review, lab review, study review, order entry, electronic health record documentation, patient education.    Debbora Presto, MSN, FNP-C 02/17/2020, 7:38 AM  [facility] [address] [place], [place] [address] [phone]

## 2020-02-17 NOTE — Progress Notes (Signed)
Kathleen Schneider  JSE:[id number] DOB: January 05, 1941 DOA: 02/03/2020 PCP: Donald Prose, MD    Brief Narrative:  78 year old with a history of HTN, recurrent UTIs, CKD stage III, excessive daytime somnolence, gait abnormality, and low visual acuity who presented from home with shortness of breath and generalized weakness progressive over a 3 to 4-day period. In the ED she was found to be hypoxic and required 2 L of oxygen support. She was found to be Covid negative but CXR did suggest a pneumonia versus pulmonary edema.  Following admission she was treated for community-acquired pneumonia and acute kidney injury. 10/24 an x-ray suggested a possible sclerotic bone lesion of the hip. 10/27 a bone scan suggested diffuse metastatic lesions. This led to an aspirin and bone marrow biopsy performed at the right iliac crest per Dr. Kathlene Cote 10/29 which ultimately revealed metastatic carcinoma. Oncology was consulted. This was felt to be metastatic breast cancer. Given her poor performance she is not felt to be a candidate for chemotherapy but will follow-up for oncology to consider possible antiestrogen therapy.  Attempts were made to place the patient within a SNF as this was felt to be most appropriate but her insurance denied this. The plan at this time is for her to return home with as much home health assistance as can possibly be arranged.  Significant Events:  10/20 admit via ED   DVT prophylaxis: Lovenox  Subjective: Resting comfortably in bed.  Has no new complaints.  Is looking forward to going home soon.  Denies current chest pain shortness of breath or abdominal pain.  Assessment & Plan:  Left basilar community-acquired pneumonia with sepsis present on admission -acute hypoxic respiratory failure Now off antibiotic therapy -attempt to wean to room air  Newly diagnosed metastatic breast cancer -multiple bony metastatic lesions Confirmed via bone marrow aspirate and biopsy 10/29 in IR -has been  evaluated by oncology -to follow-up with oncology as an outpatient  Depression - acute episodic Understandable in setting of newly diagnosed metastatic breast CA - Lexapro increased per Psych   Acute kidney injury on CKD stage IIIb Acute component felt to be prerenal due to dehydration in setting of acute bacterial infection - baseline crt ~1.0 (GFR 35) -creatinine steadily improving  Multifactorial Anemia As per Onc "due to recent infection and anemia of chronic kidney disease - bone marrow infiltration from malignancy can also cause her anemia - no evidence of iron deficiency anemia or B12 deficiency"  Hyponatremia Hypovolemic in nature -corrected with volume resuscitation  Generalized weakness -physical debility PT/OT recommended SNF which the medical team agreed with but the patient's insurance company refused -forced to discharge home with maximal home health assistance  Right greater than left hydronephrosis Urology evaluated -no indication for intervention at present  Tree-in-bud opacities on CT chest Pulmonary evaluated -plan is to repeat CT scan in 3 months  Chronic diastolic CHF  EF 07-37% via TTE October 2021  Mildly elevated troponin -demand ischemia Asymptomatic  Obesity - Body mass index is 36.25 kg/m.   Code Status: NO CODE BLUE Family Communication:  Status is: Inpatient  Remains inpatient appropriate because:Unsafe d/c plan   Dispo:  Patient From: Home  Planned Disposition: Home with Health Care Svc  Expected discharge date: 02/18/20  Medically stable for discharge: Yes   Consultants:  Oncology Pulmonary Urology Psychiatry Palliative care  Objective: Blood pressure (!) 118/59, pulse 61, temperature 97.7 F (36.5 C), temperature source Oral, resp. rate 15, height 5' (1.524 m), weight 84.2 kg, SpO2  98 %.  Intake/Output Summary (Last 24 hours) at 02/17/2020 0927 Last data filed at 02/16/2020 2113 Gross per 24 hour  Intake 420.7 ml  Output 950  ml  Net -529.3 ml   Filed Weights   02/04/20 1300  Weight: 84.2 kg    Examination: General: No acute respiratory distress Lungs: Clear to auscultation bilaterally without wheezes or crackles Cardiovascular: Regular rate and rhythm without murmur gallop or rub normal S1 and S2 Abdomen: Nontender, nondistended, soft, bowel sounds positive, no rebound, no ascites, no appreciable mass Extremities: No significant cyanosis, clubbing, or edema bilateral lower extremities  CBC: Recent Labs  Lab 02/12/20 0315 02/12/20 0315 02/13/20 0342 02/15/20 0331 02/17/20 0548  WBC 9.0   < > 9.1 6.7 6.2  NEUTROABS 6.8  --   --   --   --   HGB 9.0*   < > 9.2* 8.7* 9.0*  HCT 30.0*   < > 30.3* 28.8* 30.0*  MCV 94.0   < > 93.5 94.1 94.0  PLT 316   < > 345 391 449*   < > = values in this interval not displayed.   Basic Metabolic Panel: Recent Labs  Lab 02/13/20 0342 02/15/20 0331 02/17/20 0548  NA 132* 135 135  K 4.3 4.4 4.1  CL 93* 98 96*  CO2 31 31 32  GLUCOSE 107* 131* 104*  BUN 20 13 12   CREATININE 1.76* 1.44* 1.34*  CALCIUM 8.4* 8.5* 8.9   GFR: Estimated Creatinine Clearance: 32.2 mL/min (A) (by C-G formula based on SCr of 1.34 mg/dL (H)).   Recent Results (from the past 240 hour(s))  SARS Coronavirus 2 by RT PCR (hospital order, performed in [facility] hospital lab) Nasopharyngeal Nasopharyngeal Swab     Status: None   Collection Time: 02/08/20  4:25 PM   Specimen: Nasopharyngeal Swab  Result Value Ref Range Status   SARS Coronavirus 2 NEGATIVE NEGATIVE Final    Comment: (NOTE) SARS-CoV-2 target nucleic acids are NOT DETECTED.  The SARS-CoV-2 RNA is generally detectable in upper and lower respiratory specimens during the acute phase of infection. The lowest concentration of SARS-CoV-2 viral copies this assay can detect is 250 copies / mL. A negative result does not preclude SARS-CoV-2 infection and should not be used as the sole basis for treatment or other patient  management decisions.  A negative result may occur with improper specimen collection / handling, submission of specimen other than nasopharyngeal swab, presence of viral mutation(s) within the areas targeted by this assay, and inadequate number of viral copies (<250 copies / mL). A negative result must be combined with clinical observations, patient history, and epidemiological information.  Fact Sheet for Patients:   StrictlyIdeas.no  Fact Sheet for Healthcare Providers: BankingDealers.co.za  This test is not yet approved or  cleared by the [place] FDA and has been authorized for detection and/or diagnosis of SARS-CoV-2 by FDA under an Emergency Use Authorization (EUA).  This EUA will remain in effect (meaning this test can be used) for the duration of the COVID-19 declaration under Section 564(b)(1) of the Act, 21 U.S.C. section 360bbb-3(b)(1), unless the authorization is terminated or revoked sooner.  Performed at [facility], [place] [address], [place], [place] [address]      Scheduled Meds: . enoxaparin (LOVENOX) injection  30 mg Subcutaneous Q24H  . escitalopram  10 mg Oral Daily  . lamoTRIgine  150 mg Oral BID  . lidocaine  2 patch Transdermal Q24H  . oxybutynin  5 mg  Oral BID   Continuous Infusions: . sodium chloride    . sodium chloride 30 mL/hr at 02/16/20 0850     LOS: 14 days   Cherene Altes, MD [facility] Office  [phone] Pager - Text Page per Amion  If 7PM-7AM, please contact night-coverage per Amion 02/17/2020, 9:27 AM

## 2020-02-18 MED ORDER — LAMOTRIGINE 150 MG PO TABS: 150.0000 mg | ORAL_TABLET | Freq: Two times a day (BID) | ORAL | 1 refills | Status: AC

## 2020-02-18 MED ORDER — MELOXICAM 7.5 MG PO TABS: 7.5000 mg | ORAL_TABLET | Freq: Every day | ORAL | 1 refills | Status: AC | PRN

## 2020-02-18 MED ORDER — ACETAMINOPHEN 500 MG PO TABS: 500.0000 mg | ORAL_TABLET | Freq: Four times a day (QID) | ORAL | 0 refills | Status: AC | PRN

## 2020-02-18 MED ORDER — LIDOCAINE 5 % EX PTCH: 2.0000 | MEDICATED_PATCH | CUTANEOUS | 1 refills | Status: AC

## 2020-02-18 MED ORDER — OXYBUTYNIN CHLORIDE 5 MG PO TABS: 5.0000 mg | ORAL_TABLET | Freq: Two times a day (BID) | ORAL | 1 refills | Status: AC

## 2020-02-18 MED ORDER — ESCITALOPRAM OXALATE 10 MG PO TABS: 10.0000 mg | ORAL_TABLET | Freq: Every day | ORAL | 1 refills | Status: AC

## 2020-02-18 NOTE — Discharge Summary (Signed)
DISCHARGE SUMMARY  Kathleen Schneider  MR#: [id number]  DOB:22-Aug-1940  Date of Admission: 02/03/2020 Date of Discharge: 02/18/2020  Attending Physician:Ronnell Makarewicz Hennie Duos, MD  Patient's PCP:Sun, Gari Crown, MD  Consults: Oncology Pulmonary Urology Psychiatry Palliative Care  Disposition: D/C Home (Insurance refused SNF)   Follow-up Appts:  Contact information for follow-up providers    Donald Prose, MD Follow up in 1 week(s).   Specialty: Family Medicine Contact information: [address] [place] [address] [place] Follow up.   Why: This is the home health agency that will be working with you.  They are not with [facility] is separate-with [facility] Contact information: [address], [place], [place] [address]  Phone: [phone]           Contact information for after-discharge care    Destination    [facility] Preferred SNF .   Service: Skilled Nursing Contact information: [place] [phone]                  Tests Needing Follow-up: -consider repeat CT chest in 3 months to re-evaluate "tree-in-bud opacities" noted during this admit -to f/u w/ Onc to consider anti-estrogen tx as outpt   Discharge Diagnoses: Left basilar community-acquired pneumonia sepsis present on admission acute hypoxic respiratory failure Newly diagnosed metastatic breast cancer -multiple bony metastatic lesions Depression - acute episodic Acute kidney injury on CKD stage IIIb Multifactorial Anemia Hyponatremia Generalized weakness -physical debility Right greater than left hydronephrosis Tree-in-bud opacities on CT chest Chronic diastolic CHF  Mildly elevated troponin -demand ischemia Obesity - Body mass index is 36.25 kg/m.   Initial presentation: 79 year old with a history of HTN, recurrent UTIs, CKD stage III, excessive daytime somnolence, gait abnormality,  and low visual acuity who presented from home with shortness of breath and generalized weakness progressive over a 3 to 4 day period. In the ED she was found to be hypoxic and required 2 L of oxygen support. She was found to be Covid negative but CXR did suggest a pneumonia versus pulmonary edema.  Hospital Course: Following admission she was treated for community-acquired pneumonia and acute kidney injury. 10/24 an x-ray suggested a possible sclerotic bone lesion of the hip. 10/27 a bone scan suggested diffuse metastatic lesions. This led to a bone marrow biopsy performed at the right iliac crest per Dr. Kathlene Cote 10/29 which ultimately revealed metastatic carcinoma. Oncology was consulted. This was felt to be metastatic breast cancer. Given her poor performance she is not felt to be a candidate for chemotherapy but will follow-up with Oncology to consider possible antiestrogen therapy as an outpt.   Attempts were made to place the patient within a SNF as this was felt to be most appropriate but her insurance denied this. The plan at this time is for her to return home with as much home health assistance as can possibly be arranged.  Left basilar community-acquired pneumonia with sepsis present on admission -acute hypoxic respiratory failure Now off antibiotic therapy - weaned to room air prior to d/c home - completed a course of empiric abx tx   Newly diagnosed metastatic breast cancer -multiple bony metastatic lesions Confirmed via bone marrow aspirate and biopsy 10/29 in IR -has been evaluated by Oncology -to follow-up with Cncology as an outpatient  Depression - acute episodic Understandable in setting of newly diagnosed metastatic breast CA - Lexapro increased per Psych and pt tolerating at  time of d/c   Acute kidney injury on CKD stage IIIb Acute component felt to be prerenal due to dehydration in setting of acute bacterial infection - baseline crt ~1.0 (GFR 35) -creatinine steadily  improving - at time of d/c crt 1.34  Multifactorial Anemia As per Onc "due to recent infection and anemia of chronic kidney disease - bone marrow infiltration from malignancy can also cause her anemia - no evidence of iron deficiency anemia or B12 deficiency"  Hyponatremia Hypovolemic in nature -corrected with volume resuscitation  Generalized weakness -physical debility PT/OT recommended SNF which the medical team agreed with but the patient's insurance company refused -forced to discharge home with maximal home health assistance  Right greater than left hydronephrosis Urology evaluated -no indication for intervention at present  Tree-in-bud opacities on CT chest Pulmonary evaluated -plan is to repeat CT scan in 3 months   Chronic diastolic CHF  EF 32-91% via TTE October 2021  Mildly elevated troponin -demand ischemia Asymptomatic  Obesity - Body mass index is 36.25 kg/m.    Allergies as of 02/18/2020   No Known Allergies     Medication List    STOP taking these medications   collagenase ointment Commonly known as: SANTYL     TAKE these medications   acetaminophen 500 MG tablet Commonly known as: TYLENOL Take 1 tablet (500 mg total) by mouth every 6 (six) hours as needed for mild pain, fever or headache.   escitalopram 10 MG tablet Commonly known as: LEXAPRO Take 1 tablet (10 mg total) by mouth daily. Start taking on: February 19, 2020 What changed:   medication strength  how much to take   lamoTRIgine 150 MG tablet Commonly known as: LAMICTAL Take 1 tablet (150 mg total) by mouth 2 (two) times daily.   lidocaine 5 % Commonly known as: LIDODERM Place 2 patches onto the skin daily. Remove & Discard patch within 12 hours or as directed by MD Start taking on: February 19, 2020 What changed: how much to take   meloxicam 7.5 MG tablet Commonly known as: MOBIC Take 1 tablet (7.5 mg total) by mouth daily as needed for pain. What changed:   when to  take this  reasons to take this   oxybutynin 5 MG tablet Commonly known as: DITROPAN Take 1 tablet (5 mg total) by mouth 2 (two) times daily.            Durable Medical Equipment  (From admission, onward)         Start     Ordered   02/16/20 1303  For home use only DME 3 n 1  Once        02/16/20 1302          Day of Discharge BP (!) 130/57 (BP Location: Right Arm)   Pulse 65   Temp 98 F (36.7 C) (Oral)   Resp 15   Ht 5' (1.524 m)   Wt 84.2 kg   SpO2 97%   BMI 36.25 kg/m   Physical Exam: General: No acute respiratory distress Lungs: Clear to auscultation bilaterally without wheezes or crackles Cardiovascular: Regular rate and rhythm without murmur gallop or rub normal S1 and S2 Abdomen: Nontender, nondistended, soft, bowel sounds positive, no rebound, no ascites, no appreciable mass Extremities: No significant cyanosis, clubbing, or edema bilateral lower extremities  Basic Metabolic Panel: Recent Labs  Lab 02/12/20 0315 02/13/20 0342 02/15/20 0331 02/17/20 0548  NA 133* 132* 135 135  K 4.9 4.3 4.4 4.1  CL 92* 93* 98 96*  CO2 32 31 31 32  GLUCOSE 89 107* 131* 104*  BUN 16 20 13 12   CREATININE 1.50* 1.76* 1.44* 1.34*  CALCIUM 8.6* 8.4* 8.5* 8.9    CBC: Recent Labs  Lab 02/12/20 0315 02/13/20 0342 02/15/20 0331 02/17/20 0548  WBC 9.0 9.1 6.7 6.2  NEUTROABS 6.8  --   --   --   HGB 9.0* 9.2* 8.7* 9.0*  HCT 30.0* 30.3* 28.8* 30.0*  MCV 94.0 93.5 94.1 94.0  PLT 316 345 391 449*    BNP (last 3 results) Recent Labs    02/03/20 1812 02/04/20 2030  BNP 382.3* 557.5*     Recent Results (from the past 240 hour(s))  SARS Coronavirus 2 by RT PCR (hospital order, performed in [facility] hospital lab) Nasopharyngeal Nasopharyngeal Swab     Status: None   Collection Time: 02/08/20  4:25 PM   Specimen: Nasopharyngeal Swab  Result Value Ref Range Status   SARS Coronavirus 2 NEGATIVE NEGATIVE Final    Comment: (NOTE) SARS-CoV-2 target nucleic  acids are NOT DETECTED.  The SARS-CoV-2 RNA is generally detectable in upper and lower respiratory specimens during the acute phase of infection. The lowest concentration of SARS-CoV-2 viral copies this assay can detect is 250 copies / mL. A negative result does not preclude SARS-CoV-2 infection and should not be used as the sole basis for treatment or other patient management decisions.  A negative result may occur with improper specimen collection / handling, submission of specimen other than nasopharyngeal swab, presence of viral mutation(s) within the areas targeted by this assay, and inadequate number of viral copies (<250 copies / mL). A negative result must be combined with clinical observations, patient history, and epidemiological information.  Fact Sheet for Patients:   StrictlyIdeas.no  Fact Sheet for Healthcare Providers: BankingDealers.co.za  This test is not yet approved or  cleared by the [place] FDA and has been authorized for detection and/or diagnosis of SARS-CoV-2 by FDA under an Emergency Use Authorization (EUA).  This EUA will remain in effect (meaning this test can be used) for the duration of the COVID-19 declaration under Section 564(b)(1) of the Act, 21 U.S.C. section 360bbb-3(b)(1), unless the authorization is terminated or revoked sooner.  Performed at [facility], [place] [address], [place], [place] [address]       Time spent in discharge (includes decision making & examination of pt): 35 minutes  02/18/2020, 11:14 AM   Cherene Altes, MD [facility] Office  [phone]

## 2020-02-18 NOTE — TOC Transition Note (Signed)
Transition of Care [facility]) - CM/SW Discharge Note   Patient Details  Name: Kathleen Schneider MRN: [id number] Date of Birth: [birth year]  Transition of Care [facility] CM/SW Contact:  Trish Mage, LCSW Phone Number: 02/18/2020, 1:23 PM   Clinical Narrative:  Patient to d/c home today.  Coordinated home-coming with [place], friend [name], and alerted oncology of need for medical transport to future appointments.  PTAR arranged for pick up at 3:15.  No further needs identified.  TOC sign off.     Final next level of care: [place] Barriers to Discharge: Barriers Resolved   Patient Goals and CMS Choice     Choice offered to / list presented to : Patient  Discharge Placement                       Discharge Plan and Services   Discharge Planning Services: CM Consult Post Acute Care Choice: [place]                               Social Determinants of Health (SDOH) Interventions     Readmission Risk Interventions Readmission Risk Prevention Plan 12/10/2019  Medication Screening Complete  Transportation Screening Complete  Some recent data might be hidden

## 2020-02-18 NOTE — Progress Notes (Signed)
Occupational Therapy Treatment Patient Details Name: Kathleen Schneider MRN: [id number] DOB: Aug 22, 1940 Today's Date: 02/18/2020    History of present illness 79 yo female admitted with Pna, Sepsis after falling at home. Hx of CKD, gait abnormality, visual deficits, spinal stenosis. S/p RT iliac bone biopsy on 02/12/20 and results consistent with metastatic breast carcinoma.   OT comments  Patient exhibiting improved mobility and ability to ambulate to bathroom today. Min assist and +2 for safety. Patient max assist for toileting. Continues to be emotional during treatment - which can be limiting.   Follow Up Recommendations  SNF;Other (comment)    Equipment Recommendations  3 in 1 bedside commode    Recommendations for Other Services      Precautions / Restrictions Precautions Precautions: Fall Precaution Comments: monitor O2 Restrictions Weight Bearing Restrictions: No       Mobility Bed Mobility Overal bed mobility: Needs Assistance Bed Mobility: Supine to Sit;Sit to Supine     Supine to sit: Min guard;HOB elevated Sit to supine: Min guard;HOB elevated   General bed mobility comments: increased time and use of bed rail.  Transfers Overall transfer level: Needs assistance Equipment used: Rolling walker (2 wheeled) Transfers: Sit to/from Stand Sit to Stand: Min guard;Min assist;+2 safety/equipment         General transfer comment: Pt was min guard stand from bed and min A stand from toielt x 2; cues for safe hand placement    Balance Overall balance assessment: Needs assistance Sitting-balance support: Feet supported;No upper extremity supported Sitting balance-[name]: Good     Standing balance support: Bilateral upper extremity supported;During functional activity Standing balance-[name]: Poor Standing balance comment: required RW                           ADL either performed or assessed with clinical judgement   ADL        Grooming: Wash/dry face;Brushing hair;Sitting Grooming Details (indicate cue type and reason): Performed grooming sitting at side of bed.                 Toilet Transfer: Regular Toilet;RW;Grab bars;Minimal assistance Toilet Transfer Details (indicate cue type and reason): Ambulated to bathroom with min assist and +2 for safety. Patient exhibiting pursed lipped breathing with ambulation. sat 91% when reached toilet. Min assist for toilet transfer with verbal cues for hand placement. Performed sit to stand x 2 as patient became emotional and sat back down on toilet. Toileting- Clothing Manipulation and Hygiene: Maximal assistance;Sit to/from stand Toileting - Clothing Manipulation Details (indicate cue type and reason): Patient able to wipe herself in seated position but max assist for clothing management.             Vision Patient Visual Report: No change from baseline Vision Assessment?: No apparent visual deficits   Perception     Praxis      Cognition Arousal/Alertness: Awake/alert Behavior During Therapy: Anxious Overall Cognitive Status: Within Functional Limits for tasks assessed Area of Impairment: Problem solving                             Problem Solving: Slow processing;Decreased initiation General Comments: Tearful during treatment.        Exercises     Shoulder Instructions       General Comments      Pertinent Vitals/ Pain       Pain Assessment: No/denies pain  Home Living                                          Prior Functioning/Environment              Frequency  Min 2X/week        Progress Toward Goals  OT Goals(current goals can now be found in the care plan section)  Progress towards OT goals: Progressing toward goals  Acute Rehab OT Goals Patient Stated Goal: to regain independence and PLOF OT Goal Formulation: With patient Time For Goal Achievement: 02/20/20 Potential to Achieve Goals:  [place] Discharge plan remains appropriate    Co-evaluation    PT/OT/SLP Co-Evaluation/Treatment: Yes Reason for Co-Treatment: For patient/therapist safety;To address functional/ADL transfers PT goals addressed during session: Mobility/safety with mobility OT goals addressed during session: ADL's and self-care      AM-PAC OT "6 Clicks" Daily Activity     Outcome Measure   Help from another person eating meals?: A Little Help from another person taking care of personal grooming?: A Little Help from another person toileting, which includes using toliet, bedpan, or urinal?: A Lot Help from another person bathing (including washing, rinsing, drying)?: A Lot Help from another person to put on and taking off regular upper body clothing?: A Little Help from another person to put on and taking off regular lower body clothing?: A Lot 6 Click Score: 15    End of Session Equipment Utilized During Treatment: Gait belt;Rolling walker  OT Visit Diagnosis: Unsteadiness on feet (R26.81);Muscle weakness (generalized) (M62.81);History of falling (Z91.81)   Activity Tolerance Patient tolerated treatment well   Patient Left in bed;with call bell/phone within reach;with bed alarm set   Nurse Communication Mobility status        Time: 1340-1402 OT Time Calculation (min): 22 min  Charges: OT General Charges $OT Visit: 1 Visit OT Treatments $Self Care/Home Management : 8-22 mins  Derl Barrow, OTR/L [place]  Office [phone] Pager: [place] 02/18/2020, 4:45 PM

## 2020-02-18 NOTE — Progress Notes (Signed)
Physical Therapy Treatment Patient Details Name: Kathleen Schneider MRN: [id number] DOB: [birth year] Today's Date: 02/18/2020    History of Present Illness 79 yo female admitted with Pna, Sepsis after falling at home. Hx of CKD, gait abnormality, visual deficits, spinal stenosis. S/p RT iliac bone biopsy on 02/12/20 and results consistent with metastatic breast carcinoma.    PT Comments    Pt demonstrating improved transfers and gait today.  She required min A for standing and ambulated 15'x2 with chair follow.  Pt needs cues for transfer techniques and RW proximity. Fatigues easily.  Would benefit from SNF but not covered by insurance.  If home then will need max HH services and 24 hr supervision.    Follow Up Recommendations  SNF (Pt needs SNF but denied by insurance.  Recommend max HH services and 24 hr supervision if has to go home)     Financial risk analyst cushion (measurements PT);Wheelchair (measurements PT)    Recommendations for Other Services       Precautions / Restrictions Precautions Precautions: Fall Precaution Comments: monitor O2    Mobility  Bed Mobility Overal bed mobility: Needs Assistance Bed Mobility: Supine to Sit;Sit to Supine     Supine to sit: Min guard;HOB elevated Sit to supine: Min guard;HOB elevated      Transfers Overall transfer level: Needs assistance Equipment used: Rolling walker (2 wheeled) Transfers: Sit to/from Stand Sit to Stand: Min guard;Min assist;+2 safety/equipment         General transfer comment: Pt was min guard stand from bed and min A stand from toielt x 2; cues for safe hand placement  Ambulation/Gait Ambulation/Gait assistance: Min assist;+2 safety/equipment Gait Distance (Feet): 15 Feet (15'x2) Assistive device: Rolling walker (2 wheeled) Gait Pattern/deviations: Step-to pattern;Trunk flexed     General Gait Details: Ambulated to and from restroom; fatigued easily, chair follow, cues for RW  proximity   Stairs             Wheelchair Mobility    Modified Rankin (Stroke Patients Only)       Balance Overall balance assessment: Needs assistance Sitting-balance support: Feet supported;No upper extremity supported Sitting balance-[name]: Good     Standing balance support: Bilateral upper extremity supported;During functional activity Standing balance-[name]: Poor Standing balance comment: required RW                            Cognition Arousal/Alertness: Awake/alert Behavior During Therapy: Anxious Overall Cognitive Status: No family/caregiver present to determine baseline cognitive functioning Area of Impairment: Problem solving                             Problem Solving: Slow processing;Decreased initiation General Comments: Patient tearful at times      Exercises      General Comments        Pertinent Vitals/Pain Pain Assessment: No/denies pain    Home Living                      Prior Function            PT Goals (current goals can now be found in the care plan section) Acute Rehab PT Goals Patient Stated Goal: to regain independence and PLOF PT Goal Formulation: With patient Time For Goal Achievement: 02/19/20 Potential to Achieve Goals: Fair Progress towards PT goals: Progressing toward goals    Frequency  Min 3X/week      PT Plan Frequency needs to be updated    Co-evaluation PT/OT/SLP Co-Evaluation/Treatment: Yes Reason for Co-Treatment: For patient/therapist safety PT goals addressed during session: Mobility/safety with mobility OT goals addressed during session: ADL's and self-care      AM-PAC PT "6 Clicks" Mobility   Outcome Measure  Help needed turning from your back to your side while in a flat bed without using bedrails?: A Little Help needed moving from lying on your back to sitting on the side of a flat bed without using bedrails?: A Little Help needed moving to  and from a bed to a chair (including a wheelchair)?: A Little Help needed standing up from a chair using your arms (e.g., wheelchair or bedside chair)?: A Little Help needed to walk in hospital room?: A Little Help needed climbing 3-5 steps with a railing? : A Lot 6 Click Score: 17    End of Session Equipment Utilized During Treatment: Gait belt Activity Tolerance: Patient limited by fatigue Patient left: with call bell/phone within reach;in bed;with bed alarm set Nurse Communication: Mobility status PT Visit Diagnosis: Muscle weakness (generalized) (M62.81);History of falling (Z91.81);Unsteadiness on feet (R26.81)     Time: 8628-2417 PT Time Calculation (min) (ACUTE ONLY): 24 min  Charges:  $Gait Training: 8-22 mins                     Abran Richard, PT Acute Rehab Services Pager [phone] [facility] [place] 02/18/2020, 2:16 PM

## 2020-02-18 NOTE — Discharge Instructions (Signed)
Community-Acquired Pneumonia, Adult Pneumonia is a type of lung infection that causes swelling in the airways of the lungs. Mucus and fluid may also build up inside the airways. This may cause coughing and difficulty breathing. There are different types of pneumonia. One type can develop while a person is in a hospital. A different type is called community-acquired pneumonia. It develops in people who are not, and have not recently been, in the hospital or another type of health care facility. What are the causes? This condition may be caused by:  Viruses. This is the most common cause of pneumonia.  Bacteria. Community-acquired pneumonia is often caused by Streptococcus pneumoniae bacteria. These bacteria are often passed from one person to another by breathing in droplets from the cough or sneeze of an infected person.  Fungi. This is the least common cause of pneumonia. What increases the risk? The following factors may make you more likely to develop this condition:  Having a chronic disease, such as chronic obstructive pulmonary disease (COPD), asthma, congestive heart failure, cystic fibrosis, diabetes, or kidney disease.  Having early-stage or late-stage HIV.  Having sickle cell disease.  Having had your spleen removed (splenectomy).  Having poor dental hygiene.  Having a medical condition that increases the risk of breathing in (aspirating) secretions from your own mouth and nose.  Having a weakened body defense system (immune system).  Being a smoker.  Traveling to areas where pneumonia-causing germs commonly exist.  Being around animal habitats or animals that have pneumonia-causing germs, including birds, bats, rabbits, cats, and farm animals. What are the signs or symptoms? Symptoms of this condition include:  A dry cough.  A wet (productive) cough.  Fever.  Sweating.  Chest pain, especially when breathing deeply or coughing.  Rapid breathing or difficulty  breathing.  Shortness of breath.  Shaking chills.  Fatigue.  Muscle aches. How is this diagnosed? This condition may be diagnosed based on:  Your medical history.  A physical exam. You may also have tests, including:  Chest X-rays.  Tests of your blood oxygen level and other blood gases.  Tests on blood, mucus (sputum), fluid around your lungs (pleural fluid), and urine. If your pneumonia is severe, other tests may be done to find the exact cause of your illness. How is this treated? Treatment for this condition depends on many factors, such as the cause of your pneumonia, the medicines you take, and other medical conditions that you have. For most adults, treatment and recovery from pneumonia may occur at home. In some cases, treatment must happen in a hospital. Treatment may include:  Medicines that are given by mouth or through an IV, including: ? Antibiotic medicines, if the pneumonia was caused by bacteria. ? Antiviral medicines, if the pneumonia was caused by a virus.  Being given extra oxygen.  Respiratory therapy. Although rare, treating severe pneumonia may include:  Using a machine to help you breathe (mechanical ventilation). This is done if you are not breathing well on your own and you cannot maintain a safe blood oxygen level.  Thoracentesis. This is a procedure to remove fluid from around one lung or both lungs to help you breathe better. Follow these instructions at home:  Medicines  Take over-the-counter and prescription medicines only as told by your health care provider. ? Only take cough medicine if you are losing sleep. Be aware that cough medicine can prevent your body's natural ability to remove mucus from your lungs.  If you were prescribed an antibiotic  medicine, take it as told by your health care provider. Do not stop taking the antibiotic even if you start to feel better. General instructions  Sleep in a semi-upright position at night. Try  sleeping in a reclining chair, or place a few pillows under your head.  Rest as needed and get at least 8 hours of sleep each night.  Drink enough water to keep your urine pale yellow. This will help to thin out mucus secretions in your lungs.  Eat a healthy diet that includes plenty of vegetables, fruits, whole grains, low-fat dairy products, and lean protein.  Do not use any products that contain nicotine or tobacco, such as cigarettes, e-cigarettes, and chewing tobacco. If you need help quitting, ask your health care provider.  Keep all follow-up visits as told by your health care provider. This is important. How is this prevented? You can lower your risk of developing community-acquired pneumonia by:  Getting a pneumococcal vaccine. There are different types and schedules of pneumococcal vaccines. Ask your health care provider which option is best for you. Consider getting the vaccine if: ? You are older than 80 years of age. ? You are older than 80 years of age and are undergoing cancer treatment, have chronic lung disease, or have other medical conditions that affect your immune system. Ask your health care provider if this applies to you.  Getting an influenza vaccine every year. Ask your health care provider which type of vaccine is best for you.  Getting regular checkups from your dentist.  Washing your hands often. If soap and water are not available, use hand sanitizer. Contact a health care provider if:  You have a fever.  You are losing sleep because you cannot control your cough with cough medicine. Get help right away if:  You have worsening shortness of breath.  You have increased chest pain.  Your sickness becomes worse, especially if you are an older adult or have a weakened immune system.  You cough up blood. Summary  Pneumonia is an infection of the lungs.  Community-acquired pneumonia develops in people who have not been in the hospital. It can be caused  by bacteria, viruses, or fungi.  This condition may be treated with antibiotics or antiviral medicines.  Severe cases may require hospitalization, mechanical ventilation, and other procedures to drain fluid from the lungs. This information is not intended to replace advice given to you by your health care provider. Make sure you discuss any questions you have with your health care provider. Document Revised: 11/28/2017 Document Reviewed: 11/28/2017 Elsevier Patient Education  2020 [place].   Bone Metastasis  Bone metastasis is cancer that has spread from the part of the body where it started to the bones. A person may have bone metastasis in one bone or in more than one bone. Cancer that spreads to the bones is different from cancer that starts in the bones (primary bone cancer). Bone metastasis is more common than primary bone cancer. The spine is the most common area for bone metastasis to occur. Other common areas include:  Hip bone (pelvis).  Ribs.  Skull.  Long bones of the arm or leg. Bone metastasis is painful. It also damages and weakens bones so that they may break easier, even from a minor injury. What are the causes? This condition is caused by cancer cells that spread to the bone. Cancer cells can spread to the rest of the body in two ways:  Through the bloodstream.  Through  the vessels that carry white blood cells in the body (lymphatic system). What increases the risk? This condition is more likely to develop in people who have an advanced type of cancer that is known to spread to bone. Cancers that often spread to bone include:  Breast cancer.  Prostate cancer.  Lung cancer.  Thyroid cancer.  Kidney cancer.  Multiple myeloma.  Lymphoma. What are the signs or symptoms? The most common symptom of this condition is bone pain, especially while you are resting. Other symptoms include:  A broken bone (fracture) that happens with little or no  trauma.  Low number of red blood cells (anemia). Bone destruction may damage the spongy tissue (bone marrow) in the center of bones where red blood cells are produced. Anemia can cause: ? Weakness. ? Shortness of breath. ? Headache. ? Dizziness.  Back or neck pain with numbness or weakness.  High levels of calcium in your blood (hypercalcemia). When bone is destroyed, calcium is released into your blood. Symptoms of hypercalcemia include: ? Constipation. ? Thirst. ? Nausea. ? Sleepiness. How is this diagnosed? This condition may be diagnosed based on:  Your symptoms and medical history. Your health care provider may suspect this condition if you are being treated for cancer or have had cancer treatment in the past.  A physical exam.  Imaging studies, such as: ? Bone X-rays, especially in the area where you have pain. ? CT scan. ? Bone scan. ? MRI. ? PET scan.  Blood tests.  Urine tests.  A procedure to remove a piece of bone so it can be examined under a microscope (biopsy). How is this treated? Treatment for this condition depends on your overall health, the type of cancer you have, and how much the cancer has spread. You will work with a team of health care providers to determine which treatment is best for you. Treatment will focus on managing pain, preventing bone weakness, and slowing the spread of the cancer. Treatment may include:  Radiation therapy. This treatment uses X-rays to kill cancer cells. It is most effective for reducing pain, stopping tumor growth, and lowering the risk of fractures.  Radioisotope therapy. This treatment uses a radioactive medicine that is injected into your blood. The medicine travels to areas where cancer cells are active and kills them.  Chemotherapy. For this treatment, you are given cancer-killing medicines. You may have chemotherapy in cycles, with rest periods in between.  Medicines that: ? Help build bone (bisphosphonates and  denosumab). These medicines are used to make bones stronger and control bone pain. They may also help to reduce hypercalcemia. ? Reduce pain (opiates).  Endocrine therapies. These therapies slow cancer growth by blocking specific chemical messengers (hormones). Some types of cancer, including breast and prostate cancers, may grow because of hormones in the body.  Targeted therapy. These drugs are used to block the growth and spread of cancer cells. These drugs target a specific part of the cancer cell and usually cause fewer side effects than chemotherapy.  Immunotherapies. These therapies use the body's defense system (immune system) to fight cancer cells.  Surgery. You may have surgery to remove bone cancer or to prevent or repair a fracture. Follow these instructions at home:   Take medicines only as directed by your health care provider.  If you are taking prescription pain medicine, take actions to prevent or treat constipation. Your health care provider may recommend that you: ? Drink enough fluid to keep your urine pale yellow. ?   Eat foods that are high in fiber, such as fresh fruits and vegetables, whole grains, and beans. ? Limit foods that are high in fat and processed sugars, such as fried and sweet foods. ? Take an over-the-counter or prescription medicine for constipation.  Use devices to help you move around (mobility aids) as needed, such as canes, walkers, or scooters.  Do not drive or use heavy machinery while taking pain medicine.  Do not drink alcohol.  Do not use any products that contain nicotine or tobacco, such as cigarettes and e-cigarettes. If you need help quitting, ask your health care provider.  Keep all follow-up visits as told by your health care provider. This is important. Contact a health care provider if:  Your pain medicine is not helping.  You are not able to care for yourself at home. Get help right away if:  You fall or have an  injury.  Your pain suddenly gets worse.  You have trouble walking.  You have numbness or tingling in your legs.  You lose control of your bowels or your bladder.  You are very sleepy or confused. Summary  Bone metastasis is cancer that has spread from the part of the body where it started to the bones. This condition is more common than cancer that starts in the bones (primary bone cancer).  Bone metastasis is painful and damages and weakens the bones. It can also cause anemia and hypercalcemia.  Treatment for this condition depends on your overall health, the type of cancer you have, and how much the cancer has spread.  Work with your health care team to determine which treatment is best for you. Take all medicines only as told by your health care provider. This information is not intended to replace advice given to you by your health care provider. Make sure you discuss any questions you have with your health care provider. Document Revised: 12/20/2017 Document Reviewed: 04/30/2017 Elsevier Patient Education  [place].

## 2020-02-18 NOTE — Plan of Care (Signed)

## 2020-02-19 ENCOUNTER — Encounter (HOSPITAL_COMMUNITY): Payer: Self-pay | Admitting: Hematology and Oncology

## 2020-02-19 ENCOUNTER — Telehealth: Payer: Self-pay | Admitting: Hematology and Oncology

## 2020-02-19 NOTE — Telephone Encounter (Signed)
Scheduled appt per 11/5 sch msg - unable to reach pt. Left message with appt date and time

## 2020-02-20 DIAGNOSIS — Z8701 Personal history of pneumonia (recurrent): Secondary | ICD-10-CM | POA: Diagnosis not present

## 2020-02-20 DIAGNOSIS — Z85830 Personal history of malignant neoplasm of bone: Secondary | ICD-10-CM | POA: Diagnosis not present

## 2020-02-20 DIAGNOSIS — I130 Hypertensive heart and chronic kidney disease with heart failure and stage 1 through stage 4 chronic kidney disease, or unspecified chronic kidney disease: Secondary | ICD-10-CM | POA: Diagnosis not present

## 2020-02-20 DIAGNOSIS — Z6836 Body mass index (BMI) 36.0-36.9, adult: Secondary | ICD-10-CM | POA: Diagnosis not present

## 2020-02-20 DIAGNOSIS — N3281 Overactive bladder: Secondary | ICD-10-CM | POA: Diagnosis not present

## 2020-02-20 DIAGNOSIS — Z87440 Personal history of urinary (tract) infections: Secondary | ICD-10-CM | POA: Diagnosis not present

## 2020-02-20 DIAGNOSIS — D649 Anemia, unspecified: Secondary | ICD-10-CM | POA: Diagnosis not present

## 2020-02-20 DIAGNOSIS — F330 Major depressive disorder, recurrent, mild: Secondary | ICD-10-CM | POA: Diagnosis not present

## 2020-02-20 DIAGNOSIS — I5032 Chronic diastolic (congestive) heart failure: Secondary | ICD-10-CM | POA: Diagnosis not present

## 2020-02-20 DIAGNOSIS — N1832 Chronic kidney disease, stage 3b: Secondary | ICD-10-CM | POA: Diagnosis not present

## 2020-02-20 DIAGNOSIS — Z9181 History of falling: Secondary | ICD-10-CM | POA: Diagnosis not present

## 2020-02-20 DIAGNOSIS — M6282 Rhabdomyolysis: Secondary | ICD-10-CM | POA: Diagnosis not present

## 2020-02-20 DIAGNOSIS — Z853 Personal history of malignant neoplasm of breast: Secondary | ICD-10-CM | POA: Diagnosis not present

## 2020-02-21 LAB — SURGICAL PATHOLOGY

## 2020-02-22 ENCOUNTER — Telehealth: Payer: Self-pay

## 2020-02-22 NOTE — Telephone Encounter (Signed)
(  4:50pm) Telephone call to patient to schedule palliative care visit with patient.SW did not receive an answer and was unable to leave a message.

## 2020-02-24 ENCOUNTER — Other Ambulatory Visit: Payer: Self-pay | Admitting: Neurology

## 2020-02-26 DIAGNOSIS — Z87440 Personal history of urinary (tract) infections: Secondary | ICD-10-CM | POA: Diagnosis not present

## 2020-02-26 DIAGNOSIS — N1832 Chronic kidney disease, stage 3b: Secondary | ICD-10-CM | POA: Diagnosis not present

## 2020-02-26 DIAGNOSIS — I5032 Chronic diastolic (congestive) heart failure: Secondary | ICD-10-CM | POA: Diagnosis not present

## 2020-02-26 DIAGNOSIS — F330 Major depressive disorder, recurrent, mild: Secondary | ICD-10-CM | POA: Diagnosis not present

## 2020-02-26 DIAGNOSIS — I130 Hypertensive heart and chronic kidney disease with heart failure and stage 1 through stage 4 chronic kidney disease, or unspecified chronic kidney disease: Secondary | ICD-10-CM | POA: Diagnosis not present

## 2020-02-26 DIAGNOSIS — M6282 Rhabdomyolysis: Secondary | ICD-10-CM | POA: Diagnosis not present

## 2020-02-26 DIAGNOSIS — N3281 Overactive bladder: Secondary | ICD-10-CM | POA: Diagnosis not present

## 2020-02-26 DIAGNOSIS — Z8701 Personal history of pneumonia (recurrent): Secondary | ICD-10-CM | POA: Diagnosis not present

## 2020-02-26 DIAGNOSIS — Z85830 Personal history of malignant neoplasm of bone: Secondary | ICD-10-CM | POA: Diagnosis not present

## 2020-02-26 DIAGNOSIS — Z9181 History of falling: Secondary | ICD-10-CM | POA: Diagnosis not present

## 2020-02-26 DIAGNOSIS — D649 Anemia, unspecified: Secondary | ICD-10-CM | POA: Diagnosis not present

## 2020-02-26 DIAGNOSIS — Z6836 Body mass index (BMI) 36.0-36.9, adult: Secondary | ICD-10-CM | POA: Diagnosis not present

## 2020-02-26 DIAGNOSIS — Z853 Personal history of malignant neoplasm of breast: Secondary | ICD-10-CM | POA: Diagnosis not present

## 2020-02-26 DIAGNOSIS — J189 Pneumonia, unspecified organism: Secondary | ICD-10-CM | POA: Diagnosis not present

## 2020-02-26 DIAGNOSIS — C50919 Malignant neoplasm of unspecified site of unspecified female breast: Secondary | ICD-10-CM | POA: Diagnosis not present

## 2020-02-26 DIAGNOSIS — B0229 Other postherpetic nervous system involvement: Secondary | ICD-10-CM | POA: Diagnosis not present

## 2020-03-01 DIAGNOSIS — Z8701 Personal history of pneumonia (recurrent): Secondary | ICD-10-CM | POA: Diagnosis not present

## 2020-03-01 DIAGNOSIS — I130 Hypertensive heart and chronic kidney disease with heart failure and stage 1 through stage 4 chronic kidney disease, or unspecified chronic kidney disease: Secondary | ICD-10-CM | POA: Diagnosis not present

## 2020-03-01 DIAGNOSIS — F330 Major depressive disorder, recurrent, mild: Secondary | ICD-10-CM | POA: Diagnosis not present

## 2020-03-01 DIAGNOSIS — Z85830 Personal history of malignant neoplasm of bone: Secondary | ICD-10-CM | POA: Diagnosis not present

## 2020-03-01 DIAGNOSIS — D649 Anemia, unspecified: Secondary | ICD-10-CM | POA: Diagnosis not present

## 2020-03-01 DIAGNOSIS — Z9181 History of falling: Secondary | ICD-10-CM | POA: Diagnosis not present

## 2020-03-01 DIAGNOSIS — N1832 Chronic kidney disease, stage 3b: Secondary | ICD-10-CM | POA: Diagnosis not present

## 2020-03-01 DIAGNOSIS — Z853 Personal history of malignant neoplasm of breast: Secondary | ICD-10-CM | POA: Diagnosis not present

## 2020-03-01 DIAGNOSIS — N3281 Overactive bladder: Secondary | ICD-10-CM | POA: Diagnosis not present

## 2020-03-01 DIAGNOSIS — Z87440 Personal history of urinary (tract) infections: Secondary | ICD-10-CM | POA: Diagnosis not present

## 2020-03-01 DIAGNOSIS — Z6836 Body mass index (BMI) 36.0-36.9, adult: Secondary | ICD-10-CM | POA: Diagnosis not present

## 2020-03-01 DIAGNOSIS — M6282 Rhabdomyolysis: Secondary | ICD-10-CM | POA: Diagnosis not present

## 2020-03-01 DIAGNOSIS — I5032 Chronic diastolic (congestive) heart failure: Secondary | ICD-10-CM | POA: Diagnosis not present

## 2020-03-16 ENCOUNTER — Telehealth: Payer: Self-pay

## 2020-03-16 NOTE — Telephone Encounter (Signed)
10:23am-SW scheduled initial palliative care visit with patient for 12/2@11am . Patient request possible NP visit due to have a cancer spot on her right thigh. She requested more feedback from NP to determine what should be done. Patient is bedbound.

## 2020-03-17 ENCOUNTER — Other Ambulatory Visit: Payer: PPO

## 2020-03-17 ENCOUNTER — Other Ambulatory Visit: Payer: Self-pay

## 2020-03-17 DIAGNOSIS — Z8701 Personal history of pneumonia (recurrent): Secondary | ICD-10-CM | POA: Diagnosis not present

## 2020-03-17 DIAGNOSIS — Z515 Encounter for palliative care: Secondary | ICD-10-CM

## 2020-03-17 DIAGNOSIS — N1832 Chronic kidney disease, stage 3b: Secondary | ICD-10-CM | POA: Diagnosis not present

## 2020-03-17 DIAGNOSIS — Z6836 Body mass index (BMI) 36.0-36.9, adult: Secondary | ICD-10-CM | POA: Diagnosis not present

## 2020-03-17 DIAGNOSIS — D649 Anemia, unspecified: Secondary | ICD-10-CM | POA: Diagnosis not present

## 2020-03-17 DIAGNOSIS — Z85830 Personal history of malignant neoplasm of bone: Secondary | ICD-10-CM | POA: Diagnosis not present

## 2020-03-17 DIAGNOSIS — I5032 Chronic diastolic (congestive) heart failure: Secondary | ICD-10-CM | POA: Diagnosis not present

## 2020-03-17 DIAGNOSIS — I130 Hypertensive heart and chronic kidney disease with heart failure and stage 1 through stage 4 chronic kidney disease, or unspecified chronic kidney disease: Secondary | ICD-10-CM | POA: Diagnosis not present

## 2020-03-17 DIAGNOSIS — Z853 Personal history of malignant neoplasm of breast: Secondary | ICD-10-CM | POA: Diagnosis not present

## 2020-03-17 DIAGNOSIS — Z9181 History of falling: Secondary | ICD-10-CM | POA: Diagnosis not present

## 2020-03-17 DIAGNOSIS — Z87440 Personal history of urinary (tract) infections: Secondary | ICD-10-CM | POA: Diagnosis not present

## 2020-03-17 DIAGNOSIS — M6282 Rhabdomyolysis: Secondary | ICD-10-CM | POA: Diagnosis not present

## 2020-03-17 DIAGNOSIS — F330 Major depressive disorder, recurrent, mild: Secondary | ICD-10-CM | POA: Diagnosis not present

## 2020-03-17 DIAGNOSIS — N3281 Overactive bladder: Secondary | ICD-10-CM | POA: Diagnosis not present

## 2020-03-18 ENCOUNTER — Telehealth: Payer: Self-pay

## 2020-03-18 DIAGNOSIS — C7951 Secondary malignant neoplasm of bone: Secondary | ICD-10-CM | POA: Diagnosis not present

## 2020-03-18 DIAGNOSIS — J189 Pneumonia, unspecified organism: Secondary | ICD-10-CM | POA: Diagnosis not present

## 2020-03-18 DIAGNOSIS — R531 Weakness: Secondary | ICD-10-CM | POA: Diagnosis not present

## 2020-03-18 NOTE — Telephone Encounter (Signed)
Called and left a message asking her to call the office back. Reminding her of appt Monday with Dr. Alvy Bimler at 1 pm. If she cannot arrange trans[portation for appt, we can switch appt to a e-visit.

## 2020-03-20 ENCOUNTER — Other Ambulatory Visit: Payer: Self-pay

## 2020-03-20 ENCOUNTER — Inpatient Hospital Stay (HOSPITAL_COMMUNITY)
Admission: EM | Admit: 2020-03-20 | Discharge: 2020-03-26 | DRG: 202 | Disposition: A | Discharge status: Hospice | Payer: PPO | Attending: Internal Medicine | Admitting: Internal Medicine

## 2020-03-20 ENCOUNTER — Emergency Department (HOSPITAL_COMMUNITY): Payer: PPO

## 2020-03-20 DIAGNOSIS — N1832 Chronic kidney disease, stage 3b: Secondary | ICD-10-CM | POA: Diagnosis not present

## 2020-03-20 DIAGNOSIS — Z20822 Contact with and (suspected) exposure to covid-19: Secondary | ICD-10-CM | POA: Diagnosis not present

## 2020-03-20 DIAGNOSIS — R059 Cough, unspecified: Secondary | ICD-10-CM

## 2020-03-20 DIAGNOSIS — R0902 Hypoxemia: Secondary | ICD-10-CM | POA: Diagnosis not present

## 2020-03-20 DIAGNOSIS — I959 Hypotension, unspecified: Secondary | ICD-10-CM | POA: Diagnosis not present

## 2020-03-20 DIAGNOSIS — I130 Hypertensive heart and chronic kidney disease with heart failure and stage 1 through stage 4 chronic kidney disease, or unspecified chronic kidney disease: Secondary | ICD-10-CM | POA: Diagnosis not present

## 2020-03-20 DIAGNOSIS — Z8701 Personal history of pneumonia (recurrent): Secondary | ICD-10-CM | POA: Diagnosis not present

## 2020-03-20 DIAGNOSIS — N1330 Unspecified hydronephrosis: Secondary | ICD-10-CM | POA: Diagnosis not present

## 2020-03-20 DIAGNOSIS — C50919 Malignant neoplasm of unspecified site of unspecified female breast: Secondary | ICD-10-CM | POA: Diagnosis present

## 2020-03-20 DIAGNOSIS — Z7401 Bed confinement status: Secondary | ICD-10-CM | POA: Diagnosis not present

## 2020-03-20 DIAGNOSIS — R531 Weakness: Secondary | ICD-10-CM | POA: Diagnosis not present

## 2020-03-20 DIAGNOSIS — Z66 Do not resuscitate: Secondary | ICD-10-CM | POA: Diagnosis present

## 2020-03-20 DIAGNOSIS — D649 Anemia, unspecified: Secondary | ICD-10-CM | POA: Diagnosis not present

## 2020-03-20 DIAGNOSIS — Z79899 Other long term (current) drug therapy: Secondary | ICD-10-CM

## 2020-03-20 DIAGNOSIS — C7951 Secondary malignant neoplasm of bone: Secondary | ICD-10-CM | POA: Diagnosis present

## 2020-03-20 DIAGNOSIS — Z7952 Long term (current) use of systemic steroids: Secondary | ICD-10-CM

## 2020-03-20 DIAGNOSIS — Y92009 Unspecified place in unspecified non-institutional (private) residence as the place of occurrence of the external cause: Secondary | ICD-10-CM | POA: Diagnosis not present

## 2020-03-20 DIAGNOSIS — F32A Depression, unspecified: Secondary | ICD-10-CM | POA: Diagnosis present

## 2020-03-20 DIAGNOSIS — J208 Acute bronchitis due to other specified organisms: Secondary | ICD-10-CM | POA: Diagnosis not present

## 2020-03-20 DIAGNOSIS — M898X9 Other specified disorders of bone, unspecified site: Secondary | ICD-10-CM | POA: Diagnosis present

## 2020-03-20 DIAGNOSIS — Z87891 Personal history of nicotine dependence: Secondary | ICD-10-CM | POA: Diagnosis not present

## 2020-03-20 DIAGNOSIS — W19XXXA Unspecified fall, initial encounter: Secondary | ICD-10-CM | POA: Diagnosis present

## 2020-03-20 DIAGNOSIS — F29 Unspecified psychosis not due to a substance or known physiological condition: Secondary | ICD-10-CM | POA: Diagnosis not present

## 2020-03-20 DIAGNOSIS — R0602 Shortness of breath: Secondary | ICD-10-CM | POA: Diagnosis not present

## 2020-03-20 DIAGNOSIS — J9601 Acute respiratory failure with hypoxia: Secondary | ICD-10-CM | POA: Diagnosis present

## 2020-03-20 DIAGNOSIS — B9781 Human metapneumovirus as the cause of diseases classified elsewhere: Secondary | ICD-10-CM | POA: Diagnosis present

## 2020-03-20 DIAGNOSIS — I5032 Chronic diastolic (congestive) heart failure: Secondary | ICD-10-CM | POA: Diagnosis not present

## 2020-03-20 DIAGNOSIS — M2550 Pain in unspecified joint: Secondary | ICD-10-CM | POA: Diagnosis not present

## 2020-03-20 DIAGNOSIS — R062 Wheezing: Secondary | ICD-10-CM | POA: Diagnosis not present

## 2020-03-20 DIAGNOSIS — J069 Acute upper respiratory infection, unspecified: Secondary | ICD-10-CM

## 2020-03-20 DIAGNOSIS — J80 Acute respiratory distress syndrome: Secondary | ICD-10-CM | POA: Diagnosis not present

## 2020-03-20 DIAGNOSIS — R296 Repeated falls: Secondary | ICD-10-CM | POA: Diagnosis not present

## 2020-03-20 LAB — COMPREHENSIVE METABOLIC PANEL
ALT: 8 U/L (ref 0–44)
AST: 12 U/L — ABNORMAL LOW (ref 15–41)
Albumin: 3.5 g/dL (ref 3.5–5.0)
Alkaline Phosphatase: 64 U/L (ref 38–126)
Anion gap: 12 (ref 5–15)
BUN: 20 mg/dL (ref 8–23)
CO2: 22 mmol/L (ref 22–32)
Calcium: 8.9 mg/dL (ref 8.9–10.3)
Chloride: 101 mmol/L (ref 98–111)
Creatinine, Ser: 1.39 mg/dL — ABNORMAL HIGH (ref 0.44–1.00)
GFR, Estimated: 38 mL/min — ABNORMAL LOW (ref >60)
Glucose, Bld: 157 mg/dL — ABNORMAL HIGH (ref 70–99)
Potassium: 4.1 mmol/L (ref 3.5–5.1)
Sodium: 135 mmol/L (ref 135–145)
Total Bilirubin: 0.4 mg/dL (ref 0.3–1.2)
Total Protein: 7.9 g/dL (ref 6.5–8.1)

## 2020-03-20 LAB — CBC
HCT: 35.1 % — ABNORMAL LOW (ref 36.0–46.0)
Hemoglobin: 10.6 g/dL — ABNORMAL LOW (ref 12.0–15.0)
MCH: 28 pg (ref 26.0–34.0)
MCHC: 30.2 g/dL (ref 30.0–36.0)
MCV: 92.6 fL (ref 80.0–100.0)
Platelets: 278 10*3/uL (ref 150–400)
RBC: 3.79 MIL/uL — ABNORMAL LOW (ref 3.87–5.11)
RDW: 15 % (ref 11.5–15.5)
WBC: 6.9 10*3/uL (ref 4.0–10.5)
nRBC: 0 % (ref 0.0–0.2)

## 2020-03-20 LAB — RESP PANEL BY RT-PCR (FLU A&B, COVID) ARPGX2
Influenza A by PCR: NEGATIVE
Influenza B by PCR: NEGATIVE
SARS Coronavirus 2 by RT PCR: NEGATIVE

## 2020-03-20 LAB — BRAIN NATRIURETIC PEPTIDE: B Natriuretic Peptide: 71.4 pg/mL (ref 0.0–100.0)

## 2020-03-20 MED ORDER — GUAIFENESIN 100 MG/5ML PO SOLN
10.0000 mL | Freq: Once | ORAL | Status: AC
  Administered 2020-03-20: 200 mg via ORAL
  Filled 2020-03-20: qty 20

## 2020-03-20 MED ORDER — IPRATROPIUM BROMIDE HFA 17 MCG/ACT IN AERS
2.0000 | INHALATION_SPRAY | Freq: Once | RESPIRATORY_TRACT | Status: AC
  Administered 2020-03-20: 2 via RESPIRATORY_TRACT
  Filled 2020-03-20: qty 12.9

## 2020-03-20 MED ORDER — AZITHROMYCIN 250 MG PO TABS: 250.0000 mg | ORAL_TABLET | Freq: Every day | ORAL | 0 refills | Status: DC

## 2020-03-20 MED ORDER — ALBUTEROL SULFATE HFA 108 (90 BASE) MCG/ACT IN AERS
2.0000 | INHALATION_SPRAY | RESPIRATORY_TRACT | Status: DC | PRN
  Administered 2020-03-24 – 2020-03-25 (×2): 2 via RESPIRATORY_TRACT
  Filled 2020-03-20: qty 6.7

## 2020-03-20 MED ORDER — AZITHROMYCIN 250 MG PO TABS
500.0000 mg | ORAL_TABLET | Freq: Once | ORAL | Status: AC
  Administered 2020-03-21: 500 mg via ORAL
  Filled 2020-03-20: qty 2

## 2020-03-20 MED ORDER — METHYLPREDNISOLONE SODIUM SUCC 125 MG IJ SOLR
125.0000 mg | Freq: Once | INTRAMUSCULAR | Status: AC
  Administered 2020-03-20: 125 mg via INTRAVENOUS
  Filled 2020-03-20: qty 2

## 2020-03-20 MED ORDER — ALBUTEROL SULFATE HFA 108 (90 BASE) MCG/ACT IN AERS
4.0000 | INHALATION_SPRAY | Freq: Once | RESPIRATORY_TRACT | Status: AC
  Administered 2020-03-20: 4 via RESPIRATORY_TRACT
  Filled 2020-03-20: qty 6.7

## 2020-03-20 MED ORDER — PREDNISONE 20 MG PO TABS: 40.0000 mg | ORAL_TABLET | Freq: Every day | ORAL | 0 refills | Status: DC

## 2020-03-20 MED ORDER — ALBUTEROL SULFATE HFA 108 (90 BASE) MCG/ACT IN AERS: 2.0000 | INHALATION_SPRAY | RESPIRATORY_TRACT | 1 refills | Status: AC | PRN

## 2020-03-20 NOTE — ED Provider Notes (Addendum)
[facility] DEPT Provider Note   CSN: [id number] Arrival date & time: 03/20/20  2110     History Chief Complaint  Patient presents with  . Shortness of Breath    DEMMI Schneider is a 79 y.o. female.  Patient c/o non prod cough , and sob. Patient indicates symptoms acute onset in past three days, episodic, persistent, worsening. Denies chest pain. No sore throat or runny nose. No known covid or flu exposure. Denies fever or chills. No wt loss. No night sweats. EMS noted pulse ox 88% and wheezing, neb provided. Patient denies leg pain or swelling. +recent dx met breast ca to bone. Non smoker.  No hx copd or asthma.   The history is provided by the patient and the EMS personnel.  Shortness of Breath Associated symptoms: cough   Associated symptoms: no abdominal pain, no chest pain, no fever, no headaches, no neck pain, no rash, no sore throat and no vomiting        Past Medical History:  Diagnosis Date  . HTN (hypertension)   . Vision abnormalities     Patient Active Problem List   Diagnosis Date Noted  . Metastatic cancer to bone ([place])   . Advanced care planning/counseling discussion   . Goals of care, counseling/discussion   . Palliative care by specialist   . Fall at home, initial encounter 12/09/2019  . Acute lower UTI 12/09/2019  . Rhabdomyolysis 12/09/2019  . Dehydration 12/09/2019  . Advanced age 92/25/2021  . Bilateral knee pain 12/09/2019  . Facet joint syndrome 05/13/2017  . Spinal stenosis, lumbar 09/25/2016  . Lower back pain 03/27/2016  . Excessive daytime sleepiness 05/24/2015  . Postherpetic neuralgia 02/18/2015  . Dysesthesia 02/18/2015  . Insomnia 02/18/2015  . Snoring 02/18/2015    Past Surgical History:  Procedure Laterality Date  . CHOLECYSTECTOMY, LAPAROSCOPIC       OB History   No obstetric history on file.     Family History  Problem Relation Age of Onset  . Seizures Mother   . Alcoholism Father      Social History   Tobacco Use  . Smoking status: Former Research scientist (life sciences)  . Smokeless tobacco: Never Used  Substance Use Topics  . Alcohol use: Yes    Alcohol/week: 0.0 standard drinks    Comment: Rare--about twice per yr/fim  . Drug use: No    Home Medications Prior to Admission medications   Medication Sig Start Date End Date Taking? Authorizing Provider  acetaminophen (TYLENOL) 500 MG tablet Take 1 tablet (500 mg total) by mouth every 6 (six) hours as needed for mild pain, fever or headache. 02/18/20   Cherene Altes, MD  escitalopram (LEXAPRO) 10 MG tablet Take 1 tablet (10 mg total) by mouth daily. 02/19/20   Cherene Altes, MD  lamoTRIgine (LAMICTAL) 150 MG tablet Take 1 tablet (150 mg total) by mouth 2 (two) times daily. 02/18/20   Cherene Altes, MD  lidocaine (LIDODERM) 5 % Place 2 patches onto the skin daily. Remove & Discard patch within 12 hours or as directed by MD 02/19/20   Cherene Altes, MD  meloxicam (MOBIC) 7.5 MG tablet Take 1 tablet (7.5 mg total) by mouth daily as needed for pain. 02/18/20   Cherene Altes, MD  oxybutynin (DITROPAN) 5 MG tablet Take 1 tablet (5 mg total) by mouth 2 (two) times daily. 02/18/20   Cherene Altes, MD    Allergies    Patient has no known allergies.  Review of Systems   Review of Systems  Constitutional: Negative for chills and fever.  HENT: Negative for sore throat.   Eyes: Negative for redness.  Respiratory: Positive for cough and shortness of breath.   Cardiovascular: Negative for chest pain and leg swelling.  Gastrointestinal: Negative for abdominal pain and vomiting.  Genitourinary: Negative for flank pain.  Musculoskeletal: Negative for back pain and neck pain.  Skin: Negative for rash.  Neurological: Negative for headaches.  Hematological: Does not bruise/bleed easily.  Psychiatric/Behavioral: Negative for confusion.    Physical Exam Updated Vital Signs BP 129/70   Pulse (!) 101   Temp 98.6 F (37 C)  (Oral)   Resp (!) 25   Ht 1.626 m (_0 )   Wt 73.9 kg   SpO2 96%   BMI 27.98 kg/m   Physical Exam Vitals and nursing note reviewed.  Constitutional:      Appearance: Normal appearance. She is well-developed.  HENT:     Head: Atraumatic.     Nose: Nose normal.     Mouth/Throat:     Mouth: Mucous membranes are moist.  Eyes:     General: No scleral icterus.    Conjunctiva/sclera: Conjunctivae normal.  Neck:     Trachea: No tracheal deviation.  Cardiovascular:     Rate and Rhythm: Normal rate and regular rhythm.     Pulses: Normal pulses.     Heart sounds: Normal heart sounds. No murmur heard.  No friction rub. No gallop.   Pulmonary:     Effort: Pulmonary effort is normal. No respiratory distress.     Breath sounds: Wheezing and rhonchi present.  Abdominal:     General: Bowel sounds are normal. There is no distension.     Palpations: Abdomen is soft.     Tenderness: There is no abdominal tenderness. There is no guarding.  Genitourinary:    Comments: No cva tenderness.  Musculoskeletal:        General: No swelling or tenderness.     Cervical back: Normal range of motion and neck supple. No rigidity. No muscular tenderness.     Right lower leg: No edema.     Left lower leg: No edema.  Skin:    General: Skin is warm and dry.     Findings: No rash.  Neurological:     Mental Status: She is alert.     Comments: Alert, speech normal.   Psychiatric:        Mood and Affect: Mood normal.     ED Results / Procedures / Treatments   Labs (all labs ordered are listed, but only abnormal results are displayed) Results for orders placed or performed during the hospital encounter of 03/20/20  Resp Panel by RT-PCR (Flu A&B, Covid) Nasopharyngeal Swab   Specimen: Nasopharyngeal Swab; Nasopharyngeal(NP) swabs in vial transport medium  Result Value Ref Range   SARS Coronavirus 2 by RT PCR NEGATIVE NEGATIVE   Influenza A by PCR NEGATIVE NEGATIVE   Influenza B by PCR NEGATIVE  NEGATIVE  Brain natriuretic peptide  Result Value Ref Range   B Natriuretic Peptide 71.4 0.0 - 100.0 pg/mL  CBC  Result Value Ref Range   WBC 6.9 4.0 - 10.5 K/uL   RBC 3.79 (L) 3.87 - 5.11 MIL/uL   Hemoglobin 10.6 (L) 12.0 - 15.0 g/dL   HCT 35.1 (L) 36 - 46 %   MCV 92.6 80.0 - 100.0 fL   MCH 28.0 26.0 - 34.0 pg   MCHC 30.2 30.0 -  36.0 g/dL   RDW 15.0 11.5 - 15.5 %   Platelets 278 150 - 400 K/uL   nRBC 0.0 0.0 - 0.2 %  Comprehensive metabolic panel  Result Value Ref Range   Sodium 135 135 - 145 mmol/L   Potassium 4.1 3.5 - 5.1 mmol/L   Chloride 101 98 - 111 mmol/L   CO2 22 22 - 32 mmol/L   Glucose, Bld 157 (H) 70 - 99 mg/dL   BUN 20 8 - 23 mg/dL   Creatinine, Ser 1.39 (H) 0.44 - 1.00 mg/dL   Calcium 8.9 8.9 - 10.3 mg/dL   Total Protein 7.9 6.5 - 8.1 g/dL   Albumin 3.5 3.5 - 5.0 g/dL   AST 12 (L) 15 - 41 U/L   ALT 8 0 - 44 U/L   Alkaline Phosphatase 64 38 - 126 U/L   Total Bilirubin 0.4 0.3 - 1.2 mg/dL   GFR, Estimated 38 (L) >60 mL/min   Anion gap 12 5 - 15   DG Chest Port 1 View  Result Date: 03/20/2020 CLINICAL DATA:  Cough and short of breath, hypoxia, history of breast cancer EXAM: PORTABLE CHEST 1 VIEW COMPARISON:  02/07/2020, 02/10/2020 FINDINGS: Single frontal view of the chest demonstrates an unremarkable cardiac silhouette. Mild diffuse interstitial prominence unchanged. No acute airspace disease, effusion, or pneumothorax. The sclerotic bony lesions seen on CT are less apparent by radiograph. No acute fracture. IMPRESSION: 1. Stable chest, no acute airspace disease. 2. Sclerotic bony metastases seen on previous CTs are less apparent by radiograph. Electronically Signed   By: Randa Ngo M.D.   On: 03/20/2020 22:01    EKG EKG Interpretation  Date/Time:  Sunday March 20 2020 21:56:27 EST Ventricular Rate:  94 PR Interval:    QRS Duration: 89 QT Interval:  357 QTC Calculation: 447 R Axis:   -48 Text Interpretation: Sinus rhythm Left anterior fascicular  block Left axis deviation Low voltage, precordial leads Confirmed by Lajean Saver [phone]) on 03/20/2020 10:10:17 PM   Radiology DG Chest Port 1 View  Result Date: 03/20/2020 CLINICAL DATA:  Cough and short of breath, hypoxia, history of breast cancer EXAM: PORTABLE CHEST 1 VIEW COMPARISON:  02/07/2020, 02/10/2020 FINDINGS: Single frontal view of the chest demonstrates an unremarkable cardiac silhouette. Mild diffuse interstitial prominence unchanged. No acute airspace disease, effusion, or pneumothorax. The sclerotic bony lesions seen on CT are less apparent by radiograph. No acute fracture. IMPRESSION: 1. Stable chest, no acute airspace disease. 2. Sclerotic bony metastases seen on previous CTs are less apparent by radiograph. Electronically Signed   By: Randa Ngo M.D.   On: 03/20/2020 22:01    Procedures Procedures (including critical care time)  Medications Ordered in ED Medications  albuterol (VENTOLIN HFA) 108 (90 Base) MCG/ACT inhaler 4 puff (has no administration in time range)  guaiFENesin (ROBITUSSIN) 100 MG/5ML solution 200 mg (has no administration in time range)    ED Course  I have reviewed the triage vital signs and the nursing notes.  Pertinent labs & imaging results that were available during my care of the patient were reviewed by me and considered in my medical decision making (see chart for details).    MDM Rules/Calculators/A&P                         Iv ns. Continuous pulse ox and cardiac monitoring. Stat labs and imaging. Albuterol tx. Solumedrol.   Reviewed nursing notes and prior charts for additional history.  Recent  hospitalization reviewed.   Labs reviewed/interpreted by me - chem normal. Wbc normal. bnp normal.   CXR reviewed/intepreted by me - no pna. ?bronchitis, vs v early/mild pna, vs viral syndrome.   zithromax po.   Recheck wheezing much improved. Pt is breathing more comfortably. Mild wheezing persists. Albuterol/atrovent neb.  Covid/flu  test negative.   On room air, pulse ox is 86-87%. Of note, during hospitalization last month, pt w marginal o2 sats, but at d/c no home o2 arranged.   Given hypoxia, will consult hospitalist for admission.       Final Clinical Impression(s) / ED Diagnoses Final diagnoses:  None    Rx / DC Orders ED Discharge Orders    None          Lajean Saver, MD 03/21/20 0004

## 2020-03-20 NOTE — Discharge Instructions (Addendum)
It was our pleasure to provide your ER care today - we hope that you feel better.  Take robitussin or mucinex as need for cough. Take zithromax (antibiotic) as prescribed.   Use albuterol inhaler as need if wheezing. Take prednisone as prescribed.   Follow up with your doctor in the next few days if symptoms fail to improve/resolve. Follow up with your oncologist as already arranged.   Return to ER right away if worse, new symptoms, high fevers, chest pain, increased trouble breathing, or other concern.

## 2020-03-20 NOTE — ED Triage Notes (Signed)
Pt came in via EMS from home with c/o SOB that worsened today. Pt room air sats were 88. Pt does not wear O2 at home. She is currently on 3L with a sat of 96%. EMS administered a duoneb, solu medrol and mag sulfate. Pt is currently tachypneic but states she is feeling better, Rhonci noted bilat lungs.

## 2020-03-21 ENCOUNTER — Encounter (HOSPITAL_COMMUNITY): Payer: Self-pay | Admitting: Family Medicine

## 2020-03-21 ENCOUNTER — Inpatient Hospital Stay: Payer: PPO | Admitting: Hematology and Oncology

## 2020-03-21 ENCOUNTER — Other Ambulatory Visit: Payer: Self-pay

## 2020-03-21 ENCOUNTER — Inpatient Hospital Stay (HOSPITAL_COMMUNITY): Payer: PPO

## 2020-03-21 DIAGNOSIS — F32A Depression, unspecified: Secondary | ICD-10-CM | POA: Diagnosis present

## 2020-03-21 DIAGNOSIS — I5032 Chronic diastolic (congestive) heart failure: Secondary | ICD-10-CM | POA: Diagnosis present

## 2020-03-21 DIAGNOSIS — R296 Repeated falls: Secondary | ICD-10-CM | POA: Diagnosis present

## 2020-03-21 DIAGNOSIS — D649 Anemia, unspecified: Secondary | ICD-10-CM

## 2020-03-21 DIAGNOSIS — N1832 Chronic kidney disease, stage 3b: Secondary | ICD-10-CM | POA: Diagnosis present

## 2020-03-21 DIAGNOSIS — C7951 Secondary malignant neoplasm of bone: Secondary | ICD-10-CM | POA: Diagnosis present

## 2020-03-21 DIAGNOSIS — Z79899 Other long term (current) drug therapy: Secondary | ICD-10-CM | POA: Diagnosis not present

## 2020-03-21 DIAGNOSIS — C50919 Malignant neoplasm of unspecified site of unspecified female breast: Secondary | ICD-10-CM | POA: Diagnosis present

## 2020-03-21 DIAGNOSIS — Z66 Do not resuscitate: Secondary | ICD-10-CM | POA: Diagnosis present

## 2020-03-21 DIAGNOSIS — J9601 Acute respiratory failure with hypoxia: Secondary | ICD-10-CM

## 2020-03-21 DIAGNOSIS — R062 Wheezing: Secondary | ICD-10-CM | POA: Diagnosis present

## 2020-03-21 DIAGNOSIS — Z7401 Bed confinement status: Secondary | ICD-10-CM | POA: Diagnosis not present

## 2020-03-21 DIAGNOSIS — Z87891 Personal history of nicotine dependence: Secondary | ICD-10-CM | POA: Diagnosis not present

## 2020-03-21 DIAGNOSIS — M898X9 Other specified disorders of bone, unspecified site: Secondary | ICD-10-CM | POA: Diagnosis present

## 2020-03-21 DIAGNOSIS — J208 Acute bronchitis due to other specified organisms: Secondary | ICD-10-CM | POA: Diagnosis present

## 2020-03-21 DIAGNOSIS — I130 Hypertensive heart and chronic kidney disease with heart failure and stage 1 through stage 4 chronic kidney disease, or unspecified chronic kidney disease: Secondary | ICD-10-CM | POA: Diagnosis present

## 2020-03-21 DIAGNOSIS — Z7952 Long term (current) use of systemic steroids: Secondary | ICD-10-CM | POA: Diagnosis not present

## 2020-03-21 DIAGNOSIS — Z20822 Contact with and (suspected) exposure to covid-19: Secondary | ICD-10-CM | POA: Diagnosis present

## 2020-03-21 DIAGNOSIS — N1330 Unspecified hydronephrosis: Secondary | ICD-10-CM | POA: Diagnosis present

## 2020-03-21 DIAGNOSIS — B9781 Human metapneumovirus as the cause of diseases classified elsewhere: Secondary | ICD-10-CM | POA: Diagnosis present

## 2020-03-21 DIAGNOSIS — Z8701 Personal history of pneumonia (recurrent): Secondary | ICD-10-CM | POA: Diagnosis not present

## 2020-03-21 DIAGNOSIS — Y92009 Unspecified place in unspecified non-institutional (private) residence as the place of occurrence of the external cause: Secondary | ICD-10-CM | POA: Diagnosis not present

## 2020-03-21 DIAGNOSIS — W19XXXA Unspecified fall, initial encounter: Secondary | ICD-10-CM | POA: Diagnosis present

## 2020-03-21 LAB — RESPIRATORY PANEL BY PCR

## 2020-03-21 LAB — BASIC METABOLIC PANEL
Anion gap: 13 (ref 5–15)
BUN: 23 mg/dL (ref 8–23)
CO2: 21 mmol/L — ABNORMAL LOW (ref 22–32)
Calcium: 8.5 mg/dL — ABNORMAL LOW (ref 8.9–10.3)
Chloride: 100 mmol/L (ref 98–111)
Creatinine, Ser: 1.51 mg/dL — ABNORMAL HIGH (ref 0.44–1.00)
GFR, Estimated: 35 mL/min — ABNORMAL LOW (ref >60)
Glucose, Bld: 207 mg/dL — ABNORMAL HIGH (ref 70–99)
Potassium: 5 mmol/L (ref 3.5–5.1)
Sodium: 134 mmol/L — ABNORMAL LOW (ref 135–145)

## 2020-03-21 LAB — CBC
HCT: 33.6 % — ABNORMAL LOW (ref 36.0–46.0)
Hemoglobin: 10.4 g/dL — ABNORMAL LOW (ref 12.0–15.0)
MCH: 28.7 pg (ref 26.0–34.0)
MCHC: 31 g/dL (ref 30.0–36.0)
MCV: 92.6 fL (ref 80.0–100.0)
Platelets: 276 10*3/uL (ref 150–400)
RBC: 3.63 MIL/uL — ABNORMAL LOW (ref 3.87–5.11)
RDW: 15 % (ref 11.5–15.5)
WBC: 5.2 10*3/uL (ref 4.0–10.5)
nRBC: 0 % (ref 0.0–0.2)

## 2020-03-21 LAB — PROCALCITONIN: Procalcitonin: <0.1 ng/mL

## 2020-03-21 LAB — D-DIMER, QUANTITATIVE: D-Dimer, Quant: 1.54 ug/mL-FEU — ABNORMAL HIGH (ref 0.00–0.50)

## 2020-03-21 MED ORDER — HEPARIN SODIUM (PORCINE) 5000 UNIT/ML IJ SOLN
5000.0000 [IU] | Freq: Three times a day (TID) | INTRAMUSCULAR | Status: DC
  Administered 2020-03-21 – 2020-03-26 (×16): 5000 [IU] via SUBCUTANEOUS
  Filled 2020-03-21 (×16): qty 1

## 2020-03-21 MED ORDER — LIDOCAINE 5 % EX PTCH
2.0000 | MEDICATED_PATCH | CUTANEOUS | Status: DC
  Administered 2020-03-21 – 2020-03-25 (×5): 2 via TRANSDERMAL
  Filled 2020-03-21 (×7): qty 2

## 2020-03-21 MED ORDER — LAMOTRIGINE 25 MG PO TABS
150.0000 mg | ORAL_TABLET | Freq: Two times a day (BID) | ORAL | Status: DC
  Administered 2020-03-21 – 2020-03-26 (×12): 150 mg via ORAL
  Filled 2020-03-21: qty 1
  Filled 2020-03-21 (×2): qty 2
  Filled 2020-03-21 (×4): qty 1
  Filled 2020-03-21 (×3): qty 2
  Filled 2020-03-21: qty 1
  Filled 2020-03-21: qty 2

## 2020-03-21 MED ORDER — ONDANSETRON HCL 4 MG PO TABS: 4.0000 mg | ORAL_TABLET | Freq: Four times a day (QID) | ORAL | Status: DC | PRN

## 2020-03-21 MED ORDER — SODIUM CHLORIDE 0.9% FLUSH: 3.0000 mL | INTRAVENOUS | Status: DC | PRN

## 2020-03-21 MED ORDER — IOHEXOL 350 MG/ML SOLN
80.0000 mL | Freq: Once | INTRAVENOUS | Status: AC | PRN
  Administered 2020-03-21: 80 mL via INTRAVENOUS

## 2020-03-21 MED ORDER — OXYBUTYNIN CHLORIDE 5 MG PO TABS
5.0000 mg | ORAL_TABLET | Freq: Two times a day (BID) | ORAL | Status: DC
  Administered 2020-03-21 – 2020-03-26 (×11): 5 mg via ORAL
  Filled 2020-03-21 (×11): qty 1

## 2020-03-21 MED ORDER — ACETAMINOPHEN 325 MG PO TABS
650.0000 mg | ORAL_TABLET | Freq: Four times a day (QID) | ORAL | Status: DC | PRN
  Administered 2020-03-25: 650 mg via ORAL
  Filled 2020-03-21: qty 2

## 2020-03-21 MED ORDER — SODIUM CHLORIDE 0.9 % IV SOLN: INTRAVENOUS | Status: DC

## 2020-03-21 MED ORDER — SODIUM CHLORIDE 0.9% FLUSH
3.0000 mL | Freq: Two times a day (BID) | INTRAVENOUS | Status: DC
  Administered 2020-03-21 – 2020-03-26 (×9): 3 mL via INTRAVENOUS

## 2020-03-21 MED ORDER — ONDANSETRON HCL 4 MG/2ML IJ SOLN: 4.0000 mg | Freq: Four times a day (QID) | INTRAMUSCULAR | Status: DC | PRN

## 2020-03-21 MED ORDER — POLYETHYLENE GLYCOL 3350 17 G PO PACK: 17.0000 g | PACK | Freq: Every day | ORAL | Status: DC | PRN

## 2020-03-21 MED ORDER — PREDNISONE 20 MG PO TABS
50.0000 mg | ORAL_TABLET | Freq: Every day | ORAL | Status: DC
  Administered 2020-03-21: 50 mg via ORAL
  Filled 2020-03-21: qty 1

## 2020-03-21 MED ORDER — SODIUM CHLORIDE (PF) 0.9 % IJ SOLN
INTRAMUSCULAR | Status: AC
  Filled 2020-03-21: qty 50

## 2020-03-21 MED ORDER — OXYCODONE HCL 5 MG PO TABS: 2.5000 mg | ORAL_TABLET | Freq: Four times a day (QID) | ORAL | Status: DC | PRN

## 2020-03-21 MED ORDER — ACETAMINOPHEN 650 MG RE SUPP: 650.0000 mg | Freq: Four times a day (QID) | RECTAL | Status: DC | PRN

## 2020-03-21 MED ORDER — SODIUM CHLORIDE 0.9 % IV SOLN: 250.0000 mL | INTRAVENOUS | Status: DC | PRN

## 2020-03-21 MED ORDER — ESCITALOPRAM OXALATE 10 MG PO TABS
10.0000 mg | ORAL_TABLET | Freq: Every day | ORAL | Status: DC
  Administered 2020-03-21 – 2020-03-26 (×6): 10 mg via ORAL
  Filled 2020-03-21 (×6): qty 1

## 2020-03-21 NOTE — H&P (Signed)
History and Physical    Kathleen Schneider ASN:[id number] DOB: 12/21/1940 DOA: 03/20/2020  PCP: Donald Prose, MD   Patient coming from: Home   Chief Complaint: SOB, cough   HPI: Kathleen Schneider is a 79 y.o. female with medical history significant for depression, chronic kidney disease stage IIIb, chronic diastolic CHF, anemia, and recent diagnosis of metastatic breast cancer, now presenting to the emergency department for evaluation of shortness of breath and cough.  Patient was discharged from the hospital 1 month ago after she was treated for pneumonia, required supplemental oxygen during the hospitalization, but completed a course of antibiotics and was stable on room air at time of discharge.  She was diagnosed with metastatic breast carcinoma during the admission, was not felt to be a candidate for systemic chemotherapy, but was planning to follow-up with oncology in the clinic this week for discussion of possible antiestrogen therapy.  Patient does not feel that she ever recovered from the recent pneumonia, continues to cough which is mainly nonproductive, continues to have fatigue and dyspnea with mild exertion, but denies any fevers, chills, chest pain, leg swelling or tenderness, or hemoptysis.  ED Course: Upon arrival to the ED, patient is found to be afebrile, saturating mid to upper 80s on room air, mildly tachypneic, heart rate around 100, and with stable blood pressure.  EKG features sinus rhythm with LAFB and LAD.  Chest x-ray is stable with no acute findings.  Chemistry panel notable for creatinine 1.39, similar to priors.  CBC demonstrates an improved normocytic anemia with hemoglobin 10.6.  BNP is now normal.  COVID-19 PCR is negative.  Patient was treated with Atrovent, albuterol, Solu-Medrol, and azithromycin.  She continues to drop her saturations to the mid to upper 80s on room air.   Review of Systems:  All other systems reviewed and apart from HPI, are negative.  Past Medical  History:  Diagnosis Date  . HTN (hypertension)   . Vision abnormalities     Past Surgical History:  Procedure Laterality Date  . CHOLECYSTECTOMY, LAPAROSCOPIC      Social History:   reports that she has quit smoking. She has never used smokeless tobacco. She reports current alcohol use. She reports that she does not use drugs.  No Known Allergies  Family History  Problem Relation Age of Onset  . Seizures Mother   . Alcoholism Father      Prior to Admission medications   Medication Sig Start Date End Date Taking? Authorizing Provider  escitalopram (LEXAPRO) 10 MG tablet Take 1 tablet (10 mg total) by mouth daily. 02/19/20  Yes Cherene Altes, MD  lamoTRIgine (LAMICTAL) 150 MG tablet Take 1 tablet (150 mg total) by mouth 2 (two) times daily. 02/18/20  Yes Cherene Altes, MD  lidocaine (LIDODERM) 5 % Place 2 patches onto the skin daily. Remove & Discard patch within 12 hours or as directed by MD 02/19/20  Yes Cherene Altes, MD  meloxicam (MOBIC) 7.5 MG tablet Take 1 tablet (7.5 mg total) by mouth daily as needed for pain. 02/18/20  Yes Cherene Altes, MD  oxybutynin (DITROPAN) 5 MG tablet Take 1 tablet (5 mg total) by mouth 2 (two) times daily. 02/18/20  Yes Cherene Altes, MD  acetaminophen (TYLENOL) 500 MG tablet Take 1 tablet (500 mg total) by mouth every 6 (six) hours as needed for mild pain, fever or headache. Patient not taking: Reported on 03/20/2020 02/18/20   Cherene Altes, MD  albuterol (VENTOLIN HFA)  108 (90 Base) MCG/ACT inhaler Inhale 2 puffs into the lungs every 4 (four) hours as needed for wheezing or shortness of breath. 03/20/20   Lajean Saver, MD  azithromycin (ZITHROMAX Z-PAK) 250 MG tablet Take 1 tablet (250 mg total) by mouth daily for 4 days. 03/21/20 03/25/20  Lajean Saver, MD  predniSONE (DELTASONE) 20 MG tablet Take 2 tablets (40 mg total) by mouth daily. 03/21/20   Lajean Saver, MD    Physical Exam: Vitals:   03/20/20 2339 03/21/20 0002  03/21/20 0023 03/21/20 0025  BP: 136/80   (!) 109/55  Pulse: 95 94 95 96  Resp: 12 17 18 16   Temp:      TempSrc:      SpO2: 94% (!) 87% 95% 95%  Weight:      Height:         Constitutional: NAD, calm  Eyes: PERTLA, lids and conjunctivae normal ENMT: Mucous membranes are moist. Posterior pharynx clear of any exudate or lesions.   Neck: normal, supple, no masses, no thyromegaly Respiratory: Expiratory wheeze. Speaking full sentences. No accessory muscle use.  Cardiovascular: S1 & S2 heard, regular rate and rhythm. No extremity edema.  Abdomen: No distension, no tenderness, soft. Bowel sounds active.  Musculoskeletal: no clubbing / cyanosis. No joint deformity upper and lower extremities.   Skin: no significant rashes, lesions, ulcers. Warm, dry, well-perfused. Neurologic: CN 2-12 grossly intact. Sensation intact. Moving all extremities.  Psychiatric: Sleeping, easily woken. Oriented to person, place, and situation. Pleasant and cooperative.    Labs and Imaging on Admission: I have personally reviewed following labs and imaging studies  CBC: Recent Labs  Lab 03/20/20 2215  WBC 6.9  HGB 10.6*  HCT 35.1*  MCV 92.6  PLT 295   Basic Metabolic Panel: Recent Labs  Lab 03/20/20 2215  NA 135  K 4.1  CL 101  CO2 22  GLUCOSE 157*  BUN 20  CREATININE 1.39*  CALCIUM 8.9   GFR: Estimated Creatinine Clearance: 31.8 mL/min (A) (by C-G formula based on SCr of 1.39 mg/dL (H)). Liver Function Tests: Recent Labs  Lab 03/20/20 2215  AST 12*  ALT 8  ALKPHOS 64  BILITOT 0.4  PROT 7.9  ALBUMIN 3.5   No results for input(s): LIPASE, AMYLASE in the last 168 hours. No results for input(s): AMMONIA in the last 168 hours. Coagulation Profile: No results for input(s): INR, PROTIME in the last 168 hours. Cardiac Enzymes: No results for input(s): CKTOTAL, CKMB, CKMBINDEX, TROPONINI in the last 168 hours. BNP (last 3 results) No results for input(s): PROBNP in the last 8760  hours. HbA1C: No results for input(s): HGBA1C in the last 72 hours. CBG: No results for input(s): GLUCAP in the last 168 hours. Lipid Profile: No results for input(s): CHOL, HDL, LDLCALC, TRIG, CHOLHDL, LDLDIRECT in the last 72 hours. Thyroid Function Tests: No results for input(s): TSH, T4TOTAL, FREET4, T3FREE, THYROIDAB in the last 72 hours. Anemia Panel: No results for input(s): VITAMINB12, FOLATE, FERRITIN, TIBC, IRON, RETICCTPCT in the last 72 hours. Urine analysis:    Component Value Date/Time   COLORURINE AMBER (A) 02/03/2020 1812   APPEARANCEUR HAZY (A) 02/03/2020 1812   LABSPEC 1.020 02/03/2020 1812   PHURINE 5.0 02/03/2020 1812   GLUCOSEU NEGATIVE 02/03/2020 1812   HGBUR MODERATE (A) 02/03/2020 1812   BILIRUBINUR NEGATIVE 02/03/2020 1812   KETONESUR 5 (A) 02/03/2020 1812   PROTEINUR 100 (A) 02/03/2020 1812   NITRITE NEGATIVE 02/03/2020 1812   LEUKOCYTESUR TRACE (A) 02/03/2020 1812  Sepsis Labs: @LABRCNTIP (procalcitonin:4,lacticidven:4) ) Recent Results (from the past 240 hour(s))  Resp Panel by RT-PCR (Flu A&B, Covid) Nasopharyngeal Swab     Status: None   Collection Time: 03/20/20  9:32 PM   Specimen: Nasopharyngeal Swab; Nasopharyngeal(NP) swabs in vial transport medium  Result Value Ref Range Status   SARS Coronavirus 2 by RT PCR NEGATIVE NEGATIVE Final    Comment: (NOTE) SARS-CoV-2 target nucleic acids are NOT DETECTED.  The SARS-CoV-2 RNA is generally detectable in upper respiratory specimens during the acute phase of infection. The lowest concentration of SARS-CoV-2 viral copies this assay can detect is 138 copies/mL. A negative result does not preclude SARS-Cov-2 infection and should not be used as the sole basis for treatment or other patient management decisions. A negative result may occur with  improper specimen collection/handling, submission of specimen other than nasopharyngeal swab, presence of viral mutation(s) within the areas targeted by this  assay, and inadequate number of viral copies(<138 copies/mL). A negative result must be combined with clinical observations, patient history, and epidemiological information. The expected result is Negative.  Fact Sheet for Patients:  EntrepreneurPulse.com.au  Fact Sheet for Healthcare Providers:  IncredibleEmployment.be  This test is no t yet approved or cleared by the [place] FDA and  has been authorized for detection and/or diagnosis of SARS-CoV-2 by FDA under an Emergency Use Authorization (EUA). This EUA will remain  in effect (meaning this test can be used) for the duration of the COVID-19 declaration under Section 564(b)(1) of the Act, 21 U.S.C.section 360bbb-3(b)(1), unless the authorization is terminated  or revoked sooner.       Influenza A by PCR NEGATIVE NEGATIVE Final   Influenza B by PCR NEGATIVE NEGATIVE Final    Comment: (NOTE) The Xpert Xpress SARS-CoV-2/FLU/RSV plus assay is intended as an aid in the diagnosis of influenza from Nasopharyngeal swab specimens and should not be used as a sole basis for treatment. Nasal washings and aspirates are unacceptable for Xpert Xpress SARS-CoV-2/FLU/RSV testing.  Fact Sheet for Patients: EntrepreneurPulse.com.au  Fact Sheet for Healthcare Providers: IncredibleEmployment.be  This test is not yet approved or cleared by the [place] FDA and has been authorized for detection and/or diagnosis of SARS-CoV-2 by FDA under an Emergency Use Authorization (EUA). This EUA will remain in effect (meaning this test can be used) for the duration of the COVID-19 declaration under Section 564(b)(1) of the Act, 21 U.S.C. section 360bbb-3(b)(1), unless the authorization is terminated or revoked.  Performed at [facility], [place] [address] [place], [place] [address]      Radiological Exams on Admission: DG Chest Port 1 View  Result  Date: 03/20/2020 CLINICAL DATA:  Cough and short of breath, hypoxia, history of breast cancer EXAM: PORTABLE CHEST 1 VIEW COMPARISON:  02/07/2020, 02/10/2020 FINDINGS: Single frontal view of the chest demonstrates an unremarkable cardiac silhouette. Mild diffuse interstitial prominence unchanged. No acute airspace disease, effusion, or pneumothorax. The sclerotic bony lesions seen on CT are less apparent by radiograph. No acute fracture. IMPRESSION: 1. Stable chest, no acute airspace disease. 2. Sclerotic bony metastases seen on previous CTs are less apparent by radiograph. Electronically Signed   By: Randa Ngo M.D.   On: 03/20/2020 22:01    EKG: Independently reviewed. Sinus rhythm, LAFB, LAD.   Assessment/Plan   1. Acute hypoxic respiratory failure  - Presents with SOB, cough, and wheezing and is found to have new supplemental O2 requirement  - She had a CT during admission in late October with  bronchial wall thickening, tree-in-bud nodularity, and small airway impaction suspected secondary to infection, required supplemental O2 during the admission, completed a course of antibiotics, and went home on rm air but does not feel that she ever recovered  - COVID and influenza negative in ED, no fever or leukocytosis present, no chest pain, leg swelling, or hemoptysis but resting HR has been ~100 and malignancy puts her at increased risk for PE  - Check procalcitonin and d-dimer, continue steroids and bronchodilators, and continue supplemental O2 as needed    2. Metastatic breast cancer  - Recent diagnosis, not a candidate for systemic chemotherapy per oncology notes, was planning to see oncology in the clinic this week to discuss possible antiestrogen therapy    3. Chronic diastolic CHF  - Appears compensated  - BNP now normal, was elevated last admission  - Monitor volume status    4. Anemia  - Hgb is 10.6 on admission  - Improved from last month, no bleeding reported    5. CKD IIIb -  SCr is 1.39 on admission, close to her apparent baseline  - Renally-dose medications, monitor    6. Depression  - Continue Lexapro     DVT prophylaxis: sq heparin  Code Status: DNR Family Communication: Discussed with patient  Disposition Plan:  Patient is from: Home  Anticipated d/c is to: TBD Anticipated d/c date is: ~03/22/20 Patient currently: pending improvement in oxygenation, may need home O2 Consults called: None  Admission status: Observation     Vianne Bulls, MD [facility]  03/21/2020, 12:41 AM

## 2020-03-21 NOTE — Hospital Course (Addendum)
Kathleen Schneider is a 79 y.o. female with medical history significant for depression, CKD stage IIIb, chronic diastolic CHF, anemia, and recent diagnosis of metastatic breast cancer who presented to the ER with worsening shortness of breath and cough at home.  She had recently been treated approximately 1 month ago for pneumonia and required oxygen during that hospitalization but after completing antibiotics, she was stable on room air at home at discharge. She is significantly anxious and exhibits ongoing depression, mostly situational with her recent diagnosis. She also endorsed a sick contact at home, endorsing one of her aides was sick and coughing around her.   Upon arrival to the ED, patient is found to be afebrile, saturating mid to upper 80s on room air, mildly tachypneic, heart rate around 100, and with stable blood pressure.  EKG features sinus rhythm with LAFB and LAD.  Chest x-ray is stable with no acute findings.  Chemistry panel notable for creatinine 1.39, similar to priors.  CBC demonstrates an improved normocytic anemia with hemoglobin 10.6.  BNP is now normal.  COVID-19 PCR is negative.  Patient was treated with Atrovent, albuterol, Solu-Medrol, and azithromycin.  She continues to drop her saturations to the mid to upper 80s on room air.  D-dimer was also positive.  Procalcitonin negative. She was started on fluids and ordered to undergo CTA chest to rule out PE. The CTA chest was negative for PE. An RVP was obtained also and was positive for metapneumovirus. She was informed and recommended for supportive care.  PT/OT were also consulted due to her frequent falls at home and having difficulty with affording nurse aides at times.  SNF was recommended and patient was amenable with this if able to be approved by insurance.  She was denied again from insurance for approval for rehab.  Palliative care was also consulted while she was hospitalized.  After conversation, tentative plan is for  patient to discharge home with hospice in place and possibly private duty custodial care.  She was also arranged for EMS transport to home at time of discharge given her inability to ambulate up steps as well as enough distance from her door to her room.

## 2020-03-21 NOTE — Assessment & Plan Note (Signed)
-  baseline approx 1.4-1.5, currently at baseline - IVF in anticipation of CTA

## 2020-03-21 NOTE — Assessment & Plan Note (Addendum)
-  Stage IV metastatic breast cancer with diffuse sclerotic bone lesion -Recent diagnosis. Concerned about treatment candidacy from oncology perspective due to poor performance status and poor social support - needs PT eval, as patient may not be safe for home; oncology also concerned - oncology also recommending palliative care/hospice; patient has a component of denial but more accepting of conversations as they were repeated - PT recommending SNF; denied for rehab from insurance -Patient met with palliative care on 03/25/2020.  Plan is to discharge home with hospice in place and ongoing private care

## 2020-03-21 NOTE — Assessment & Plan Note (Addendum)
-  Presents with SOB, cough, and wheezing and is found to have new supplemental O2 requirement -She had a CT during admission in late October with bronchial wall thickening, tree-in-bud nodularity, and small airway impaction suspected secondary to infection, required supplemental O2 during the admission, completed a course of antibiotics, and went home on room air but does not feel that she ever recovered -COVID and influenza negative in ED - check CTA chest = negative for PE - see bronchitis

## 2020-03-21 NOTE — Progress Notes (Addendum)
PROGRESS NOTE    BEVA REMUND   PFX:[id number]  DOB: 03/20/1941  DOA: 03/20/2020     0  PCP: Donald Prose, MD  CC: SOB  Hospital Course: SWAYZEE WADLEY is a 79 y.o. female with medical history significant for depression, CKD stage IIIb, chronic diastolic CHF, anemia, and recent diagnosis of metastatic breast cancer who presented to the ER with worsening shortness of breath and cough at home.  She had recently been treated approximately 1 month ago for pneumonia and required oxygen during that hospitalization but after completing antibiotics, she was stable on room air at home at discharge. She is significantly anxious and exhibits ongoing depression, mostly situational with her recent diagnosis. She also endorsed a sick contact at home, endorsing one of her aides was sick and coughing around her.  Upon arrival to the ED, patient is found to be afebrile, saturating mid to upper 80s on room air, mildly tachypneic, heart rate around 100, and with stable blood pressure.  EKG features sinus rhythm with LAFB and LAD.  Chest x-ray is stable with no acute findings.  Chemistry panel notable for creatinine 1.39, similar to priors.  CBC demonstrates an improved normocytic anemia with hemoglobin 10.6.  BNP is now normal.  COVID-19 PCR is negative.  Patient was treated with Atrovent, albuterol, Solu-Medrol, and azithromycin.  She continues to drop her saturations to the mid to upper 80s on room air.  D-dimer was also positive.  Procalcitonin negative. She was started on fluids and ordered to undergo CTA chest to rule out PE.   Interval History:  Seen in the ER this morning waiting on her room still.  She tried over any information given even when told about her CKD stage III (stating she knew nothing about this); as I was trying to instruct her that we would start fluids while ordering CTA chest as ruling out or in a PE was important at this point in the setting of her underlying malignancy and  recurrent hypoxia.  She became tearful and upset which seemed out of proportion especially when talking about other unrelated medical problems. She told me to bring her "good news" the next time I come back and talk to her.  Old records reviewed in assessment of this patient  ROS: Constitutional: positive for fatigue and malaise, Respiratory: positive for cough and dyspnea on exertion, Cardiovascular: negative for chest pain and Gastrointestinal: negative for abdominal pain  Assessment & Plan: Acute hypoxic respiratory failure  - Presents with SOB, cough, and wheezing and is found to have new supplemental O2 requirement  - She had a CT during admission in late October with bronchial wall thickening, tree-in-bud nodularity, and small airway impaction suspected secondary to infection, required supplemental O2 during the admission, completed a course of antibiotics, and went home on rm air but does not feel that she ever recovered  - COVID and influenza negative in ED, no fever or leukocytosis present, no chest pain, leg swelling, or hemoptysis but resting HR has been ~100 and malignancy puts her at increased risk for PE  - check CTA chest given increased suspicion (dimer elevated but also possibly just acute phase reactant) - continue steroids, abx, and nebs until CTA done - obtain RVP  Metastatic breast cancer  - Recent diagnosis. Concerned about treatment candidacy from oncology perspective due to poor performance status and poor social support - needs PT eval, as patient may not be safe for home; oncology also concerned    Chronic diastolic  CHF  - Appears compensated  - BNP now normal, was elevated last admission  - Monitor volume status    Anemia  - Hgb is 10.6 on admission  - Improved from last month, no bleeding reported    CKD IIIb - baseline approx 1.4-1.5, currently at baseline - IVF in anticipation of CTA  Depression  - Continue Lexapro    Antimicrobials: Azithro  12/6>> present  DVT prophylaxis: HSQ Code Status: DNR Family Communication: none present Disposition Plan: Status is: Inpatient  Remains inpatient appropriate because:Ongoing diagnostic testing needed not appropriate for outpatient work up, IV treatments appropriate due to intensity of illness or inability to take PO and Inpatient level of care appropriate due to severity of illness   Dispo: The patient is from: Home              Anticipated d/c is to: pending PT eval              Anticipated d/c date is: 3 days              Patient currently is not medically stable to d/c.  Objective: Blood pressure (!) 162/89, pulse 73, temperature 97.8 F (36.6 C), temperature source Oral, resp. rate 14, height 5\' 4"  (1.626 m), weight 73.9 kg, SpO2 97 %.  Examination: General appearance: depressed appearing elderly woman constantly crying during conversation with any discussion about her diagnoses. No distress Head: Normocephalic, without obvious abnormality, atraumatic Eyes: EOMI Lungs: no obvious wheezing or rhonchi nor crackles Heart: regular rate and rhythm and S1, S2 normal Abdomen: normal findings: bowel sounds normal and soft, non-tender Extremities: trace LE edema Skin: mobility and turgor normal Neurologic: Grossly normal  Consultants:   Oncology  Procedures:     Data Reviewed: I have personally reviewed following labs and imaging studies Results for orders placed or performed during the hospital encounter of 03/20/20 (from the past 24 hour(s))  Resp Panel by RT-PCR (Flu A&B, Covid) Nasopharyngeal Swab     Status: None   Collection Time: 03/20/20  9:32 PM   Specimen: Nasopharyngeal Swab; Nasopharyngeal(NP) swabs in vial transport medium  Result Value Ref Range   SARS Coronavirus 2 by RT PCR NEGATIVE NEGATIVE   Influenza A by PCR NEGATIVE NEGATIVE   Influenza B by PCR NEGATIVE NEGATIVE  Brain natriuretic peptide     Status: None   Collection Time: 03/20/20 10:15 PM  Result  Value Ref Range   B Natriuretic Peptide 71.4 0.0 - 100.0 pg/mL  CBC     Status: Abnormal   Collection Time: 03/20/20 10:15 PM  Result Value Ref Range   WBC 6.9 4.0 - 10.5 K/uL   RBC 3.79 (L) 3.87 - 5.11 MIL/uL   Hemoglobin 10.6 (L) 12.0 - 15.0 g/dL   HCT 35.1 (L) 36 - 46 %   MCV 92.6 80.0 - 100.0 fL   MCH 28.0 26.0 - 34.0 pg   MCHC 30.2 30.0 - 36.0 g/dL   RDW 15.0 11.5 - 15.5 %   Platelets 278 150 - 400 K/uL   nRBC 0.0 0.0 - 0.2 %  Comprehensive metabolic panel     Status: Abnormal   Collection Time: 03/20/20 10:15 PM  Result Value Ref Range   Sodium 135 135 - 145 mmol/L   Potassium 4.1 3.5 - 5.1 mmol/L   Chloride 101 98 - 111 mmol/L   CO2 22 22 - 32 mmol/L   Glucose, Bld 157 (H) 70 - 99 mg/dL   BUN 20 8 -  23 mg/dL   Creatinine, Ser 1.39 (H) 0.44 - 1.00 mg/dL   Calcium 8.9 8.9 - 10.3 mg/dL   Total Protein 7.9 6.5 - 8.1 g/dL   Albumin 3.5 3.5 - 5.0 g/dL   AST 12 (L) 15 - 41 U/L   ALT 8 0 - 44 U/L   Alkaline Phosphatase 64 38 - 126 U/L   Total Bilirubin 0.4 0.3 - 1.2 mg/dL   GFR, Estimated 38 (L) >60 mL/min   Anion gap 12 5 - 15  Procalcitonin - Baseline     Status: None   Collection Time: 03/21/20 12:36 AM  Result Value Ref Range   Procalcitonin <0.10 ng/mL  D-dimer, quantitative (not at [facility])     Status: Abnormal   Collection Time: 03/21/20 12:36 AM  Result Value Ref Range   D-Dimer, Quant 1.54 (H) 0.00 - 0.50 ug/mL-FEU  Basic metabolic panel     Status: Abnormal   Collection Time: 03/21/20  4:17 AM  Result Value Ref Range   Sodium 134 (L) 135 - 145 mmol/L   Potassium 5.0 3.5 - 5.1 mmol/L   Chloride 100 98 - 111 mmol/L   CO2 21 (L) 22 - 32 mmol/L   Glucose, Bld 207 (H) 70 - 99 mg/dL   BUN 23 8 - 23 mg/dL   Creatinine, Ser 1.51 (H) 0.44 - 1.00 mg/dL   Calcium 8.5 (L) 8.9 - 10.3 mg/dL   GFR, Estimated 35 (L) >60 mL/min   Anion gap 13 5 - 15  CBC     Status: Abnormal   Collection Time: 03/21/20  4:17 AM  Result Value Ref Range   WBC 5.2 4.0 - 10.5 K/uL   RBC  3.63 (L) 3.87 - 5.11 MIL/uL   Hemoglobin 10.4 (L) 12.0 - 15.0 g/dL   HCT 33.6 (L) 36 - 46 %   MCV 92.6 80.0 - 100.0 fL   MCH 28.7 26.0 - 34.0 pg   MCHC 31.0 30.0 - 36.0 g/dL   RDW 15.0 11.5 - 15.5 %   Platelets 276 150 - 400 K/uL   nRBC 0.0 0.0 - 0.2 %    Recent Results (from the past 240 hour(s))  Resp Panel by RT-PCR (Flu A&B, Covid) Nasopharyngeal Swab     Status: None   Collection Time: 03/20/20  9:32 PM   Specimen: Nasopharyngeal Swab; Nasopharyngeal(NP) swabs in vial transport medium  Result Value Ref Range Status   SARS Coronavirus 2 by RT PCR NEGATIVE NEGATIVE Final    Comment: (NOTE) SARS-CoV-2 target nucleic acids are NOT DETECTED.  The SARS-CoV-2 RNA is generally detectable in upper respiratory specimens during the acute phase of infection. The lowest concentration of SARS-CoV-2 viral copies this assay can detect is 138 copies/mL. A negative result does not preclude SARS-Cov-2 infection and should not be used as the sole basis for treatment or other patient management decisions. A negative result may occur with  improper specimen collection/handling, submission of specimen other than nasopharyngeal swab, presence of viral mutation(s) within the areas targeted by this assay, and inadequate number of viral copies(<138 copies/mL). A negative result must be combined with clinical observations, patient history, and epidemiological information. The expected result is Negative.  Fact Sheet for Patients:  EntrepreneurPulse.com.au  Fact Sheet for Healthcare Providers:  IncredibleEmployment.be  This test is no t yet approved or cleared by the [place] FDA and  has been authorized for detection and/or diagnosis of SARS-CoV-2 by FDA under an Emergency Use Authorization (EUA). This EUA will remain  in effect (meaning this test can be used) for the duration of the COVID-19 declaration under Section 564(b)(1) of the Act, 21 U.S.C.section  360bbb-3(b)(1), unless the authorization is terminated  or revoked sooner.       Influenza A by PCR NEGATIVE NEGATIVE Final   Influenza B by PCR NEGATIVE NEGATIVE Final    Comment: (NOTE) The Xpert Xpress SARS-CoV-2/FLU/RSV plus assay is intended as an aid in the diagnosis of influenza from Nasopharyngeal swab specimens and should not be used as a sole basis for treatment. Nasal washings and aspirates are unacceptable for Xpert Xpress SARS-CoV-2/FLU/RSV testing.  Fact Sheet for Patients: EntrepreneurPulse.com.au  Fact Sheet for Healthcare Providers: IncredibleEmployment.be  This test is not yet approved or cleared by the [place] FDA and has been authorized for detection and/or diagnosis of SARS-CoV-2 by FDA under an Emergency Use Authorization (EUA). This EUA will remain in effect (meaning this test can be used) for the duration of the COVID-19 declaration under Section 564(b)(1) of the Act, 21 U.S.C. section 360bbb-3(b)(1), unless the authorization is terminated or revoked.  Performed at [facility], [place] [address], [place], [place] [address]      Radiology Studies: DG Chest Port 1 View  Result Date: 03/20/2020 CLINICAL DATA:  Cough and short of breath, hypoxia, history of breast cancer EXAM: PORTABLE CHEST 1 VIEW COMPARISON:  02/07/2020, 02/10/2020 FINDINGS: Single frontal view of the chest demonstrates an unremarkable cardiac silhouette. Mild diffuse interstitial prominence unchanged. No acute airspace disease, effusion, or pneumothorax. The sclerotic bony lesions seen on CT are less apparent by radiograph. No acute fracture. IMPRESSION: 1. Stable chest, no acute airspace disease. 2. Sclerotic bony metastases seen on previous CTs are less apparent by radiograph. Electronically Signed   By: Randa Ngo M.D.   On: 03/20/2020 22:01   DG Chest Port 1 View  Final Result    CT ANGIO CHEST PE W OR WO CONTRAST     (Results Pending)    Scheduled Meds: . escitalopram  10 mg Oral Daily  . heparin  5,000 Units Subcutaneous Q8H  . lamoTRIgine  150 mg Oral BID  . oxybutynin  5 mg Oral BID  . predniSONE  50 mg Oral Q breakfast  . sodium chloride (PF)      . sodium chloride flush  3 mL Intravenous Q12H   PRN Meds: sodium chloride, acetaminophen **OR** acetaminophen, albuterol, ondansetron **OR** ondansetron (ZOFRAN) IV, polyethylene glycol, sodium chloride flush Continuous Infusions: . sodium chloride    . sodium chloride 100 mL/hr at 03/21/20 1354     LOS: 0 days  Time spent: Greater than 50% of the 35 minute visit was spent in counseling/coordination of care for the patient as laid out in the A&P.   Dwyane Dee, MD [facility] 03/21/2020, 5:18 PM

## 2020-03-21 NOTE — Progress Notes (Addendum)
HEMATOLOGY-ONCOLOGY PROGRESS NOTE  Patient Care Team: Donald Prose, MD as PCP - General (Family Medicine)   I have seen the patient today and agree with the documentation as follows ASSESSMENT & PLAN:    Diffuse sclerotic bone lesion, due to stage IV metastatic breast cancer to the bone CT-guided bone marrow aspirate and biopsy were obtained on 02/12/2020 and results consistent with metastatic breast carcinoma Biopsy results of been discussed with the patient from previous visit Due to her poor performance status, she is not a candidate for systemic chemotherapy but we can consider her for antiestrogen therapy such as tamoxifen or an aromatase inhibitor She was scheduled to see me in the office today to discuss treatment options The patient seemed to be unaware of this appointment despite the fact that our office has called to remind her Given her poor performance status and poor social support, I am concerned that she is not a candidate for treatment for her metastatic breast cancer Recommend palliative care/hospice  Anemia Multifactorial, likely due to recent infection and anemia chronic kidney disease.  Bone marrow infiltration from malignancy can also cause her anemia She has no evidence of iron deficiency anemia or B12 deficiency Observe only for now She does not need transfusion support   Respiratory failure secondary to pneumonia On O2 and received a one-time dose of antibiotics in the ER Remains on steroids and bronchodilators Management per hospitalist  Left-sided hydronephrosis Etiology unclear Renal function stable The patient was seen by urology and no intervention was recommended Continue to trend renal function  Goals of Care The patient has metastatic breast cancer  She is not a candidate for chemotherapy due to poor performance status Additionally, she has continued to decline and has poor social support and is therefore not a candidate for antiestrogen  therapy Recommend palliative care/hospice  Discharge planning Recommend care management/social work consult Has an aide coming into her home for several hours a day but expresses concern over affording this; according to the patient, she fell 3 times at home Did not qualify for SNF during last hospitalization and unsure if she would qualify for long-term care placement I reviewed the plan of care with primary service I will sign off  All questions were answered. The patient knows to call the clinic with any problems, questions or concerns. No barriers to learning was detected.  Mikey Bussing, DNP, AGPCNP-BC, AOCNP Heath Lark, MD  INTERVAL HISTORY: The patient was seen in the emergency room today Was admitted due to acute hypoxic respiratory failure Respiratory status is stable at the time my visit Patient tells me that she came into the emergency room because she fell at home She states that she did not have any help to get up and was crawling around on the floor -timeline of events is unclear to me Denies bony pain She denies major injuries She was very upset She started telling me the story of her recurrent hospitalization over the last few months According to the patient, since recent discharge from the hospital, she was completely bedbound She could not get up to get to the bathroom and wear an adult "depends", change whenever and it comes to her house Financially, she felt it was too costly to get an 8 round-the-clock and she try to cut back on the payment. She started crying when she told me her home situation today  REVIEW OF SYSTEMS:   Constitutional: Denies fevers, chills Eyes: Denies blurriness of vision Ears, nose, mouth, throat, and face: Denies  mucositis or sore throat Respiratory: Denies cough and shortness of breath  Cardiovascular: Denies palpitation, chest discomfort Gastrointestinal: Denies nausea and vomiting  Skin: Denies abnormal skin rashes Lymphatics:  Denies new lymphadenopathy or easy bruising Neurological:Denies numbness, tingling or new weaknesses Behavioral/Psych: Mood is stable, no new changes  Extremities: No lower extremity edema All other systems were reviewed with the patient and are negative.  I have reviewed the past medical history, past surgical history, social history and family history with the patient and they are unchanged from previous note.   PHYSICAL EXAMINATION: ECOG PERFORMANCE STATUS: 4 - Bedbound  Vitals:   03/21/20 1100 03/21/20 1130  BP: 137/74 136/82  Pulse: 74 69  Resp: 19 (!) 21  Temp:    SpO2: 95% 97%   Filed Weights   03/20/20 2125  Weight: 73.9 kg    Intake/Output from previous day: No intake/output data recorded.  GENERAL: Elderly female who appears frail, tearful SKIN: skin color, texture, turgor are normal, no rashes or significant lesions EYES: normal, Conjunctiva are pink and non-injected, sclera clear OROPHARYNX:no exudate, no erythema and lips, buccal mucosa, and tongue normal  NECK: supple, thyroid normal size, non-tender, without nodularity LYMPH:  no palpable lymphadenopathy in the cervical, axillary or inguinal LUNGS: clear to auscultation and percussion with normal breathing effort HEART: regular rate & rhythm and no murmurs and no lower extremity edema ABDOMEN:abdomen soft, non-tender and normal bowel sounds NEURO: Alert and oriented, very tearful during my visit  LABORATORY DATA:  I have reviewed the data as listed CMP Latest Ref Rng & Units 03/21/2020 03/20/2020 02/17/2020  Glucose 70 - 99 mg/dL 207(H) 157(H) 104(H)  BUN 8 - 23 mg/dL 23 20 12   Creatinine 0.44 - 1.00 mg/dL 1.51(H) 1.39(H) 1.34(H)  Sodium 135 - 145 mmol/L 134(L) 135 135  Potassium 3.5 - 5.1 mmol/L 5.0 4.1 4.1  Chloride 98 - 111 mmol/L 100 101 96(L)  CO2 22 - 32 mmol/L 21(L) 22 32  Calcium 8.9 - 10.3 mg/dL 8.5(L) 8.9 8.9  Total Protein 6.5 - 8.1 g/dL - 7.9 -  Total Bilirubin 0.3 - 1.2 mg/dL - 0.4 -   Alkaline Phos 38 - 126 U/L - 64 -  AST 15 - 41 U/L - 12(L) -  ALT 0 - 44 U/L - 8 -    Lab Results  Component Value Date   WBC 5.2 03/21/2020   HGB 10.4 (L) 03/21/2020   HCT 33.6 (L) 03/21/2020   MCV 92.6 03/21/2020   PLT 276 03/21/2020   NEUTROABS 6.8 02/12/2020   I have personally reviewed her CT imaging DG Chest Port 1 View  Result Date: 03/20/2020 CLINICAL DATA:  Cough and short of breath, hypoxia, history of breast cancer EXAM: PORTABLE CHEST 1 VIEW COMPARISON:  02/07/2020, 02/10/2020 FINDINGS: Single frontal view of the chest demonstrates an unremarkable cardiac silhouette. Mild diffuse interstitial prominence unchanged. No acute airspace disease, effusion, or pneumothorax. The sclerotic bony lesions seen on CT are less apparent by radiograph. No acute fracture. IMPRESSION: 1. Stable chest, no acute airspace disease. 2. Sclerotic bony metastases seen on previous CTs are less apparent by radiograph. Electronically Signed   By: Randa Ngo M.D.   On: 03/20/2020 22:01    LOS: 0 days

## 2020-03-21 NOTE — Assessment & Plan Note (Signed)
-  Hgb is 10.6 on admission -Improved from last month, no bleeding reported

## 2020-03-21 NOTE — Assessment & Plan Note (Signed)
Continue Lexapro

## 2020-03-21 NOTE — Assessment & Plan Note (Signed)
-  Appears compensated -BNP now normal, was elevated last admission -Monitor volume status

## 2020-03-22 DIAGNOSIS — B9781 Human metapneumovirus as the cause of diseases classified elsewhere: Secondary | ICD-10-CM

## 2020-03-22 DIAGNOSIS — J208 Acute bronchitis due to other specified organisms: Principal | ICD-10-CM

## 2020-03-22 LAB — CBC WITH DIFFERENTIAL/PLATELET
Abs Immature Granulocytes: 0.06 10*3/uL (ref 0.00–0.07)
Basophils Absolute: 0 10*3/uL (ref 0.0–0.1)
Basophils Relative: 0 %
Eosinophils Absolute: 0 10*3/uL (ref 0.0–0.5)
Eosinophils Relative: 0 %
HCT: 29.4 % — ABNORMAL LOW (ref 36.0–46.0)
Hemoglobin: 9.1 g/dL — ABNORMAL LOW (ref 12.0–15.0)
Immature Granulocytes: 1 %
Lymphocytes Relative: 15 %
Lymphs Abs: 1.2 10*3/uL (ref 0.7–4.0)
MCH: 28.4 pg (ref 26.0–34.0)
MCHC: 31 g/dL (ref 30.0–36.0)
MCV: 91.9 fL (ref 80.0–100.0)
Monocytes Absolute: 0.5 10*3/uL (ref 0.1–1.0)
Monocytes Relative: 7 %
Neutro Abs: 6.2 10*3/uL (ref 1.7–7.7)
Neutrophils Relative %: 77 %
Platelets: 275 10*3/uL (ref 150–400)
RBC: 3.2 MIL/uL — ABNORMAL LOW (ref 3.87–5.11)
RDW: 14.6 % (ref 11.5–15.5)
WBC: 8 10*3/uL (ref 4.0–10.5)
nRBC: 0 % (ref 0.0–0.2)

## 2020-03-22 LAB — MAGNESIUM: Magnesium: 2.3 mg/dL (ref 1.7–2.4)

## 2020-03-22 LAB — BASIC METABOLIC PANEL
Anion gap: 9 (ref 5–15)
BUN: 21 mg/dL (ref 8–23)
CO2: 23 mmol/L (ref 22–32)
Calcium: 8.1 mg/dL — ABNORMAL LOW (ref 8.9–10.3)
Chloride: 101 mmol/L (ref 98–111)
Creatinine, Ser: 1.24 mg/dL — ABNORMAL HIGH (ref 0.44–1.00)
GFR, Estimated: 44 mL/min — ABNORMAL LOW (ref >60)
Glucose, Bld: 118 mg/dL — ABNORMAL HIGH (ref 70–99)
Potassium: 4.6 mmol/L (ref 3.5–5.1)
Sodium: 133 mmol/L — ABNORMAL LOW (ref 135–145)

## 2020-03-22 LAB — PROCALCITONIN: Procalcitonin: <0.1 ng/mL

## 2020-03-22 NOTE — Progress Notes (Signed)
Droplet precautions d/c'd per infection prevention- Tara.

## 2020-03-22 NOTE — Assessment & Plan Note (Addendum)
-   likely explains her "cold like" symptoms and congestion - supportive care; no indication for abx or antivirals - d/c steroids - wean O2 as able; if unable needs O2 at discharge; weaned to room air prior to discharge

## 2020-03-22 NOTE — Progress Notes (Signed)
Lidocaine patches due to be removed at 1100, pt refused. MD aware.

## 2020-03-22 NOTE — Evaluation (Signed)
Physical Therapy Evaluation Patient Details Name: CECIA EGGE MRN: [id number] DOB: 04-Feb-1941 Today's Date: 03/22/2020   History of Present Illness  Pt is a 79 y.o. female with medical history significant for depression, CKD stage IIIb, chronic diastolic CHF, anemia, and recent diagnosis of metastatic breast cancer who presented to the ER with worsening shortness of breath and cough at home.  Pt admitted with acute resp failure with hypoxia due to acute bronchitis and metapneumovirus.  Clinical Impression   Pt admitted with above diagnosis. Pt was very tearful and crying throughout PT session due to declining health/mobility.  She required min A for transfers, is high fall risk, and fatigued easily with ambulation.  Pt does not have 24 hr support at home.  From PT perspective, pt unsafe to return home without 24 hr support.  Recommend SNF to progress mobility.  Noted last admission that insurance denied SNF - discussed other options with pt if unable to do SNF including increased support at home vs nursing home or ALF placement. Pt prefers to avoid long term placement.  Pt currently with functional limitations due to the deficits listed below (see PT Problem List). Pt will benefit from skilled PT to increase their independence and safety with mobility to allow discharge to the venue listed below.       Follow Up Recommendations SNF;Supervision/Assistance - 24 hour    Equipment Recommendations  Wheelchair cushion (measurements PT);Wheelchair (measurements PT)    Recommendations for Other Services       Precautions / Restrictions Precautions Precautions: Fall Precaution Comments: 2L O2 via [place] Restrictions Weight Bearing Restrictions: No      Mobility  Bed Mobility Overal bed mobility: Needs Assistance Bed Mobility: Supine to Sit     Supine to sit: Min guard;HOB elevated     General bed mobility comments: increased time with heavy use of bed rail    Transfers Overall  transfer level: Needs assistance Equipment used: Rolling walker (2 wheeled) Transfers: Sit to/from Stand Sit to Stand: Min assist Stand pivot transfers: Min assist       General transfer comment: Cues for safe hand placement; min A to stand; performed x 3  Ambulation/Gait Ambulation/Gait assistance: Min assist Gait Distance (Feet): 30 Feet (30' then 15') Assistive device: Rolling walker (2 wheeled) Gait Pattern/deviations: Step-to pattern;Decreased stride length;Shuffle Gait velocity: decreased   General Gait Details: mild unsteadiness requiring min A  Stairs            Wheelchair Mobility    Modified Rankin (Stroke Patients Only)       Balance Overall balance assessment: Needs assistance Sitting-balance support: No upper extremity supported;Feet supported Sitting balance-[name]: Good     Standing balance support: Bilateral upper extremity supported;During functional activity;Single extremity supported Standing balance-[name]: Poor Standing balance comment: Reliance on RW; performed toielting ADLs in standing but required use of grab bar , min A to steady and fatigued easily requiring return to sitting to finish                             Pertinent Vitals/Pain Pain Assessment: No/denies pain    Home Living Family/patient expects to be discharged to:: Private residence Living Arrangements: Alone Available Help at Discharge: Available PRN/intermittently;Personal care attendant;Friend(s) Type of Home: House Home Access: Stairs to enter Entrance Stairs-Rails:  (held onto metal shelf next to steps and door frame) Entrance Stairs-Number of Steps: 3 Home Layout: Two level;Able to live on main  level with bedroom/bathroom Home Equipment: Walker - 2 wheels;Cane - single point;Bedside commode      Prior Function Level of Independence: Needs assistance   Gait / Transfers Assistance Needed: Pt has been able to ambulate in home with RW  ADL's /  Homemaking Assistance Needed: Pt requiring assist for ADLs/IADLs recently  Comments: Reports 2 falls this past year; Pt had aides 7 days a week for 12 hr a day through [place] but had recently cut back to about 5 hr day (3 in am and 2 in pm); she was getting HH PT and OT     Hand Dominance   Dominant Hand: Right    Extremity/Trunk Assessment   Upper Extremity Assessment Upper Extremity Assessment: Generalized weakness    Lower Extremity Assessment Lower Extremity Assessment: LLE deficits/detail;RLE deficits/detail RLE Deficits / Details: ROM WFL; MMT 4/5 LLE Deficits / Details: ROM WFL; MMT 4/5    Cervical / Trunk Assessment Cervical / Trunk Assessment: Kyphotic  Communication   Communication: No difficulties  Cognition Arousal/Alertness: Awake/alert Behavior During Therapy:  (emotional/tearful) Overall Cognitive Status: Within Functional Limits for tasks assessed                                 General Comments: Pt was very emotional and cried throughout therapy session. She was upset over her declining health and mobility.  Wants to return home , became very tearful with discussion of rehab , ALF, or long term placement      General Comments General comments (skin integrity, edema, etc.): Educated on PT role , POC, and recommendations.  Discussed need for 24 hr supervision vs placement.    Exercises     Assessment/Plan    PT Assessment Patient needs continued PT services  PT Problem List Decreased strength;Decreased mobility;Decreased safety awareness;Decreased range of motion;Decreased activity tolerance;Cardiopulmonary status limiting activity;Decreased balance;Decreased knowledge of use of DME       PT Treatment Interventions DME instruction;Therapeutic activities;Modalities;Gait training;Therapeutic exercise;Patient/family education;Balance training;Functional mobility training    PT Goals (Current goals can be found in the Care Plan section)   Acute Rehab PT Goals Patient Stated Goal: To return home.  PT Goal Formulation: With patient Time For Goal Achievement: 04/05/20 Potential to Achieve Goals: Good    Frequency Min 3X/week   Barriers to discharge Decreased caregiver support      Co-evaluation               AM-PAC PT "6 Clicks" Mobility  Outcome Measure Help needed turning from your back to your side while in a flat bed without using bedrails?: A Little Help needed moving from lying on your back to sitting on the side of a flat bed without using bedrails?: A Little Help needed moving to and from a bed to a chair (including a wheelchair)?: A Little Help needed standing up from a chair using your arms (e.g., wheelchair or bedside chair)?: A Little Help needed to walk in hospital room?: A Little Help needed climbing 3-5 steps with a railing? : A Lot 6 Click Score: 17    End of Session Equipment Utilized During Treatment: Gait belt Activity Tolerance: Patient limited by fatigue Patient left: with chair alarm set;in chair;with call bell/phone within reach Nurse Communication: Mobility status PT Visit Diagnosis: Other abnormalities of gait and mobility (R26.89);Muscle weakness (generalized) (M62.81);History of falling (Z91.81)    Time: 6433-2951 PT Time Calculation (min) (ACUTE ONLY): 44 min  Charges:   PT Evaluation $PT Eval Moderate Complexity: 1 Mod PT Treatments $Gait Training: 8-22 mins $Therapeutic Activity: 8-22 mins        Abran Richard, PT Acute Rehab Services Pager [phone] [facility] 03/22/2020, 2:59 PM

## 2020-03-22 NOTE — Evaluation (Signed)
Occupational Therapy Evaluation Patient Details Name: Kathleen Schneider MRN: [id number] DOB: [birth year] Today's Date: 03/22/2020    History of Present Illness 79 y.o. female presenting with SOB and cough. Patient admitted with acute hypoxic respiratory failure. Patient previously admitted ~1 month ago and treated for pneumonia. PMHx significant for depression, CKD IIIb, chronic diastolic CHF, and recent diagnosis of metastatic breast cancer.    Clinical Impression   PTA patient was living alone in a private residence with hired help from [place] for ADLs/IADLs. Patient was using RW in home for functional mobility. Patient currently presents with deficits in endurance, strength, static/dynamic standing balance, and cardiopulmonary function requiring need for 2L supplemental O2 via [place]. Patient would benefit from continued acute OT services to maximize safety with self-care tasks, functional mobility and ADL transfers. Patient currently declining SNF placement and will need HHOT with 24hr supervision/assist if she is to d/c home.     Follow Up Recommendations  SNF;Home health OT;Supervision/Assistance - 24 hour (Patient declining SNF)    Equipment Recommendations  None recommended by OT    Recommendations for Other Services       Precautions / Restrictions Precautions Precautions: Fall Precaution Comments: 2L O2 via [place] Restrictions Weight Bearing Restrictions: No      Mobility Bed Mobility               General bed mobility comments: Seated in recliner upon entry.     Transfers Overall transfer level: Needs assistance Equipment used: Rolling walker (2 wheeled) Transfers: Sit to/from [facility] Sit to Stand: Min guard Stand pivot transfers: Min assist       General transfer comment: Min guard for sit to stand from low recliner for steadying. Min A for stand-pivot transfer to sink surface with lateral LOB.     Balance Overall balance assessment: Needs  assistance Sitting-balance support: No upper extremity supported;Feet supported Sitting balance-[name]: Fair     Standing balance support: Bilateral upper extremity supported;During functional activity Standing balance-[name]: Poor Standing balance comment: Heavy reliance on BUE on RW.                            ADL either performed or assessed with clinical judgement   ADL Overall ADL's : Needs assistance/impaired     Grooming: Set up;Sitting Grooming Details (indicate cue type and reason): Patient initially standing requiring Min A to maintain standing balance. Completed task in sitting 2/2 fatigue.              Lower Body Dressing: Minimal assistance;Sit to/from stand Lower Body Dressing Details (indicate cue type and reason): Patient able to doff/don footwear seated in recliner without assist and use of figure-4 position. Would likely require Min A to don underwear/pants in sitting/standing 2/2 decreased balance.  Toilet Transfer: Minimal Insurance claims handler Details (indicate cue type and reason): Simulated with transfer from recliner to sink.            General ADL Comments: Patient fatigues very quickly. Inability for stand for longer periods of time.      Vision Baseline Vision/History: No visual deficits Patient Visual Report: No change from baseline Vision Assessment?: No apparent visual deficits Additional Comments: Can correctly identify time on wall clock     Perception     Praxis      Pertinent Vitals/Pain Pain Assessment: No/denies pain     Hand Dominance Right   Extremity/Trunk Assessment Upper Extremity Assessment Upper Extremity  Assessment: Generalized weakness   Lower Extremity Assessment Lower Extremity Assessment: Defer to PT evaluation   Cervical / Trunk Assessment Cervical / Trunk Assessment: Normal   Communication Communication Communication: No difficulties   Cognition Arousal/Alertness:  Awake/alert Behavior During Therapy: WFL for tasks assessed/performed Overall Cognitive Status: Within Functional Limits for tasks assessed                                     General Comments       Exercises     Shoulder Instructions      Home Living Family/patient expects to be discharged to:: Private residence Living Arrangements: Alone Available Help at Discharge: Available PRN/intermittently;Personal care attendant;Friend(s) Type of Home: House Home Access: Stairs to enter [employer] of Steps: 3 Entrance Stairs-Rails:  (held onto metal shelfing next to step and door frame) Home Layout: Two level;Able to live on main level with bedroom/bathroom     Bathroom Shower/Tub: Occupational psychologist: Standard     Home Equipment: Environmental consultant - 2 wheels;Cane - single point;Bedside commode          Prior Functioning/Environment Level of Independence: Needs assistance  Gait / Transfers Assistance Needed: Pt has been able to ambulate in home with RW ADL's / Homemaking Assistance Needed: Pt requiring assist for ADLs/IADLs recently   Comments: Reports 2 falls this past year; Pt had aides 7 days a week for 12 hr a day through [place] but had recently cut back to about 5 hr day (3 in am and 2 in pm); she was getting HH PT and OT        OT Problem List: Decreased strength;Decreased activity tolerance;Impaired balance (sitting and/or standing);Decreased safety awareness;Decreased knowledge of use of DME or AE;Cardiopulmonary status limiting activity      OT Treatment/Interventions: Self-care/ADL training;Therapeutic exercise;Energy conservation;DME and/or AE instruction;Therapeutic activities;Patient/family education;Balance training    OT Goals(Current goals can be found in the care plan section) Acute Rehab OT Goals Patient Stated Goal: To return home.  OT Goal Formulation: With patient Time For Goal Achievement: 04/05/20 Potential to  Achieve Goals: Good ADL Goals Pt Will Perform Lower Body Dressing: with supervision;sit to/from stand Pt Will Transfer to Toilet: with supervision;ambulating Pt Will Perform Toileting - Clothing Manipulation and hygiene: with supervision;sit to/from stand Additional ADL Goal #1: Patient will recall and demo 3 energy conservation techniques in prep for ADLs.  OT Frequency: Min 2X/week   Barriers to D/C: Decreased caregiver support  Patient lives alone with PRN assist from paid staff.        Co-evaluation              AM-PAC OT "6 Clicks" Daily Activity     Outcome Measure Help from another person eating meals?: A Little Help from another person taking care of personal grooming?: A Little Help from another person toileting, which includes using toliet, bedpan, or urinal?: A Lot Help from another person bathing (including washing, rinsing, drying)?: A Lot Help from another person to put on and taking off regular upper body clothing?: None Help from another person to put on and taking off regular lower body clothing?: A Little 6 Click Score: 17   End of Session Equipment Utilized During Treatment: Gait belt;Rolling walker Nurse Communication: Mobility status  Activity Tolerance: Patient tolerated treatment well Patient left: in chair;with call bell/phone within reach;with chair alarm set  OT Visit Diagnosis: Unsteadiness on  feet (R26.81);Muscle weakness (generalized) (M62.81);Repeated falls (R29.6)                Time: 4707-6151 OT Time Calculation (min): 31 min Charges:  OT General Charges $OT Visit: 1 Visit OT Evaluation $OT Eval Moderate Complexity: 1 Mod OT Treatments $Self Care/Home Management : 8-22 mins  Shritha Bresee H. OTR/L Supplemental OT, Department of rehab services [phone]  Cherrish Vitali R H. 03/22/2020, 2:02 PM

## 2020-03-22 NOTE — Progress Notes (Signed)
Palliative Medicine RN Note: Consult order noted to help [facility] get in touch with pt.  I spoke with Kathleen Schneider w [facility]. They are aware of her admission and are ready to proceed with scheduling hospice admission visit when she is discharged. She will likely need home O2 based on TRH note from yesterday.  Kathleen Schneider also has an existing MOST form in the [facility] tab requesting comfort care. As GOC are clear, and hospice has already been engaged, PMT does not have a further role and will sign off.   If new, palliative-specific needs arise, please call our office at [phone] and re-consult [name].  Marjie Skiff Lyndzee Kliebert, RN, BSN, [facility] Palliative Medicine Team 03/22/2020 1:23 PM Office [phone]

## 2020-03-22 NOTE — Progress Notes (Signed)
PROGRESS NOTE    Kathleen Schneider   XTG:[id number]  DOB: [birth year]  DOA: 03/20/2020     1  PCP: Donald Prose, MD  CC: SOB  Hospital Course: Kathleen Schneider is a 79 y.o. female with medical history significant for depression, CKD stage IIIb, chronic diastolic CHF, anemia, and recent diagnosis of metastatic breast cancer who presented to the ER with worsening shortness of breath and cough at home.  She had recently been treated approximately 1 month ago for pneumonia and required oxygen during that hospitalization but after completing antibiotics, she was stable on room air at home at discharge. She is significantly anxious and exhibits ongoing depression, mostly situational with her recent diagnosis. She also endorsed a sick contact at home, endorsing one of her aides was sick and coughing around her.   Upon arrival to the ED, patient is found to be afebrile, saturating mid to upper 80s on room air, mildly tachypneic, heart rate around 100, and with stable blood pressure.  EKG features sinus rhythm with LAFB and LAD.  Chest x-ray is stable with no acute findings.  Chemistry panel notable for creatinine 1.39, similar to priors.  CBC demonstrates an improved normocytic anemia with hemoglobin 10.6.  BNP is now normal.  COVID-19 PCR is negative.  Patient was treated with Atrovent, albuterol, Solu-Medrol, and azithromycin.  She continues to drop her saturations to the mid to upper 80s on room air.  D-dimer was also positive.  Procalcitonin negative. She was started on fluids and ordered to undergo CTA chest to rule out PE. The CTA chest was negative for PE. An RVP was obtained also and was positive for metapneumovirus. She was informed and recommended for supportive care.  PT/OT were also consulted due to her frequent falls at home and having difficulty with affording nurse aides at times.    Interval History:  No events overnight.  We discussed findings of her CTA chest as well as her respiratory  virus panel.  Also talked about continuing to try and wean her off of oxygen.  Lastly we discussed the recommendation for SNF however she was declined last hospitalization.  She is amenable for undergoing PT/OT evaluation and reconsidering at this time.  Old records reviewed in assessment of this patient  ROS: Constitutional: negative for chills and fevers, Respiratory: positive for cough, Cardiovascular: negative except for tachypnea and Gastrointestinal: negative for abdominal pain  Assessment & Plan: * Acute respiratory failure with hypoxia ([place]) -Presents with SOB, cough, and wheezing and is found to have new supplemental O2 requirement -She had a CT during admission in late October with bronchial wall thickening, tree-in-bud nodularity, and small airway impaction suspected secondary to infection, required supplemental O2 during the admission, completed a course of antibiotics, and went home on rm air but does not feel that she ever recovered -COVID and influenza negative in ED, no fever or leukocytosis present, no chest pain, leg swelling, or hemoptysis but resting HR has been ~100 and malignancy puts her at increased risk for PE - check CTA chest = negative for PE - see bronchitis  Acute bronchitis due to human metapneumovirus - likely explains her "cold like" symptoms and congestion - supportive care; no indication for abx or antivirals - d/c steroids - wean O2 as able; if unable needs O2 at discharge  Stage IV carcinoma of breast ([place]) -Recent diagnosis. Concerned about treatment candidacy from oncology perspective due to poor performance status and poor social support - needs PT eval, as patient  may not be safe for home; oncology also concerned - oncology also recommending palliative care/hospice; patient has a component of denial still at this time   Chronic diastolic CHF (congestive heart failure) ([place]) -Appears compensated -BNP now normal, was elevated last  admission -Monitor volume status  Normocytic anemia -Hgb is 10.6 on admission -Improved from last month, no bleeding reported  Depression -Continue Lexapro  Chronic kidney disease, stage 3b (HCC) -baseline approx 1.4-1.5, currently at baseline - IVF in anticipation of CTA    Antimicrobials:   DVT prophylaxis: HSQ Code Status: DNR Family Communication: none present Disposition Plan: Status is: Inpatient  Remains inpatient appropriate because:Unsafe d/c plan and Inpatient level of care appropriate due to severity of illness   Dispo: The patient is from: Home              Anticipated d/c is to: pending PT eval              Anticipated d/c date is: 1 day              Patient currently is not medically stable to d/c.  Objective: Blood pressure 105/68, pulse 63, temperature 97.7 F (36.5 C), temperature source Oral, resp. rate 17, height 5\' 4"  (1.626 m), weight 73.9 kg, SpO2 100 %.  Examination: General appearance: alert, cooperative, fatigued and no distress Head: Normocephalic, without obvious abnormality, atraumatic Eyes: EOMI Lungs: Coarse breath sounds bilaterally, no wheezing Heart: regular rate and rhythm and S1, S2 normal Abdomen: normal findings: bowel sounds normal Extremities: No edema Skin: mobility and turgor normal Neurologic: Grossly normal  Consultants:     Procedures:     Data Reviewed: I have personally reviewed following labs and imaging studies Results for orders placed or performed during the hospital encounter of 03/20/20 (from the past 24 hour(s))  Respiratory Panel by PCR     Status: Abnormal   Collection Time: 03/21/20  2:58 PM   Specimen: Nasopharyngeal Swab; Respiratory  Result Value Ref Range   Adenovirus NOT DETECTED NOT DETECTED   Coronavirus 229E NOT DETECTED NOT DETECTED   Coronavirus HKU1 NOT DETECTED NOT DETECTED   Coronavirus NL63 NOT DETECTED NOT DETECTED   Coronavirus OC43 NOT DETECTED NOT DETECTED    Metapneumovirus DETECTED (A) NOT DETECTED   Rhinovirus / Enterovirus NOT DETECTED NOT DETECTED   Influenza A NOT DETECTED NOT DETECTED   Influenza B NOT DETECTED NOT DETECTED   Parainfluenza Virus 1 NOT DETECTED NOT DETECTED   Parainfluenza Virus 2 NOT DETECTED NOT DETECTED   Parainfluenza Virus 3 NOT DETECTED NOT DETECTED   Parainfluenza Virus 4 NOT DETECTED NOT DETECTED   Respiratory Syncytial Virus NOT DETECTED NOT DETECTED   Bordetella pertussis NOT DETECTED NOT DETECTED   Chlamydophila pneumoniae NOT DETECTED NOT DETECTED   Mycoplasma pneumoniae NOT DETECTED NOT DETECTED  Basic metabolic panel     Status: Abnormal   Collection Time: 03/22/20  3:59 AM  Result Value Ref Range   Sodium 133 (L) 135 - 145 mmol/L   Potassium 4.6 3.5 - 5.1 mmol/L   Chloride 101 98 - 111 mmol/L   CO2 23 22 - 32 mmol/L   Glucose, Bld 118 (H) 70 - 99 mg/dL   BUN 21 8 - 23 mg/dL   Creatinine, Ser 1.24 (H) 0.44 - 1.00 mg/dL   Calcium 8.1 (L) 8.9 - 10.3 mg/dL   GFR, Estimated 44 (L) >60 mL/min   Anion gap 9 5 - 15  CBC with Differential/Platelet     Status:  Abnormal   Collection Time: 03/22/20  3:59 AM  Result Value Ref Range   WBC 8.0 4.0 - 10.5 K/uL   RBC 3.20 (L) 3.87 - 5.11 MIL/uL   Hemoglobin 9.1 (L) 12.0 - 15.0 g/dL   HCT 29.4 (L) 36 - 46 %   MCV 91.9 80.0 - 100.0 fL   MCH 28.4 26.0 - 34.0 pg   MCHC 31.0 30.0 - 36.0 g/dL   RDW 14.6 11.5 - 15.5 %   Platelets 275 150 - 400 K/uL   nRBC 0.0 0.0 - 0.2 %   Neutrophils Relative % 77 %   Neutro Abs 6.2 1.7 - 7.7 K/uL   Lymphocytes Relative 15 %   Lymphs Abs 1.2 0.7 - 4.0 K/uL   Monocytes Relative 7 %   Monocytes Absolute 0.5 0.1 - 1.0 K/uL   Eosinophils Relative 0 %   Eosinophils Absolute 0.0 0.0 - 0.5 K/uL   Basophils Relative 0 %   Basophils Absolute 0.0 0.0 - 0.1 K/uL   Immature Granulocytes 1 %   Abs Immature Granulocytes 0.06 0.00 - 0.07 K/uL  Magnesium     Status: None   Collection Time: 03/22/20  3:59 AM  Result Value Ref Range    Magnesium 2.3 1.7 - 2.4 mg/dL  Procalcitonin     Status: None   Collection Time: 03/22/20  3:59 AM  Result Value Ref Range   Procalcitonin <0.10 ng/mL    Recent Results (from the past 240 hour(s))  Resp Panel by RT-PCR (Flu A&B, Covid) Nasopharyngeal Swab     Status: None   Collection Time: 03/20/20  9:32 PM   Specimen: Nasopharyngeal Swab; Nasopharyngeal(NP) swabs in vial transport medium  Result Value Ref Range Status   SARS Coronavirus 2 by RT PCR NEGATIVE NEGATIVE Final    Comment: (NOTE) SARS-CoV-2 target nucleic acids are NOT DETECTED.  The SARS-CoV-2 RNA is generally detectable in upper respiratory specimens during the acute phase of infection. The lowest concentration of SARS-CoV-2 viral copies this assay can detect is 138 copies/mL. A negative result does not preclude SARS-Cov-2 infection and should not be used as the sole basis for treatment or other patient management decisions. A negative result may occur with  improper specimen collection/handling, submission of specimen other than nasopharyngeal swab, presence of viral mutation(s) within the areas targeted by this assay, and inadequate number of viral copies(<138 copies/mL). A negative result must be combined with clinical observations, patient history, and epidemiological information. The expected result is Negative.  Fact Sheet for Patients:  EntrepreneurPulse.com.au  Fact Sheet for Healthcare Providers:  IncredibleEmployment.be  This test is no t yet approved or cleared by the [place] FDA and  has been authorized for detection and/or diagnosis of SARS-CoV-2 by FDA under an Emergency Use Authorization (EUA). This EUA will remain  in effect (meaning this test can be used) for the duration of the COVID-19 declaration under Section 564(b)(1) of the Act, 21 U.S.C.section 360bbb-3(b)(1), unless the authorization is terminated  or revoked sooner.       Influenza A by PCR  NEGATIVE NEGATIVE Final   Influenza B by PCR NEGATIVE NEGATIVE Final    Comment: (NOTE) The Xpert Xpress SARS-CoV-2/FLU/RSV plus assay is intended as an aid in the diagnosis of influenza from Nasopharyngeal swab specimens and should not be used as a sole basis for treatment. Nasal washings and aspirates are unacceptable for Xpert Xpress SARS-CoV-2/FLU/RSV testing.  Fact Sheet for Patients: EntrepreneurPulse.com.au  Fact Sheet for Healthcare Providers: IncredibleEmployment.be  This  test is not yet approved or cleared by the [place] and has been authorized for detection and/or diagnosis of SARS-CoV-2 by FDA under an Emergency Use Authorization (EUA). This EUA will remain in effect (meaning this test can be used) for the duration of the COVID-19 declaration under Section 564(b)(1) of the Act, 21 U.S.C. section 360bbb-3(b)(1), unless the authorization is terminated or revoked.  Performed at [facility], [place] [address] [place], [place] [address]   Respiratory Panel by PCR     Status: Abnormal   Collection Time: 03/21/20  2:58 PM   Specimen: Nasopharyngeal Swab; Respiratory  Result Value Ref Range Status   Adenovirus NOT DETECTED NOT DETECTED Final   Coronavirus 229E NOT DETECTED NOT DETECTED Final    Comment: (NOTE) The Coronavirus on the Respiratory Panel, DOES NOT test for the novel  Coronavirus (2019 nCoV)    Coronavirus HKU1 NOT DETECTED NOT DETECTED Final   Coronavirus NL63 NOT DETECTED NOT DETECTED Final   Coronavirus OC43 NOT DETECTED NOT DETECTED Final   Metapneumovirus DETECTED (A) NOT DETECTED Final   Rhinovirus / Enterovirus NOT DETECTED NOT DETECTED Final   Influenza A NOT DETECTED NOT DETECTED Final   Influenza B NOT DETECTED NOT DETECTED Final   Parainfluenza Virus 1 NOT DETECTED NOT DETECTED Final   Parainfluenza Virus 2 NOT DETECTED NOT DETECTED Final   Parainfluenza Virus 3 NOT DETECTED NOT  DETECTED Final   Parainfluenza Virus 4 NOT DETECTED NOT DETECTED Final   Respiratory Syncytial Virus NOT DETECTED NOT DETECTED Final   Bordetella pertussis NOT DETECTED NOT DETECTED Final   Chlamydophila pneumoniae NOT DETECTED NOT DETECTED Final   Mycoplasma pneumoniae NOT DETECTED NOT DETECTED Final    Comment: Performed at [facility], [address], [place], [place] [address]     Radiology Studies: CT ANGIO CHEST PE W OR WO CONTRAST  Result Date: 03/21/2020 CLINICAL DATA:  Hypoxia. EXAM: CT ANGIOGRAPHY CHEST WITH CONTRAST TECHNIQUE: Multidetector CT imaging of the chest was performed using the standard protocol during bolus administration of intravenous contrast. Multiplanar CT image reconstructions and MIPs were obtained to evaluate the vascular anatomy. CONTRAST:  12mL OMNIPAQUE IOHEXOL 350 MG/ML SOLN COMPARISON:  February 10, 2020 FINDINGS: Cardiovascular: There is moderate severity calcification of the aortic arch. Satisfactory opacification of the pulmonary arteries to the segmental level. No evidence of pulmonary embolism. Normal heart size. No pericardial effusion. Mediastinum/Nodes: No enlarged mediastinal, hilar, or axillary lymph nodes. Thyroid gland, trachea, and esophagus demonstrate no significant findings. Lungs/Pleura: Stable subcentimeter noncalcified nodular areas are present, bilaterally. A trace amount of atelectasis is seen within the posterior aspect of the right lung base. There is no evidence of a pleural effusion or pneumothorax. Upper Abdomen: A stable 8 mm focus of parenchymal low attenuation is seen within the lateral aspect of the right lobe of the liver (axial CT image 119, CT series number 4). Musculoskeletal: Numerous stable, ill-defined sclerotic lesions of various sizes are seen scattered throughout the thoracic spine, ribs and sternum. Review of the MIP images confirms the above findings. IMPRESSION: 1. No evidence of pulmonary embolus. 2. Stable subcentimeter  bilateral noncalcified nodular areas. 3. Findings consistent with osseous metastasis. 4. Small, stable hepatic cyst versus hemangioma. Aortic Atherosclerosis (ICD10-I70.0). Electronically Signed   By: Virgina Norfolk M.D.   On: 03/21/2020 17:44   DG Chest Port 1 View  Result Date: 03/20/2020 CLINICAL DATA:  Cough and short of breath, hypoxia, history of breast cancer EXAM: PORTABLE CHEST 1  VIEW COMPARISON:  02/07/2020, 02/10/2020 FINDINGS: Single frontal view of the chest demonstrates an unremarkable cardiac silhouette. Mild diffuse interstitial prominence unchanged. No acute airspace disease, effusion, or pneumothorax. The sclerotic bony lesions seen on CT are less apparent by radiograph. No acute fracture. IMPRESSION: 1. Stable chest, no acute airspace disease. 2. Sclerotic bony metastases seen on previous CTs are less apparent by radiograph. Electronically Signed   By: Randa Ngo M.D.   On: 03/20/2020 22:01   CT ANGIO CHEST PE W OR WO CONTRAST  Final Result    DG Chest Port 1 View  Final Result      Scheduled Meds: . escitalopram  10 mg Oral Daily  . heparin  5,000 Units Subcutaneous Q8H  . lamoTRIgine  150 mg Oral BID  . lidocaine  2 patch Transdermal Q24H  . oxybutynin  5 mg Oral BID  . sodium chloride flush  3 mL Intravenous Q12H   PRN Meds: sodium chloride, acetaminophen **OR** acetaminophen, albuterol, ondansetron **OR** ondansetron (ZOFRAN) IV, polyethylene glycol, sodium chloride flush Continuous Infusions: . sodium chloride       LOS: 1 day  Time spent: Greater than 50% of the 35 minute visit was spent in counseling/coordination of care for the patient as laid out in the A&P.   Dwyane Dee, MD [facility] 03/22/2020, 2:32 PM

## 2020-03-22 NOTE — Progress Notes (Signed)
Manufacturing engineer [facility]) Hospital Liaison note.     This patient was referred to [facility] for home hospice services by [facility] prior to her admission to the hospital. We were not able to reach her to set up the initial hospice admission visit. We are happy to proceed with planning her admission once she discharges from the hospital.   Hospital Liaison will follow while hospitalized.   Thank you,     Farrel Gordon, RN, CCM       [place] (listed on [place] under Hospice[facility])     [phone]

## 2020-03-22 NOTE — Progress Notes (Signed)
Chaplain engaged in initial phone visit with [name].  During brief time on the phone, Kathleen Schneider was very tearful.  She explained that she wanted to go home but that her doctor wants her to go to a skilled nursing facility.  Chaplain offered support, but when trying to ask more questions surrounding this recommendation, Corlette voiced wanting the chaplain to call back later.  Chaplain let her know that she or another chaplain will follow-up.    03/22/20 1200  Clinical Encounter Type  Visited With Patient  Visit Type Initial;Spiritual support  Stress Factors  Patient Stress Factors Major life changes;Health changes

## 2020-03-22 NOTE — TOC Initial Note (Signed)
Transition of Care [facility]) - Initial/Assessment Note    Patient Details  Name: Kathleen Schneider MRN: [id number] Date of Birth: June 12, 1940  [facility]) CM/SW Contact:    Trish Mage, LCSW Phone Number: 03/22/2020, 12:52 PM  Clinical Narrative:   Patient who recently returned home from the hospital with [facility] services, private pay PCS through [place] and supportive friend after denial by insurance for short term rehab returns with SOB.  Received call from Joselyn Glassman with [facility] stating they had been trying to schedule assessment based on referral from PCP.  She asked if we would address here.  MD ordered palliative consult.  In meantime, I have left message for [name], friend, to  Update her, get updates. TOC will continue to follow during the course of hospitalization.                Expected Discharge Plan: [place] Barriers to Discharge: Continued Medical Work up   Patient Goals and CMS Choice        Expected Discharge Plan and Services Expected Discharge Plan: [place]       Living arrangements for the past 2 months: Single Family Home                                      Prior Living Arrangements/Services Living arrangements for the past 2 months: Single Family Home Lives with:: Self Patient language and need for interpreter reviewed:: Yes        Need for Family Participation in Patient Care: No (Comment) (No family) Care giver support system in place?: Yes (comment)   Criminal Activity/Legal Involvement Pertinent to Current Situation/Hospitalization: No - Comment as needed  Activities of Daily Living Home Assistive Devices/Equipment: Cane (specify quad or straight), Eyeglasses, Other (Comment), Walker (specify type), Wheelchair (walk-in shower, single point cane, front wheeled walker, manual wheelchair) ADL Screening (condition at time of admission) Patient's cognitive ability adequate to safely  complete daily activities?: Yes Is the patient deaf or have difficulty hearing?: No Does the patient have difficulty seeing, even when wearing glasses/contacts?: No Does the patient have difficulty concentrating, remembering, or making decisions?: Yes Patient able to express need for assistance with ADLs?: Yes Does the patient have difficulty dressing or bathing?: Yes Independently performs ADLs?: No Communication: Independent Dressing (OT): Needs assistance Is this a change from baseline?: Change from baseline, expected to last >3 days Grooming: Needs assistance Is this a change from baseline?: Change from baseline, expected to last >3 days Feeding: Needs assistance Is this a change from baseline?: Change from baseline, expected to last >3 days Bathing: Needs assistance Is this a change from baseline?: Change from baseline, expected to last >3 days Toileting: Needs assistance Is this a change from baseline?: Change from baseline, expected to last >3days In/Out Bed: Needs assistance Is this a change from baseline?: Change from baseline, expected to last >3 days Walks in Home: Needs assistance Is this a change from baseline?: Change from baseline, expected to last >3 days Does the patient have difficulty walking or climbing stairs?: Yes (secondary to shortness of breath and weakness) Weakness of Legs: Both Weakness of Arms/Hands: None  Permission Sought/Granted Permission sought to share information with : Family Supports Permission granted to share information with : Yes, Verbal Permission Granted  Share Information with NAME: Faythe Ghee (Friend) [phone]  Emotional Assessment Appearance:: Appears stated age Attitude/Demeanor/Rapport: Engaged Affect (typically observed): Anxious, Tearful/Crying Orientation: : Oriented to Self, Oriented to Place, Oriented to Situation Alcohol / Substance Use: Not Applicable Psych Involvement: No (comment)  Admission  diagnosis:  Wheezing [R06.2] Cough [R05.9] Acute upper respiratory infection [J06.9] Hypoxia [R09.02] Acute respiratory failure with hypoxia (HCC) [J96.01] Patient Active Problem List   Diagnosis Date Noted  . Acute respiratory failure with hypoxia ([place]) 03/21/2020  . Chronic kidney disease, stage 3b ([place]) 03/21/2020  . Depression 03/21/2020  . Normocytic anemia 03/21/2020  . Chronic diastolic CHF (congestive heart failure) ([place]) 03/21/2020  . Stage IV carcinoma of breast ([place])   . Advanced care planning/counseling discussion   . Goals of care, counseling/discussion   . Palliative care by specialist   . Fall at home, initial encounter 12/09/2019  . Acute lower UTI 12/09/2019  . Rhabdomyolysis 12/09/2019  . Dehydration 12/09/2019  . Advanced age 44/25/2021  . Bilateral knee pain 12/09/2019  . Facet joint syndrome 05/13/2017  . Spinal stenosis, lumbar 09/25/2016  . Lower back pain 03/27/2016  . Excessive daytime sleepiness 05/24/2015  . Postherpetic neuralgia 02/18/2015  . Dysesthesia 02/18/2015  . Insomnia 02/18/2015  . Snoring 02/18/2015   PCP:  Donald Prose, MD Pharmacy:   [id number] [place], [place] [address] Phone: [phone] Fax: [phone]  [id number] [place], [place] [address] [place] [address] Phone: [phone] Fax: [phone]     Social Determinants of Health (SDOH) Interventions    Readmission Risk Interventions Readmission Risk Prevention Plan 12/10/2019  Medication Screening Complete  Transportation Screening Complete  Some recent data might be hidden

## 2020-03-23 LAB — CBC WITH DIFFERENTIAL/PLATELET
Abs Immature Granulocytes: 0.03 10*3/uL (ref 0.00–0.07)
Basophils Absolute: 0 10*3/uL (ref 0.0–0.1)
Basophils Relative: 0 %
Eosinophils Absolute: 0.1 10*3/uL (ref 0.0–0.5)
Eosinophils Relative: 1 %
HCT: 33.2 % — ABNORMAL LOW (ref 36.0–46.0)
Hemoglobin: 10 g/dL — ABNORMAL LOW (ref 12.0–15.0)
Immature Granulocytes: 1 %
Lymphocytes Relative: 24 %
Lymphs Abs: 1.4 10*3/uL (ref 0.7–4.0)
MCH: 28.2 pg (ref 26.0–34.0)
MCHC: 30.1 g/dL (ref 30.0–36.0)
MCV: 93.5 fL (ref 80.0–100.0)
Monocytes Absolute: 0.5 10*3/uL (ref 0.1–1.0)
Monocytes Relative: 8 %
Neutro Abs: 3.8 10*3/uL (ref 1.7–7.7)
Neutrophils Relative %: 66 %
Platelets: 285 10*3/uL (ref 150–400)
RBC: 3.55 MIL/uL — ABNORMAL LOW (ref 3.87–5.11)
RDW: 15 % (ref 11.5–15.5)
WBC: 5.7 10*3/uL (ref 4.0–10.5)
nRBC: 0 % (ref 0.0–0.2)

## 2020-03-23 LAB — BASIC METABOLIC PANEL
Anion gap: 9 (ref 5–15)
BUN: 31 mg/dL — ABNORMAL HIGH (ref 8–23)
CO2: 26 mmol/L (ref 22–32)
Calcium: 8.9 mg/dL (ref 8.9–10.3)
Chloride: 101 mmol/L (ref 98–111)
Creatinine, Ser: 1.62 mg/dL — ABNORMAL HIGH (ref 0.44–1.00)
GFR, Estimated: 32 mL/min — ABNORMAL LOW (ref >60)
Glucose, Bld: 86 mg/dL (ref 70–99)
Potassium: 4.7 mmol/L (ref 3.5–5.1)
Sodium: 136 mmol/L (ref 135–145)

## 2020-03-23 LAB — PROCALCITONIN: Procalcitonin: <0.1 ng/mL

## 2020-03-23 LAB — MAGNESIUM: Magnesium: 2.4 mg/dL (ref 1.7–2.4)

## 2020-03-23 MED ORDER — PSEUDOEPHEDRINE HCL 60 MG PO TABS
60.0000 mg | ORAL_TABLET | Freq: Two times a day (BID) | ORAL | Status: DC
  Administered 2020-03-23 – 2020-03-26 (×7): 60 mg via ORAL
  Filled 2020-03-23 (×7): qty 1

## 2020-03-23 MED ORDER — SODIUM CHLORIDE 0.9 % IV SOLN: INTRAVENOUS | Status: AC

## 2020-03-23 MED ORDER — DM-GUAIFENESIN ER 30-600 MG PO TB12
1.0000 | ORAL_TABLET | Freq: Two times a day (BID) | ORAL | Status: DC
  Administered 2020-03-23 – 2020-03-26 (×7): 1 via ORAL
  Filled 2020-03-23 (×7): qty 1

## 2020-03-23 NOTE — NC FL2 (Signed)
[place] LEVEL OF CARE SCREENING TOOL     IDENTIFICATION  Patient Name: Kathleen Schneider Birthdate: [birth year] Sex: female Admission Date (Current Location): 03/20/2020  [facility] and [place] Number:  Herbalist and Address:  [facility],  [place] [address] [place]      Provider Number: [id number]  Attending Physician Name and Address:  Dwyane Dee, MD  Relative Name and Phone Number:  Faythe Ghee Denman [phone]    Current Level of Care: Hospital Recommended Level of Care: [place] Prior Approval Number:    Date Approved/Denied:   PASRR Number: [id number]  Discharge Plan: SNF    Current Diagnoses: Patient Active Problem List   Diagnosis Date Noted  . Acute bronchitis due to human metapneumovirus 03/22/2020  . Acute respiratory failure with hypoxia ([place]) 03/21/2020  . Chronic kidney disease, stage 3b ([place]) 03/21/2020  . Depression 03/21/2020  . Normocytic anemia 03/21/2020  . Chronic diastolic CHF (congestive heart failure) ([place]) 03/21/2020  . Stage IV carcinoma of breast ([place])   . Advanced care planning/counseling discussion   . Goals of care, counseling/discussion   . Palliative care by specialist   . Fall at home, initial encounter 12/09/2019  . Acute lower UTI 12/09/2019  . Rhabdomyolysis 12/09/2019  . Dehydration 12/09/2019  . Advanced age 80/25/2021  . Bilateral knee pain 12/09/2019  . Facet joint syndrome 05/13/2017  . Spinal stenosis, lumbar 09/25/2016  . Lower back pain 03/27/2016  . Excessive daytime sleepiness 05/24/2015  . Postherpetic neuralgia 02/18/2015  . Dysesthesia 02/18/2015  . Insomnia 02/18/2015  . Snoring 02/18/2015    Orientation RESPIRATION BLADDER Height & Weight     Self, Time, Situation, Place  O2 (2L [place]) Continent Weight: 73.9 kg Height:  5\' 4"  (162.6 cm)  BEHAVIORAL SYMPTOMS/MOOD NEUROLOGICAL BOWEL NUTRITION STATUS   (none)  (none) Continent Diet (see  d/c summary)  AMBULATORY STATUS COMMUNICATION OF NEEDS Skin   Extensive Assist Verbally Normal                       Personal Care Assistance Level of Assistance  Bathing, Feeding, Dressing Bathing Assistance: Maximum assistance Feeding assistance: Independent Dressing Assistance: Limited assistance     Functional Limitations Info  Sight, Hearing, Speech Sight Info: Adequate Hearing Info: Adequate Speech Info: Adequate    SPECIAL CARE FACTORS FREQUENCY  PT (By licensed PT), OT (By licensed OT)     PT Frequency: 5X/W OT Frequency: 5X/W            Contractures Contractures Info: Not present    Additional Factors Info  Code Status, Allergies Code Status Info: DNR Allergies Info: NKA           Current Medications (03/23/2020):  This is the current hospital active medication list Current Facility-Administered Medications  Medication Dose Route Frequency Provider Last Rate Last Admin  . 0.9 %  sodium chloride infusion  250 mL Intravenous PRN Opyd, Ilene Qua, MD      . 0.9 %  sodium chloride infusion   Intravenous Continuous Dwyane Dee, MD 75 mL/hr at 03/23/20 1138 New Bag at 03/23/20 1138  . acetaminophen (TYLENOL) tablet 650 mg  650 mg Oral Q6H PRN Opyd, Ilene Qua, MD       Or  . acetaminophen (TYLENOL) suppository 650 mg  650 mg Rectal Q6H PRN Opyd, Ilene Qua, MD      . albuterol (VENTOLIN HFA) 108 (90 Base) MCG/ACT inhaler 2 puff  2  puff Inhalation Q4H PRN Opyd, Ilene Qua, MD      . dextromethorphan-guaiFENesin (MUCINEX DM) 30-600 MG per 12 hr tablet 1 tablet  1 tablet Oral BID Dwyane Dee, MD      . escitalopram (LEXAPRO) tablet 10 mg  10 mg Oral Daily Opyd, Ilene Qua, MD   10 mg at 03/23/20 1021  . heparin injection 5,000 Units  5,000 Units Subcutaneous Q8H Opyd, Ilene Qua, MD   5,000 Units at 03/23/20 0540  . lamoTRIgine (LAMICTAL) tablet 150 mg  150 mg Oral BID Opyd, Ilene Qua, MD   150 mg at 03/23/20 1021  . lidocaine (LIDODERM) 5 % 2 patch  2 patch  Transdermal Q24H Lang Snow, FNP   2 patch at 03/22/20 2200  . ondansetron (ZOFRAN) tablet 4 mg  4 mg Oral Q6H PRN Opyd, Ilene Qua, MD       Or  . ondansetron (ZOFRAN) injection 4 mg  4 mg Intravenous Q6H PRN Opyd, Ilene Qua, MD      . oxybutynin (DITROPAN) tablet 5 mg  5 mg Oral BID Opyd, Ilene Qua, MD   5 mg at 03/23/20 1021  . polyethylene glycol (MIRALAX / GLYCOLAX) packet 17 g  17 g Oral Daily PRN Opyd, Ilene Qua, MD      . pseudoephedrine (SUDAFED) tablet 60 mg  60 mg Oral BID Dwyane Dee, MD      . sodium chloride flush (NS) 0.9 % injection 3 mL  3 mL Intravenous Q12H Opyd, Ilene Qua, MD   3 mL at 03/22/20 2159  . sodium chloride flush (NS) 0.9 % injection 3 mL  3 mL Intravenous PRN Opyd, Ilene Qua, MD         Discharge Medications: Please see discharge summary for a list of discharge medications.  Relevant Imaging Results:  Relevant Lab Results:   Additional Information [address], [place]

## 2020-03-23 NOTE — Progress Notes (Signed)
Manufacturing engineer [facility]) Hospital Liaison: RN note     This patient was referred for hospice services at home by [facility] prior to admission to the hospital.  Chart and patient information  Reviewed by [facility] physician. Hospice eligibility confirmed.     Liaison will continue to follow while hospitalized.  DME needs to be determined closer to time of discharge.      Please call with any hospice related questions.     Thank you for this referral.     Farrel Gordon, RN, [facility] (listed on [place] under [place])   [phone]

## 2020-03-23 NOTE — TOC Progression Note (Signed)
Transition of Care [facility]) - Progression Note    Patient Details  Name: Kathleen Schneider MRN: [id number] Date of Birth: [birth year]  Transition of Care [facility]) CM/SW [place], [place] Phone Number: 03/23/2020, 11:01 AM  Clinical Narrative:   Patient seen in follow up to PT recommendation of SNF/24 hour supervision.  [name] was last seen here a month ago; has been living at home with the support of PCS services through [place], [facility] services through [facility] and the support of a friend and her family, [name]. She recently cut back in her hours with PCS from 8 to 5.   Apparently, she has basically been bedbound when no one else is in the home with her as she is afraid of falling.  However, according to [place] with [facility], [name] has been actively participating with PT/OT when they are in the home, and has been making gains with both transfers and gait as she moves from the bed to chair or bed to [facility] with assistance/supervision. [name] is hopeful about going to short term rehab as she anticipates more gains towards independence with more intensive help. Bed search initiated. Insurance authorization initiated. TOC will continue to follow during the course of hospitalization.     Expected Discharge Plan: [place] Barriers to Discharge: Continued Medical Work up  Expected Discharge Plan and Services Expected Discharge Plan: [place]   Discharge Planning Services: CM Consult Post Acute Care Choice: [place] Living arrangements for the past 2 months: [place] Agency: [facility]         Social Determinants of Health (SDOH) Interventions    Readmission Risk Interventions Readmission Risk Prevention Plan 12/10/2019  Medication Screening Complete  Transportation Screening Complete  Some recent data might be hidden

## 2020-03-23 NOTE — Progress Notes (Signed)
PROGRESS NOTE    Kathleen Schneider   XBL:[id number]  DOB: November 12, 1940  DOA: 03/20/2020     2  PCP: Donald Prose, MD  CC: SOB  Hospital Course: Kathleen Schneider is a 79 y.o. female with medical history significant for depression, CKD stage IIIb, chronic diastolic CHF, anemia, and recent diagnosis of metastatic breast cancer who presented to the ER with worsening shortness of breath and cough at home.  She had recently been treated approximately 1 month ago for pneumonia and required oxygen during that hospitalization but after completing antibiotics, she was stable on room air at home at discharge. She is significantly anxious and exhibits ongoing depression, mostly situational with her recent diagnosis. She also endorsed a sick contact at home, endorsing one of her aides was sick and coughing around her.   Upon arrival to the ED, patient is found to be afebrile, saturating mid to upper 80s on room air, mildly tachypneic, heart rate around 100, and with stable blood pressure.  EKG features sinus rhythm with LAFB and LAD.  Chest x-ray is stable with no acute findings.  Chemistry panel notable for creatinine 1.39, similar to priors.  CBC demonstrates an improved normocytic anemia with hemoglobin 10.6.  BNP is now normal.  COVID-19 PCR is negative.  Patient was treated with Atrovent, albuterol, Solu-Medrol, and azithromycin.  She continues to drop her saturations to the mid to upper 80s on room air.  D-dimer was also positive.  Procalcitonin negative. She was started on fluids and ordered to undergo CTA chest to rule out PE. The CTA chest was negative for PE. An RVP was obtained also and was positive for metapneumovirus. She was informed and recommended for supportive care.  PT/OT were also consulted due to her frequent falls at home and having difficulty with affording nurse aides at times.  SNF was recommended and patient was amenable with this if able to be approved by insurance.    Interval  History:  No events overnight. Feeling a little better each day. Still having some congestion this am. We discussed SNF rec's and she is open to going if approved. We then talked about her cancer some and her overall long term goals. Ideally she'd like to remain at home but listened to me when I stated there's a good chance she may decline again in the future.   Old records reviewed in assessment of this patient  ROS: Constitutional: negative for chills and fevers, Respiratory: positive for cough, Cardiovascular: negative except for tachypnea and Gastrointestinal: negative for abdominal pain  Assessment & Plan: * Acute respiratory failure with hypoxia ([place]) -Presents with SOB, cough, and wheezing and is found to have new supplemental O2 requirement -She had a CT during admission in late October with bronchial wall thickening, tree-in-bud nodularity, and small airway impaction suspected secondary to infection, required supplemental O2 during the admission, completed a course of antibiotics, and went home on room air but does not feel that she ever recovered -COVID and influenza negative in ED - check CTA chest = negative for PE - see bronchitis  Acute bronchitis due to human metapneumovirus - likely explains her "cold like" symptoms and congestion - supportive care; no indication for abx or antivirals - d/c steroids - wean O2 as able; if unable needs O2 at discharge  Stage IV carcinoma of breast ([place]) -Stage IV metastatic breast cancer with diffuse sclerotic bone lesion -Recent diagnosis. Concerned about treatment candidacy from oncology perspective due to poor performance status and poor  social support - needs PT eval, as patient may not be safe for home; oncology also concerned - oncology also recommending palliative care/hospice; patient has a component of denial but more accepting of conversations today during discussion  - PT recommending SNF  Chronic diastolic CHF (congestive  heart failure) ([place]) -Appears compensated -BNP now normal, was elevated last admission -Monitor volume status  Normocytic anemia -Hgb is 10.6 on admission -Improved from last month, no bleeding reported  Depression -Continue Lexapro  Chronic kidney disease, stage 3b ([place]) -baseline approx 1.4-1.5, currently at baseline - IVF in anticipation of CTA   Antimicrobials:   DVT prophylaxis: HSQ Code Status: DNR Family Communication: none present Disposition Plan: Status is: Inpatient  Remains inpatient appropriate because:Unsafe d/c plan and Inpatient level of care appropriate due to severity of illness   Dispo: The patient is from: Home              Anticipated d/c is to: SNF              Anticipated d/c date is: 2 days              Patient currently is not medically stable to d/c.  Objective: Blood pressure 129/63, pulse (!) 58, temperature 97.7 F (36.5 C), resp. rate 20, height 5\' 4"  (1.626 m), weight 73.9 kg, SpO2 100 %.  Examination: General appearance: alert, cooperative, fatigued and no distress Head: Normocephalic, without obvious abnormality, atraumatic Eyes: EOMI Lungs: Coarse breath sounds bilaterally, no wheezing Heart: regular rate and rhythm and S1, S2 normal Abdomen: normal findings: bowel sounds normal Extremities: No edema Skin: mobility and turgor normal Neurologic: Grossly normal  Consultants:     Procedures:     Data Reviewed: I have personally reviewed following labs and imaging studies Results for orders placed or performed during the hospital encounter of 03/20/20 (from the past 24 hour(s))  Basic metabolic panel     Status: Abnormal   Collection Time: 03/23/20  4:11 AM  Result Value Ref Range   Sodium 136 135 - 145 mmol/L   Potassium 4.7 3.5 - 5.1 mmol/L   Chloride 101 98 - 111 mmol/L   CO2 26 22 - 32 mmol/L   Glucose, Bld 86 70 - 99 mg/dL   BUN 31 (H) 8 - 23 mg/dL   Creatinine, Ser 1.62 (H) 0.44 - 1.00 mg/dL    Calcium 8.9 8.9 - 10.3 mg/dL   GFR, Estimated 32 (L) >60 mL/min   Anion gap 9 5 - 15  CBC with Differential/Platelet     Status: Abnormal   Collection Time: 03/23/20  4:11 AM  Result Value Ref Range   WBC 5.7 4.0 - 10.5 K/uL   RBC 3.55 (L) 3.87 - 5.11 MIL/uL   Hemoglobin 10.0 (L) 12.0 - 15.0 g/dL   HCT 33.2 (L) 36 - 46 %   MCV 93.5 80.0 - 100.0 fL   MCH 28.2 26.0 - 34.0 pg   MCHC 30.1 30.0 - 36.0 g/dL   RDW 15.0 11.5 - 15.5 %   Platelets 285 150 - 400 K/uL   nRBC 0.0 0.0 - 0.2 %   Neutrophils Relative % 66 %   Neutro Abs 3.8 1.7 - 7.7 K/uL   Lymphocytes Relative 24 %   Lymphs Abs 1.4 0.7 - 4.0 K/uL   Monocytes Relative 8 %   Monocytes Absolute 0.5 0.1 - 1.0 K/uL   Eosinophils Relative 1 %   Eosinophils Absolute 0.1 0.0 - 0.5 K/uL   Basophils  Relative 0 %   Basophils Absolute 0.0 0.0 - 0.1 K/uL   Immature Granulocytes 1 %   Abs Immature Granulocytes 0.03 0.00 - 0.07 K/uL   Reactive, Benign Lymphocytes PRESENT   Magnesium     Status: None   Collection Time: 03/23/20  4:11 AM  Result Value Ref Range   Magnesium 2.4 1.7 - 2.4 mg/dL  Procalcitonin     Status: None   Collection Time: 03/23/20  4:11 AM  Result Value Ref Range   Procalcitonin <0.10 ng/mL    Recent Results (from the past 240 hour(s))  Resp Panel by RT-PCR (Flu A&B, Covid) Nasopharyngeal Swab     Status: None   Collection Time: 03/20/20  9:32 PM   Specimen: Nasopharyngeal Swab; Nasopharyngeal(NP) swabs in vial transport medium  Result Value Ref Range Status   SARS Coronavirus 2 by RT PCR NEGATIVE NEGATIVE Final    Comment: (NOTE) SARS-CoV-2 target nucleic acids are NOT DETECTED.  The SARS-CoV-2 RNA is generally detectable in upper respiratory specimens during the acute phase of infection. The lowest concentration of SARS-CoV-2 viral copies this assay can detect is 138 copies/mL. A negative result does not preclude SARS-Cov-2 infection and should not be used as the sole basis for treatment or other patient  management decisions. A negative result may occur with  improper specimen collection/handling, submission of specimen other than nasopharyngeal swab, presence of viral mutation(s) within the areas targeted by this assay, and inadequate number of viral copies(<138 copies/mL). A negative result must be combined with clinical observations, patient history, and epidemiological information. The expected result is Negative.  Fact Sheet for Patients:  EntrepreneurPulse.com.au  Fact Sheet for Healthcare Providers:  IncredibleEmployment.be  This test is no t yet approved or cleared by the [place] FDA and  has been authorized for detection and/or diagnosis of SARS-CoV-2 by FDA under an Emergency Use Authorization (EUA). This EUA will remain  in effect (meaning this test can be used) for the duration of the COVID-19 declaration under Section 564(b)(1) of the Act, 21 U.S.C.section 360bbb-3(b)(1), unless the authorization is terminated  or revoked sooner.       Influenza A by PCR NEGATIVE NEGATIVE Final   Influenza B by PCR NEGATIVE NEGATIVE Final    Comment: (NOTE) The Xpert Xpress SARS-CoV-2/FLU/RSV plus assay is intended as an aid in the diagnosis of influenza from Nasopharyngeal swab specimens and should not be used as a sole basis for treatment. Nasal washings and aspirates are unacceptable for Xpert Xpress SARS-CoV-2/FLU/RSV testing.  Fact Sheet for Patients: EntrepreneurPulse.com.au  Fact Sheet for Healthcare Providers: IncredibleEmployment.be  This test is not yet approved or cleared by the [place] FDA and has been authorized for detection and/or diagnosis of SARS-CoV-2 by FDA under an Emergency Use Authorization (EUA). This EUA will remain in effect (meaning this test can be used) for the duration of the COVID-19 declaration under Section 564(b)(1) of the Act, 21 U.S.C. section 360bbb-3(b)(1),  unless the authorization is terminated or revoked.  Performed at [facility], [place] [address] [place], [place] [address]   Respiratory Panel by PCR     Status: Abnormal   Collection Time: 03/21/20  2:58 PM   Specimen: Nasopharyngeal Swab; Respiratory  Result Value Ref Range Status   Adenovirus NOT DETECTED NOT DETECTED Final   Coronavirus 229E NOT DETECTED NOT DETECTED Final    Comment: (NOTE) The Coronavirus on the Respiratory Panel, DOES NOT test for the novel  Coronavirus (2019 nCoV)    Coronavirus  HKU1 NOT DETECTED NOT DETECTED Final   Coronavirus NL63 NOT DETECTED NOT DETECTED Final   Coronavirus OC43 NOT DETECTED NOT DETECTED Final   Metapneumovirus DETECTED (A) NOT DETECTED Final   Rhinovirus / Enterovirus NOT DETECTED NOT DETECTED Final   Influenza A NOT DETECTED NOT DETECTED Final   Influenza B NOT DETECTED NOT DETECTED Final   Parainfluenza Virus 1 NOT DETECTED NOT DETECTED Final   Parainfluenza Virus 2 NOT DETECTED NOT DETECTED Final   Parainfluenza Virus 3 NOT DETECTED NOT DETECTED Final   Parainfluenza Virus 4 NOT DETECTED NOT DETECTED Final   Respiratory Syncytial Virus NOT DETECTED NOT DETECTED Final   Bordetella pertussis NOT DETECTED NOT DETECTED Final   Chlamydophila pneumoniae NOT DETECTED NOT DETECTED Final   Mycoplasma pneumoniae NOT DETECTED NOT DETECTED Final    Comment: Performed at [facility], [place] [address], [place], [place] [address]     Radiology Studies: CT ANGIO CHEST PE W OR WO CONTRAST  Result Date: 03/21/2020 CLINICAL DATA:  Hypoxia. EXAM: CT ANGIOGRAPHY CHEST WITH CONTRAST TECHNIQUE: Multidetector CT imaging of the chest was performed using the standard protocol during bolus administration of intravenous contrast. Multiplanar CT image reconstructions and MIPs were obtained to evaluate the vascular anatomy. CONTRAST:  46mL OMNIPAQUE IOHEXOL 350 MG/ML SOLN COMPARISON:  February 10, 2020 FINDINGS: Cardiovascular: There is  moderate severity calcification of the aortic arch. Satisfactory opacification of the pulmonary arteries to the segmental level. No evidence of pulmonary embolism. Normal heart size. No pericardial effusion. Mediastinum/Nodes: No enlarged mediastinal, hilar, or axillary lymph nodes. Thyroid gland, trachea, and esophagus demonstrate no significant findings. Lungs/Pleura: Stable subcentimeter noncalcified nodular areas are present, bilaterally. A trace amount of atelectasis is seen within the posterior aspect of the right lung base. There is no evidence of a pleural effusion or pneumothorax. Upper Abdomen: A stable 8 mm focus of parenchymal low attenuation is seen within the lateral aspect of the right lobe of the liver (axial CT image 119, CT series number 4). Musculoskeletal: Numerous stable, ill-defined sclerotic lesions of various sizes are seen scattered throughout the thoracic spine, ribs and sternum. Review of the MIP images confirms the above findings. IMPRESSION: 1. No evidence of pulmonary embolus. 2. Stable subcentimeter bilateral noncalcified nodular areas. 3. Findings consistent with osseous metastasis. 4. Small, stable hepatic cyst versus hemangioma. Aortic Atherosclerosis (ICD10-I70.0). Electronically Signed   By: Virgina Norfolk M.D.   On: 03/21/2020 17:44   CT ANGIO CHEST PE W OR WO CONTRAST  Final Result    DG Chest Port 1 View  Final Result      Scheduled Meds: . dextromethorphan-guaiFENesin  1 tablet Oral BID  . escitalopram  10 mg Oral Daily  . heparin  5,000 Units Subcutaneous Q8H  . lamoTRIgine  150 mg Oral BID  . lidocaine  2 patch Transdermal Q24H  . oxybutynin  5 mg Oral BID  . pseudoephedrine  60 mg Oral BID  . sodium chloride flush  3 mL Intravenous Q12H   PRN Meds: sodium chloride, acetaminophen **OR** acetaminophen, albuterol, ondansetron **OR** ondansetron (ZOFRAN) IV, polyethylene glycol, sodium chloride flush Continuous Infusions: . sodium chloride    . sodium  chloride 75 mL/hr at 03/23/20 1138     LOS: 2 days  Time spent: Greater than 50% of the 35 minute visit was spent in counseling/coordination of care for the patient as laid out in the A&P.   Dwyane Dee, MD [facility] 03/23/2020, 3:06 PM

## 2020-03-24 NOTE — Progress Notes (Signed)
Manufacturing engineer (ACC) [facility] [place]  Notified by [facility] manager that patient would prefer Palliative services at home after discharge.               [place] Palliative team will follow up with patient after discharge.         Please call with any hospice or palliative related questions.         Thank you for the opportunity to participate in this patient's care.     Gar Ponto, RN [facility] [facility] Liaison (on [place])    [phone]

## 2020-03-24 NOTE — Progress Notes (Signed)
Occupational Therapy Treatment Patient Details Name: Kathleen Schneider MRN: [id number] DOB: 1940/06/29 Today's Date: 03/24/2020    History of present illness Pt is a 79 y.o. female with medical history significant for depression, CKD stage IIIb, chronic diastolic CHF, anemia, and recent diagnosis of metastatic breast cancer who presented to the ER with worsening shortness of breath and cough at home.  Pt admitted with acute resp failure with hypoxia due to acute bronchitis and metapneumovirus.   OT comments  Patient motivated to participate in therapy. Requires min A for functional transfers due to decreased activity tolerance, balance and mod cues for safe hand placement especially from stand to sit. Patient unable to tolerate standing sink side for g/h after toileting on [facility] therefore transferred to recliner to complete. Once seated in recliner patient O2 read 93% on room air. Continue to recommend acute OT services to maximize patient activity tolerance, global strengthening, safety awareness in order to facilitate D/C to venue listed below.   Follow Up Recommendations  SNF    Equipment Recommendations  None recommended by OT       Precautions / Restrictions Precautions Precautions: Fall Precaution Comments: monitor sats Restrictions Weight Bearing Restrictions: No       Mobility Bed Mobility Overal bed mobility: Needs Assistance Bed Mobility: Supine to Sit     Supine to sit: Min assist;HOB elevated     General bed mobility comments: lateral loss of balance when trying to elevate trunk, min A to complete  Transfers Overall transfer level: Needs assistance Equipment used: Rolling walker (2 wheeled) Transfers: Sit to/from Stand Sit to Stand: Min assist         General transfer comment: please see toilet transfer in ADL section    Balance Overall balance assessment: Needs assistance Sitting-balance support: Feet supported Sitting balance-[name]: Good      Standing balance support: Bilateral upper extremity supported;During functional activity;Single extremity supported Standing balance-[name]: Poor Standing balance comment: Reliance on RW                           ADL either performed or assessed with clinical judgement   ADL Overall ADL's : Needs assistance/impaired     Grooming: Oral care;Wash/dry face;Wash/dry hands;Brushing hair;Set up;Sitting Grooming Details (indicate cue type and reason): decreased standing tolerance, needed to transition to sitting after use of BSC in order to perform g/h                 Toilet Transfer: Minimal assistance;Ambulation;RW;BSC;Cueing for safety Toilet Transfer Details (indicate cue type and reason): min A for stability as patient is unsteady, mod cues for hand placement to maximize patient safety Toileting- Clothing Manipulation and Hygiene: Total assistance;Sit to/from stand Toileting - Clothing Manipulation Details (indicate cue type and reason): decreased standing tolerance, standing balance reliant on B UE support of walker     Functional mobility during ADLs: Minimal assistance;Rolling walker;Cueing for safety General ADL Comments: patient fatigues quickly with standing activity, checked O2 once seated in chair patient 93% on room air               Cognition Arousal/Alertness: Awake/alert Behavior During Therapy: WFL for tasks assessed/performed Overall Cognitive Status: Within Functional Limits for tasks assessed  Pertinent Vitals/ Pain       Pain Assessment: Faces Faces Pain Scale: No hurt         Frequency  Min 2X/week        Progress Toward Goals  OT Goals(current goals can now be found in the care plan section)  Progress towards OT goals: Progressing toward goals  Acute Rehab OT Goals Patient Stated Goal: To return home.  OT Goal Formulation: With patient Time For Goal  Achievement: 04/05/20 Potential to Achieve Goals: Good ADL Goals Pt Will Perform Lower Body Dressing: with supervision;sit to/from stand Pt Will Transfer to Toilet: with supervision;ambulating Pt Will Perform Toileting - Clothing Manipulation and hygiene: with supervision;sit to/from stand Additional ADL Goal #1: Patient will recall and demo 3 energy conservation techniques in prep for ADLs.  Plan Discharge plan remains appropriate       AM-PAC OT "6 Clicks" Daily Activity     Outcome Measure   Help from another person eating meals?: A Little Help from another person taking care of personal grooming?: A Little Help from another person toileting, which includes using toliet, bedpan, or urinal?: A Lot Help from another person bathing (including washing, rinsing, drying)?: A Lot Help from another person to put on and taking off regular upper body clothing?: None Help from another person to put on and taking off regular lower body clothing?: A Little 6 Click Score: 17    End of Session Equipment Utilized During Treatment: Rolling walker  OT Visit Diagnosis: Unsteadiness on feet (R26.81);Muscle weakness (generalized) (M62.81);Repeated falls (R29.6)   Activity Tolerance Patient tolerated treatment well   Patient Left in chair;with call bell/phone within reach;with chair alarm set   Nurse Communication Mobility status        Time: 9191-6606 OT Time Calculation (min): 34 min  Charges: OT General Charges $OT Visit: 1 Visit OT Treatments $Self Care/Home Management : 23-37 mins  Delbert [place]ix OT OT pager: [place] 03/24/2020, 1:18 PM

## 2020-03-24 NOTE — Plan of Care (Signed)
POC discussed with pt, pt alert and oriented x4, shows no signs of distress, VSS. No complaints of pain during shift. Bed wheels locked, nonskid footwear applied OOB, call bell within reach, encouraged to use call bell for assistance, no falls during shift  Problem: Education: Goal: Knowledge of General Education information will improve Description: Including pain rating scale, medication(s)/side effects and non-pharmacologic comfort measures Outcome: Progressing   Problem: Health Behavior/Discharge Planning: Goal: Ability to manage health-related needs will improve Outcome: Progressing   Problem: Clinical Measurements: Goal: Ability to maintain clinical measurements within normal limits will improve Outcome: Progressing Goal: Will remain free from infection Outcome: Progressing Goal: Diagnostic test results will improve Outcome: Progressing Goal: Respiratory complications will improve Outcome: Progressing Goal: Cardiovascular complication will be avoided Outcome: Progressing   Problem: Activity: Goal: Risk for activity intolerance will decrease Outcome: Progressing   Problem: Nutrition: Goal: Adequate nutrition will be maintained Outcome: Progressing   Problem: Coping: Goal: Level of anxiety will decrease Outcome: Progressing   Problem: Elimination: Goal: Will not experience complications related to bowel motility Outcome: Progressing Goal: Will not experience complications related to urinary retention Outcome: Progressing   Problem: Pain Managment: Goal: General experience of comfort will improve Outcome: Progressing   Problem: Safety: Goal: Ability to remain free from injury will improve Outcome: Progressing   Problem: Skin Integrity: Goal: Risk for impaired skin integrity will decrease Outcome: Progressing

## 2020-03-24 NOTE — Progress Notes (Signed)
PROGRESS NOTE    Kathleen Schneider   XIP:[id number]  DOB: Dec 13, 1940  DOA: 03/20/2020     3  PCP: Donald Prose, MD  CC: SOB  Hospital Course: Kathleen Schneider is a 79 y.o. female with medical history significant for depression, CKD stage IIIb, chronic diastolic CHF, anemia, and recent diagnosis of metastatic breast cancer who presented to the ER with worsening shortness of breath and cough at home.  She had recently been treated approximately 1 month ago for pneumonia and required oxygen during that hospitalization but after completing antibiotics, she was stable on room air at home at discharge. She is significantly anxious and exhibits ongoing depression, mostly situational with her recent diagnosis. She also endorsed a sick contact at home, endorsing one of her aides was sick and coughing around her.   Upon arrival to the ED, patient is found to be afebrile, saturating mid to upper 80s on room air, mildly tachypneic, heart rate around 100, and with stable blood pressure.  EKG features sinus rhythm with LAFB and LAD.  Chest x-ray is stable with no acute findings.  Chemistry panel notable for creatinine 1.39, similar to priors.  CBC demonstrates an improved normocytic anemia with hemoglobin 10.6.  BNP is now normal.  COVID-19 PCR is negative.  Patient was treated with Atrovent, albuterol, Solu-Medrol, and azithromycin.  She continues to drop her saturations to the mid to upper 80s on room air.  D-dimer was also positive.  Procalcitonin negative. She was started on fluids and ordered to undergo CTA chest to rule out PE. The CTA chest was negative for PE. An RVP was obtained also and was positive for metapneumovirus. She was informed and recommended for supportive care.  PT/OT were also consulted due to her frequent falls at home and having difficulty with affording nurse aides at times.  SNF was recommended and patient was amenable with this if able to be approved by insurance.    Interval  History:  No events overnight.  Feeling progressively better since yesterday. Insurance company denied her again for approval for rehab stating she is more appropriate for hospice at this time.  Next steps will possibly be discharging home with home health and hospice for more services.  Old records reviewed in assessment of this patient  ROS: Constitutional: negative for chills and fevers, Respiratory: positive for cough, Cardiovascular: negative except for tachypnea and Gastrointestinal: negative for abdominal pain  Assessment & Plan: * Acute respiratory failure with hypoxia ([place]) -Presents with SOB, cough, and wheezing and is found to have new supplemental O2 requirement -She had a CT during admission in late October with bronchial wall thickening, tree-in-bud nodularity, and small airway impaction suspected secondary to infection, required supplemental O2 during the admission, completed a course of antibiotics, and went home on room air but does not feel that she ever recovered -COVID and influenza negative in ED - check CTA chest = negative for PE - see bronchitis  Acute bronchitis due to human metapneumovirus - likely explains her "cold like" symptoms and congestion - supportive care; no indication for abx or antivirals - d/c steroids - wean O2 as able; if unable needs O2 at discharge  Stage IV carcinoma of breast ([place]) -Stage IV metastatic breast cancer with diffuse sclerotic bone lesion -Recent diagnosis. Concerned about treatment candidacy from oncology perspective due to poor performance status and poor social support - needs PT eval, as patient may not be safe for home; oncology also concerned - oncology also recommending palliative  care/hospice; patient has a component of denial but more accepting of conversations today during discussion  - PT recommending SNF; denied for rehab from insurance; other option is home with H/H and hospice  Chronic diastolic CHF (congestive  heart failure) ([place]) -Appears compensated -BNP now normal, was elevated last admission -Monitor volume status  Normocytic anemia -Hgb is 10.6 on admission -Improved from last month, no bleeding reported  Depression -Continue Lexapro  Chronic kidney disease, stage 3b ([place]) -baseline approx 1.4-1.5, currently at baseline - IVF in anticipation of CTA   Antimicrobials:   DVT prophylaxis: HSQ Code Status: DNR Family Communication: none present Disposition Plan: Status is: Inpatient  Remains inpatient appropriate because:Unsafe d/c plan and Inpatient level of care appropriate due to severity of illness   Dispo: The patient is from: Home              Anticipated d/c is to: home likely given decline from insurance again              Anticipated d/c date is: 1 day              Patient currently is not medically stable to d/c.  Objective: Blood pressure 133/71, pulse 66, temperature 97.7 F (36.5 C), temperature source Oral, resp. rate 18, height 5\' 4"  (1.626 m), weight 73.9 kg, SpO2 100 %.  Examination: General appearance: alert, cooperative, fatigued and no distress Head: Normocephalic, without obvious abnormality, atraumatic Eyes: EOMI Lungs: Coarse breath sounds bilaterally, no wheezing Heart: regular rate and rhythm and S1, S2 normal Abdomen: normal findings: bowel sounds normal Extremities: No edema Skin: mobility and turgor normal Neurologic: Grossly normal  Consultants:     Procedures:     Data Reviewed: I have personally reviewed following labs and imaging studies No results found for this or any previous visit (from the past 24 hour(s)).  Recent Results (from the past 240 hour(s))  Resp Panel by RT-PCR (Flu A&B, Covid) Nasopharyngeal Swab     Status: None   Collection Time: 03/20/20  9:32 PM   Specimen: Nasopharyngeal Swab; Nasopharyngeal(NP) swabs in vial transport medium  Result Value Ref Range Status   SARS Coronavirus 2 by RT PCR  NEGATIVE NEGATIVE Final    Comment: (NOTE) SARS-CoV-2 target nucleic acids are NOT DETECTED.  The SARS-CoV-2 RNA is generally detectable in upper respiratory specimens during the acute phase of infection. The lowest concentration of SARS-CoV-2 viral copies this assay can detect is 138 copies/mL. A negative result does not preclude SARS-Cov-2 infection and should not be used as the sole basis for treatment or other patient management decisions. A negative result may occur with  improper specimen collection/handling, submission of specimen other than nasopharyngeal swab, presence of viral mutation(s) within the areas targeted by this assay, and inadequate number of viral copies(<138 copies/mL). A negative result must be combined with clinical observations, patient history, and epidemiological information. The expected result is Negative.  Fact Sheet for Patients:  EntrepreneurPulse.com.au  Fact Sheet for Healthcare Providers:  IncredibleEmployment.be  This test is no t yet approved or cleared by the [place] FDA and  has been authorized for detection and/or diagnosis of SARS-CoV-2 by FDA under an Emergency Use Authorization (EUA). This EUA will remain  in effect (meaning this test can be used) for the duration of the COVID-19 declaration under Section 564(b)(1) of the Act, 21 U.S.C.section 360bbb-3(b)(1), unless the authorization is terminated  or revoked sooner.       Influenza A by PCR NEGATIVE NEGATIVE Final  Influenza B by PCR NEGATIVE NEGATIVE Final    Comment: (NOTE) The Xpert Xpress SARS-CoV-2/FLU/RSV plus assay is intended as an aid in the diagnosis of influenza from Nasopharyngeal swab specimens and should not be used as a sole basis for treatment. Nasal washings and aspirates are unacceptable for Xpert Xpress SARS-CoV-2/FLU/RSV testing.  Fact Sheet for Patients: EntrepreneurPulse.com.au  Fact Sheet for  Healthcare Providers: IncredibleEmployment.be  This test is not yet approved or cleared by the [place] FDA and has been authorized for detection and/or diagnosis of SARS-CoV-2 by FDA under an Emergency Use Authorization (EUA). This EUA will remain in effect (meaning this test can be used) for the duration of the COVID-19 declaration under Section 564(b)(1) of the Act, 21 U.S.C. section 360bbb-3(b)(1), unless the authorization is terminated or revoked.  Performed at [facility], [place] [address], [place], [place] [address]   Respiratory Panel by PCR     Status: Abnormal   Collection Time: 03/21/20  2:58 PM   Specimen: Nasopharyngeal Swab; Respiratory  Result Value Ref Range Status   Adenovirus NOT DETECTED NOT DETECTED Final   Coronavirus 229E NOT DETECTED NOT DETECTED Final    Comment: (NOTE) The Coronavirus on the Respiratory Panel, DOES NOT test for the novel  Coronavirus (2019 nCoV)    Coronavirus HKU1 NOT DETECTED NOT DETECTED Final   Coronavirus NL63 NOT DETECTED NOT DETECTED Final   Coronavirus OC43 NOT DETECTED NOT DETECTED Final   Metapneumovirus DETECTED (A) NOT DETECTED Final   Rhinovirus / Enterovirus NOT DETECTED NOT DETECTED Final   Influenza A NOT DETECTED NOT DETECTED Final   Influenza B NOT DETECTED NOT DETECTED Final   Parainfluenza Virus 1 NOT DETECTED NOT DETECTED Final   Parainfluenza Virus 2 NOT DETECTED NOT DETECTED Final   Parainfluenza Virus 3 NOT DETECTED NOT DETECTED Final   Parainfluenza Virus 4 NOT DETECTED NOT DETECTED Final   Respiratory Syncytial Virus NOT DETECTED NOT DETECTED Final   Bordetella pertussis NOT DETECTED NOT DETECTED Final   Chlamydophila pneumoniae NOT DETECTED NOT DETECTED Final   Mycoplasma pneumoniae NOT DETECTED NOT DETECTED Final    Comment: Performed at [facility], [address], [place], [place] [address]     Radiology Studies: No results found. CT ANGIO CHEST PE W OR  WO CONTRAST  Final Result    DG Chest Port 1 View  Final Result      Scheduled Meds: . dextromethorphan-guaiFENesin  1 tablet Oral BID  . escitalopram  10 mg Oral Daily  . heparin  5,000 Units Subcutaneous Q8H  . lamoTRIgine  150 mg Oral BID  . lidocaine  2 patch Transdermal Q24H  . oxybutynin  5 mg Oral BID  . pseudoephedrine  60 mg Oral BID  . sodium chloride flush  3 mL Intravenous Q12H   PRN Meds: sodium chloride, acetaminophen **OR** acetaminophen, albuterol, ondansetron **OR** ondansetron (ZOFRAN) IV, polyethylene glycol, sodium chloride flush Continuous Infusions: . sodium chloride       LOS: 3 days  Time spent: Greater than 50% of the 35 minute visit was spent in counseling/coordination of care for the patient as laid out in the A&P.   Dwyane Dee, MD [facility] 03/24/2020, 1:48 PM

## 2020-03-24 NOTE — Progress Notes (Signed)
Physical Therapy Treatment Patient Details Name: Kathleen Schneider MRN: [id number] DOB: 1941/04/13 Today's Date: 03/24/2020    History of Present Illness Pt is a 79 y.o. female with medical history significant for depression, CKD stage IIIb, chronic diastolic CHF, anemia, and recent diagnosis of metastatic breast cancer who presented to the ER with worsening shortness of breath and cough at home.  Pt admitted with acute resp failure with hypoxia due to acute bronchitis and metapneumovirus.    PT Comments    Pt OOB in recliner.  Assisted with amb.  General Gait Details: required increased assist this session and decreased distance due to weakness.  In fact I had her in the hallway and after 12 feet B knees started to buckle.  Called for assistance to get recliner from room as I hel her up.  "my legs are weak".  Last session pt amb 30 feet at [place].   Follow Up Recommendations  SNF     Equipment Recommendations  Wheelchair cushion (measurements PT);Wheelchair (measurements PT)    Recommendations for Other Services       Precautions / Restrictions Precautions Precautions: Fall    Mobility  Bed Mobility               General bed mobility comments: OOB in recliner  Transfers Overall transfer level: Needs assistance Equipment used: Rolling walker (2 wheeled) Transfers: Sit to/from [facility] Sit to Stand: Min assist Stand pivot transfers: Min assist;Mod assist       General transfer comment: VC's on proper hand placement to avoid pulling up on walker and increased time  Ambulation/Gait Ambulation/Gait assistance: Mod assist;Max assist Gait Distance (Feet): 18 Feet Assistive device: Rolling walker (2 wheeled) Gait Pattern/deviations: Step-to pattern;Decreased stride length;Shuffle Gait velocity: decreased   General Gait Details: required increased assist this session and decreased distance due to weakness.  In fact I had her in the hallway and  after 12 feet B knees started to buckle.  Called for assistance to get recliner from room as I hel her up.  "my legs are weak".  Last session pt amb 30 feet at [employer].   Stairs             Wheelchair Mobility    Modified Rankin (Stroke Patients Only)       Balance                                            Cognition Arousal/Alertness: Awake/alert   Overall Cognitive Status: Within Functional Limits for tasks assessed                                 General Comments: AxO x 3 very pleasant      Exercises      General Comments        Pertinent Vitals/Pain      Home Living                      Prior Function            PT Goals (current goals can now be found in the care plan section) Progress towards PT goals: Progressing toward goals    Frequency    Min 3X/week      PT Plan Current plan remains appropriate  Co-evaluation              AM-PAC PT "6 Clicks" Mobility   Outcome Measure  Help needed turning from your back to your side while in a flat bed without using bedrails?: A Little Help needed moving from lying on your back to sitting on the side of a flat bed without using bedrails?: A Little Help needed moving to and from a bed to a chair (including a wheelchair)?: A Little Help needed standing up from a chair using your arms (e.g., wheelchair or bedside chair)?: A Little Help needed to walk in hospital room?: A Lot Help needed climbing 3-5 steps with a railing? : A Lot 6 Click Score: 16    End of Session Equipment Utilized During Treatment: Gait belt Activity Tolerance: Patient limited by fatigue Patient left: with chair alarm set;in chair;with call bell/phone within reach Nurse Communication: Mobility status PT Visit Diagnosis: Other abnormalities of gait and mobility (R26.89);Muscle weakness (generalized) (M62.81);History of falling (Z91.81)     Time: 8916-9450 PT Time  Calculation (min) (ACUTE ONLY): 10 min  Charges:  $Gait Training: 8-22 mins                     Rica Koyanagi  PTA Acute  Rehabilitation Services Pager      [phone] Office      [phone]

## 2020-03-24 NOTE — Progress Notes (Signed)
COMMUNITY PALLIATIVE CARE SW NOTE  PATIENT NAME: Kathleen Schneider DOB: 02/11/1941 MRN: [id number]  PRIMARY CARE PROVIDER: Donald Prose, MD  RESPONSIBLE PARTY:  Acct ID - Guarantor Home Phone Work Phone Relationship Acct Type  [id number] [facility]* [phone] Self P/F     [place], [place], [place] [address]     PLAN OF CARE and INTERVENTIONS:             1. GOALS OF CARE/ ADVANCE CARE PLANNING:  Patient's goals to be further assessed. Patient is a DNR. 2. SOCIAL/EMOTIONAL/SPIRITUAL ASSESSMENT/ INTERVENTIONS:  SW completed an initial visit with patient at her home. She was present with her sitter-[place]. Patient was vomiting and experiencing some nausea and weakness. Patient had soiled her clothes and SW had to wait until her sitter assisted with changing her. SW introduced herself and reiterated the role of palliative care. SW also follow-up with her on her request for and NP. SW advised her that based on her discharge summary, she was recommended to have an oncology follow-up. Patient stated that she was aware, but had not way to get there. SW offered to assist her with transportation. Patient then stated that she was unable to continue with the visit because she was not feeling well. She asked SW to check back with her another time to schedule another visit.  3. PATIENT/CAREGIVER EDUCATION/ COPING:  Patient is alert and oriented x3. She seems to be coping adequately.  4. PERSONAL EMERGENCY PLAN:  911 should be activated for emergencies.  5. COMMUNITY RESOURCES COORDINATION/ HEALTH CARE NAVIGATION:  Patient is receiving therapy through [facility]. She has 24 hour sitters.  6. FINANCIAL/LEGAL CONCERNS/INTERVENTIONS:  To be assessed.     SOCIAL HX:  Social History   Tobacco Use  . Smoking status: Former Research scientist (life sciences)  . Smokeless tobacco: Never Used  Substance Use Topics  . Alcohol use: Yes    Alcohol/week: 0.0 standard drinks    Comment: Rare--about twice per  yr/fim    CODE STATUS: DNR, form needed ADVANCED DIRECTIVES:  MOST FORM COMPLETE:  Yes HOSPICE EDUCATION PROVIDED: None  PPS: Patient is alert and oriented x3. She requires assistance with personal care, household chores and medications.   Duration of visit and documentation: 45 minutes      Katheren Puller, LCSW

## 2020-03-25 MED ORDER — GUAIFENESIN-DM 100-10 MG/5ML PO SYRP
5.0000 mL | ORAL_SOLUTION | ORAL | Status: DC | PRN
  Administered 2020-03-25: 5 mL via ORAL
  Filled 2020-03-25: qty 10

## 2020-03-25 NOTE — Progress Notes (Signed)
Received referral from [facility] for home with hospice at discharge. Originally family would like SNF placement but was denied due to patient custodial care needs.   Spoke with caregiver [name] who is OK now with patient going back home and will just try to continue caring hands care within the home on top of hospice care. [name] states that if she does not answer her phone to please leave a message on her cell. Plan is to possibly discharge Monday if medically stable. No DME is needed prior to discharge, but caregiver would like a mattress overlay for current bed. I have reached out to our team on this.   [facility] numbers have been provided for caregiver.   Thank you for the pleasure of taking care of this patient.  Clementeen Hoof, BSN, [facility] (in [place]) [phone]

## 2020-03-25 NOTE — Progress Notes (Signed)
PROGRESS NOTE    SHAIANN MCMANAMON   HKV:[id number]  DOB: 05-16-1940  DOA: 03/20/2020     4  PCP: Donald Prose, MD  CC: SOB  Hospital Course: MELVIA MATOUSEK is a 79 y.o. female with medical history significant for depression, CKD stage IIIb, chronic diastolic CHF, anemia, and recent diagnosis of metastatic breast cancer who presented to the ER with worsening shortness of breath and cough at home.  She had recently been treated approximately 1 month ago for pneumonia and required oxygen during that hospitalization but after completing antibiotics, she was stable on room air at home at discharge. She is significantly anxious and exhibits ongoing depression, mostly situational with her recent diagnosis. She also endorsed a sick contact at home, endorsing one of her aides was sick and coughing around her.   Upon arrival to the ED, patient is found to be afebrile, saturating mid to upper 80s on room air, mildly tachypneic, heart rate around 100, and with stable blood pressure.  EKG features sinus rhythm with LAFB and LAD.  Chest x-ray is stable with no acute findings.  Chemistry panel notable for creatinine 1.39, similar to priors.  CBC demonstrates an improved normocytic anemia with hemoglobin 10.6.  BNP is now normal.  COVID-19 PCR is negative.  Patient was treated with Atrovent, albuterol, Solu-Medrol, and azithromycin.  She continues to drop her saturations to the mid to upper 80s on room air.  D-dimer was also positive.  Procalcitonin negative. She was started on fluids and ordered to undergo CTA chest to rule out PE. The CTA chest was negative for PE. An RVP was obtained also and was positive for metapneumovirus. She was informed and recommended for supportive care.  PT/OT were also consulted due to her frequent falls at home and having difficulty with affording nurse aides at times.  SNF was recommended and patient was amenable with this if able to be approved by insurance.  She was denied  again from insurance for approval for rehab.  Palliative care was also consulted while she was hospitalized.   Interval History:  No events overnight.  We talked about the possibility of going home since insurance denied request. She's nervous and worried that she doesn't have the care/support she needs at home when alone.  She will be evaluated by palliative care today as well.   Old records reviewed in assessment of this patient  ROS: Constitutional: negative for chills and fevers, Respiratory: positive for cough, Cardiovascular: negative except for tachypnea and Gastrointestinal: negative for abdominal pain  Assessment & Plan: * Acute respiratory failure with hypoxia ([place]) -Presents with SOB, cough, and wheezing and is found to have new supplemental O2 requirement -She had a CT during admission in late October with bronchial wall thickening, tree-in-bud nodularity, and small airway impaction suspected secondary to infection, required supplemental O2 during the admission, completed a course of antibiotics, and went home on room air but does not feel that she ever recovered -COVID and influenza negative in ED - check CTA chest = negative for PE - see bronchitis  Acute bronchitis due to human metapneumovirus - likely explains her "cold like" symptoms and congestion - supportive care; no indication for abx or antivirals - d/c steroids - wean O2 as able; if unable needs O2 at discharge  Stage IV carcinoma of breast ([place]) -Stage IV metastatic breast cancer with diffuse sclerotic bone lesion -Recent diagnosis. Concerned about treatment candidacy from oncology perspective due to poor performance status and poor social support -  needs PT eval, as patient may not be safe for home; oncology also concerned - oncology also recommending palliative care/hospice; patient has a component of denial but more accepting of conversations today during discussion  - PT recommending SNF; denied for  rehab from insurance; other option is home with H/H and hospice - follow up palliative care consult placed on 40/10  Chronic diastolic CHF (congestive heart failure) ([place]) -Appears compensated -BNP now normal, was elevated last admission -Monitor volume status  Normocytic anemia -Hgb is 10.6 on admission -Improved from last month, no bleeding reported  Depression -Continue Lexapro  Chronic kidney disease, stage 3b ([facility]) -baseline approx 1.4-1.5, currently at baseline - IVF in anticipation of CTA   Antimicrobials:   DVT prophylaxis: HSQ Code Status: DNR Family Communication: none present Disposition Plan: Status is: Inpatient  Remains inpatient appropriate because:Unsafe d/c plan and Inpatient level of care appropriate due to severity of illness   Dispo: The patient is from: Home              Anticipated d/c is to: home likely given decline from insurance again              Anticipated d/c date is: 1 day              Patient currently is medically stable to d/c. Not a safe discharge in regards to patient safety at home with recurrent falls and weakness  Objective: Blood pressure 137/70, pulse 80, temperature 99.1 F (37.3 C), resp. rate 20, height 5\' 4"  (1.626 m), weight 73.9 kg, SpO2 90 %.  Examination: General appearance: alert, cooperative, fatigued and no distress Head: Normocephalic, without obvious abnormality, atraumatic Eyes: EOMI Lungs: Coarse breath sounds bilaterally, no wheezing Heart: regular rate and rhythm and S1, S2 normal Abdomen: normal findings: bowel sounds normal Extremities: No edema Skin: mobility and turgor normal Neurologic: Grossly normal  Consultants:   Palliative care  Procedures:     Data Reviewed: I have personally reviewed following labs and imaging studies No results found for this or any previous visit (from the past 24 hour(s)).  Recent Results (from the past 240 hour(s))  Resp Panel by RT-PCR (Flu A&B,  Covid) Nasopharyngeal Swab     Status: None   Collection Time: 03/20/20  9:32 PM   Specimen: Nasopharyngeal Swab; Nasopharyngeal(NP) swabs in vial transport medium  Result Value Ref Range Status   SARS Coronavirus 2 by RT PCR NEGATIVE NEGATIVE Final    Comment: (NOTE) SARS-CoV-2 target nucleic acids are NOT DETECTED.  The SARS-CoV-2 RNA is generally detectable in upper respiratory specimens during the acute phase of infection. The lowest concentration of SARS-CoV-2 viral copies this assay can detect is 138 copies/mL. A negative result does not preclude SARS-Cov-2 infection and should not be used as the sole basis for treatment or other patient management decisions. A negative result may occur with  improper specimen collection/handling, submission of specimen other than nasopharyngeal swab, presence of viral mutation(s) within the areas targeted by this assay, and inadequate number of viral copies(<138 copies/mL). A negative result must be combined with clinical observations, patient history, and epidemiological information. The expected result is Negative.  Fact Sheet for Patients:  EntrepreneurPulse.com.au  Fact Sheet for Healthcare Providers:  IncredibleEmployment.be  This test is no t yet approved or cleared by the [place] FDA and  has been authorized for detection and/or diagnosis of SARS-CoV-2 by FDA under an Emergency Use Authorization (EUA). This EUA will remain  in effect (meaning this test  can be used) for the duration of the COVID-19 declaration under Section 564(b)(1) of the Act, 21 U.S.C.section 360bbb-3(b)(1), unless the authorization is terminated  or revoked sooner.       Influenza A by PCR NEGATIVE NEGATIVE Final   Influenza B by PCR NEGATIVE NEGATIVE Final    Comment: (NOTE) The Xpert Xpress SARS-CoV-2/FLU/RSV plus assay is intended as an aid in the diagnosis of influenza from Nasopharyngeal swab specimens and should  not be used as a sole basis for treatment. Nasal washings and aspirates are unacceptable for Xpert Xpress SARS-CoV-2/FLU/RSV testing.  Fact Sheet for Patients: EntrepreneurPulse.com.au  Fact Sheet for Healthcare Providers: IncredibleEmployment.be  This test is not yet approved or cleared by the [place] FDA and has been authorized for detection and/or diagnosis of SARS-CoV-2 by FDA under an Emergency Use Authorization (EUA). This EUA will remain in effect (meaning this test can be used) for the duration of the COVID-19 declaration under Section 564(b)(1) of the Act, 21 U.S.C. section 360bbb-3(b)(1), unless the authorization is terminated or revoked.  Performed at [facility], [place] [address] [place], [place] [address]   Respiratory Panel by PCR     Status: Abnormal   Collection Time: 03/21/20  2:58 PM   Specimen: Nasopharyngeal Swab; Respiratory  Result Value Ref Range Status   Adenovirus NOT DETECTED NOT DETECTED Final   Coronavirus 229E NOT DETECTED NOT DETECTED Final    Comment: (NOTE) The Coronavirus on the Respiratory Panel, DOES NOT test for the novel  Coronavirus (2019 nCoV)    Coronavirus HKU1 NOT DETECTED NOT DETECTED Final   Coronavirus NL63 NOT DETECTED NOT DETECTED Final   Coronavirus OC43 NOT DETECTED NOT DETECTED Final   Metapneumovirus DETECTED (A) NOT DETECTED Final   Rhinovirus / Enterovirus NOT DETECTED NOT DETECTED Final   Influenza A NOT DETECTED NOT DETECTED Final   Influenza B NOT DETECTED NOT DETECTED Final   Parainfluenza Virus 1 NOT DETECTED NOT DETECTED Final   Parainfluenza Virus 2 NOT DETECTED NOT DETECTED Final   Parainfluenza Virus 3 NOT DETECTED NOT DETECTED Final   Parainfluenza Virus 4 NOT DETECTED NOT DETECTED Final   Respiratory Syncytial Virus NOT DETECTED NOT DETECTED Final   Bordetella pertussis NOT DETECTED NOT DETECTED Final   Chlamydophila pneumoniae NOT DETECTED NOT  DETECTED Final   Mycoplasma pneumoniae NOT DETECTED NOT DETECTED Final    Comment: Performed at [facility], [address], [place], [place] [address]     Radiology Studies: No results found. CT ANGIO CHEST PE W OR WO CONTRAST  Final Result    DG Chest Port 1 View  Final Result      Scheduled Meds: . dextromethorphan-guaiFENesin  1 tablet Oral BID  . escitalopram  10 mg Oral Daily  . heparin  5,000 Units Subcutaneous Q8H  . lamoTRIgine  150 mg Oral BID  . lidocaine  2 patch Transdermal Q24H  . oxybutynin  5 mg Oral BID  . pseudoephedrine  60 mg Oral BID  . sodium chloride flush  3 mL Intravenous Q12H   PRN Meds: sodium chloride, acetaminophen **OR** acetaminophen, albuterol, guaiFENesin-dextromethorphan, ondansetron **OR** ondansetron (ZOFRAN) IV, polyethylene glycol, sodium chloride flush Continuous Infusions: . sodium chloride       LOS: 4 days  Time spent: Greater than 50% of the 35 minute visit was spent in counseling/coordination of care for the patient as laid out in the A&P.   Dwyane Dee, MD [facility] 03/25/2020, 11:51 AM

## 2020-03-25 NOTE — Care Management Important Message (Signed)
Important Message  Patient Details IM Letter given to the Patient.  Name: Kathleen Schneider MRN: [id number] Date of Birth: 07/31/1940   Medicare Important Message Given:  Yes     Kerin Salen 03/25/2020, 10:26 AM

## 2020-03-25 NOTE — Progress Notes (Signed)
Patient having expiratory wheezing, mild cough and aching pain in right hip. Initiated order to administer robitussin and administered PRN tylenol and albuterol inhaler. Will continue to monitor the patient.

## 2020-03-26 DIAGNOSIS — C50919 Malignant neoplasm of unspecified site of unspecified female breast: Secondary | ICD-10-CM

## 2020-03-26 NOTE — Progress Notes (Signed)
Manufacturing engineer [facility])  Hospital liaison spoke with [name], patient's friend, to answer questions and to arrange admission and assessment visit. Per discussion with TOC Elmyra Ricks the plan is to discharge home today.  [facility] will be able to see pt tomorrow for a 1pm admissions and assessment visit.    [place] send signed and completed DNR home with pt/family.  Please provide prescriptions at discharge as needed to ensure ongoing symptom management until pt can be admitted onto hospice services.    [facility] is not able to order a gel overlay mattress for a personal bed, although we can order this for a hospital bed.  Per notes pt decline hospital bed at this time.  DME deferred to hospice case manager for follow up.  [facility] information and contact numbers left on voicemail for friend [name].  Above information shared with Evorn Gong Manager.  Please call with any questions or concerns.  Thank you for the opportunity to participate in this pt's care.  Domenic Moras, BSN, RN [employer] [phone]

## 2020-03-26 NOTE — Discharge Summary (Signed)
Physician Discharge Summary   Kathleen Schneider VZD:[id number] DOB: [birth year] DOA: 03/20/2020  PCP: Kathleen Prose, MD  Admit date: 03/20/2020 Discharge date: 03/26/2020  Admitted From: homt Disposition:  Home with hospice Discharging physician: Dwyane Dee, MD  Recommendations for Outpatient Follow-up:  1. Patient needs as much assistance/care at home as possible. With ongoing anticipated decline, suspect at some point she may need long term care if unable to safely remain at home.  2. Continue care with hospice on discharge   Patient discharged to home in Discharge Condition: fair CODE STATUS: DNR Diet recommendation:  Diet Orders (From admission, onward)    Start     Ordered   03/26/20 0000  Diet - low sodium heart healthy        03/26/20 1132   03/25/20 0000  Diet - low sodium heart healthy        03/25/20 0926   03/21/20 0033  Diet Heart Room service appropriate? Yes; Fluid consistency: Thin  Diet effective now       Question Answer Comment  Room service appropriate? Yes   Fluid consistency: Thin      03/21/20 0034          Hospital Course: [name] is a 79 y.o. female with medical history significant for depression, CKD stage IIIb, chronic diastolic CHF, anemia, and recent diagnosis of metastatic breast cancer who presented to the ER with worsening shortness of breath and cough at home.  She had recently been treated approximately 1 month ago for pneumonia and required oxygen during that hospitalization but after completing antibiotics, she was stable on room air at home at discharge. She is significantly anxious and exhibits ongoing depression, mostly situational with her recent diagnosis. She also endorsed a sick contact at home, endorsing one of her aides was sick and coughing around her.   Upon arrival to the ED, patient is found to be afebrile, saturating mid to upper 80s on room air, mildly tachypneic, heart rate around 100, and with stable blood pressure.   EKG features sinus rhythm with LAFB and LAD.  Chest x-ray is stable with no acute findings.  Chemistry panel notable for creatinine 1.39, similar to priors.  CBC demonstrates an improved normocytic anemia with hemoglobin 10.6.  BNP is now normal.  COVID-19 PCR is negative.  Patient was treated with Atrovent, albuterol, Solu-Medrol, and azithromycin.  She continues to drop her saturations to the mid to upper 80s on room air.  D-dimer was also positive.  Procalcitonin negative. She was started on fluids and ordered to undergo CTA chest to rule out PE. The CTA chest was negative for PE. An RVP was obtained also and was positive for metapneumovirus. She was informed and recommended for supportive care.  PT/OT were also consulted due to her frequent falls at home and having difficulty with affording nurse aides at times.  SNF was recommended and patient was amenable with this if able to be approved by insurance.  She was denied again from insurance for approval for rehab.  Palliative care was also consulted while she was hospitalized.  After conversation, tentative plan is for patient to discharge home with hospice in place and possibly private duty custodial care.  She was also arranged for EMS transport to home at time of discharge given her inability to ambulate up steps as well as enough distance from her door to her room.   * Acute respiratory failure with hypoxia (HCC) -Presents with SOB, cough, and wheezing and is  found to have new supplemental O2 requirement -She had a CT during admission in late October with bronchial wall thickening, tree-in-bud nodularity, and small airway impaction suspected secondary to infection, required supplemental O2 during the admission, completed a course of antibiotics, and went home on room air but does not feel that she ever recovered -COVID and influenza negative in ED - check CTA chest = negative for PE - see bronchitis  Acute bronchitis due to human  metapneumovirus - likely explains her "cold like" symptoms and congestion - supportive care; no indication for abx or antivirals - d/c steroids - wean O2 as able; if unable needs O2 at discharge; weaned to room air prior to discharge  Stage IV carcinoma of breast ([place]) -Stage IV metastatic breast cancer with diffuse sclerotic bone lesion -Recent diagnosis. Concerned about treatment candidacy from oncology perspective due to poor performance status and poor social support - needs PT eval, as patient may not be safe for home; oncology also concerned - oncology also recommending palliative care/hospice; patient has a component of denial but more accepting of conversations as they were repeated - PT recommending SNF; denied for rehab from insurance -Patient met with palliative care on 03/25/2020.  Plan is to discharge home with hospice in place and ongoing private care  Chronic diastolic CHF (congestive heart failure) ([place]) -Appears compensated -BNP now normal, was elevated last admission -Monitor volume status  Normocytic anemia -Hgb is 10.6 on admission -Improved from last month, no bleeding reported  Depression -Continue Lexapro  Chronic kidney disease, stage 3b (HCC) -baseline approx 1.4-1.5, currently at baseline - IVF in anticipation of CTA    Principal Diagnosis: Acute respiratory failure with hypoxia [facility])  Discharge Diagnoses: Active Hospital Problems   Diagnosis Date Noted  . Acute respiratory failure with hypoxia ([place]) 03/21/2020    Priority: High  . Acute bronchitis due to human metapneumovirus 03/22/2020    Priority: High  . Stage IV carcinoma of breast ([place])     Priority: High  . Chronic kidney disease, stage 3b ([place]) 03/21/2020  . Depression 03/21/2020  . Normocytic anemia 03/21/2020  . Chronic diastolic CHF (congestive heart failure) ([place]) 03/21/2020    Resolved Hospital Problems  No resolved problems to display.    Discharge  Instructions    Diet - low sodium heart healthy   Complete by: As directed    Diet - low sodium heart healthy   Complete by: As directed    Increase activity slowly   Complete by: As directed    Increase activity slowly   Complete by: As directed      Allergies as of 03/26/2020   No Known Allergies     Medication List    TAKE these medications   acetaminophen 500 MG tablet Commonly known as: TYLENOL Take 1 tablet (500 mg total) by mouth every 6 (six) hours as needed for mild pain, fever or headache.   albuterol 108 (90 Base) MCG/ACT inhaler Commonly known as: VENTOLIN HFA Inhale 2 puffs into the lungs every 4 (four) hours as needed for wheezing or shortness of breath.   escitalopram 10 MG tablet Commonly known as: LEXAPRO Take 1 tablet (10 mg total) by mouth daily.   lamoTRIgine 150 MG tablet Commonly known as: LAMICTAL Take 1 tablet (150 mg total) by mouth 2 (two) times daily.   lidocaine 5 % Commonly known as: LIDODERM Place 2 patches onto the skin daily. Remove & Discard patch within 12 hours or as directed by MD  meloxicam 7.5 MG tablet Commonly known as: MOBIC Take 1 tablet (7.5 mg total) by mouth daily as needed for pain.   oxybutynin 5 MG tablet Commonly known as: DITROPAN Take 1 tablet (5 mg total) by mouth 2 (two) times daily.            Durable Medical Equipment  (From admission, onward)         Start     Ordered   03/25/20 1240  For home use only DME oxygen  Once       Question Answer Comment  Length of Need 6 Months   Liters per Minute 2   Oxygen delivery system Gas      03/25/20 1240          No Known Allergies  Consultations: PCM  Discharge Exam: BP 138/71 (BP Location: Right Arm)   Pulse 71   Temp 98 F (36.7 C) (Oral)   Resp 16   Ht 5' 4"  (1.626 m)   Wt 73.9 kg   SpO2 90%   BMI 27.96 kg/m  General appearance: alert, cooperative, fatigued and no distress Head: Normocephalic, without obvious abnormality,  atraumatic Eyes: EOMI Lungs: Coarse breath sounds bilaterally, no wheezing Heart: regular rate and rhythm and S1, S2 normal Abdomen: normal findings: bowel sounds normal Extremities: No edema Skin: mobility and turgor normal Neurologic: Grossly normal  The results of significant diagnostics from this hospitalization (including imaging, microbiology, ancillary and laboratory) are listed below for reference.   Microbiology: Recent Results (from the past 240 hour(s))  Resp Panel by RT-PCR (Flu A&B, Covid) Nasopharyngeal Swab     Status: None   Collection Time: 03/20/20  9:32 PM   Specimen: Nasopharyngeal Swab; Nasopharyngeal(NP) swabs in vial transport medium  Result Value Ref Range Status   SARS Coronavirus 2 by RT PCR NEGATIVE NEGATIVE Final    Comment: (NOTE) SARS-CoV-2 target nucleic acids are NOT DETECTED.  The SARS-CoV-2 RNA is generally detectable in upper respiratory specimens during the acute phase of infection. The lowest concentration of SARS-CoV-2 viral copies this assay can detect is 138 copies/mL. A negative result does not preclude SARS-Cov-2 infection and should not be used as the sole basis for treatment or other patient management decisions. A negative result may occur with  improper specimen collection/handling, submission of specimen other than nasopharyngeal swab, presence of viral mutation(s) within the areas targeted by this assay, and inadequate number of viral copies(<138 copies/mL). A negative result must be combined with clinical observations, patient history, and epidemiological information. The expected result is Negative.  Fact Sheet for Patients:  EntrepreneurPulse.com.au  Fact Sheet for Healthcare Providers:  IncredibleEmployment.be  This test is no t yet approved or cleared by the [place] FDA and  has been authorized for detection and/or diagnosis of SARS-CoV-2 by FDA under an Emergency Use Authorization  (EUA). This EUA will remain  in effect (meaning this test can be used) for the duration of the COVID-19 declaration under Section 564(b)(1) of the Act, 21 U.S.C.section 360bbb-3(b)(1), unless the authorization is terminated  or revoked sooner.       Influenza A by PCR NEGATIVE NEGATIVE Final   Influenza B by PCR NEGATIVE NEGATIVE Final    Comment: (NOTE) The Xpert Xpress SARS-CoV-2/FLU/RSV plus assay is intended as an aid in the diagnosis of influenza from Nasopharyngeal swab specimens and should not be used as a sole basis for treatment. Nasal washings and aspirates are unacceptable for Xpert Xpress SARS-CoV-2/FLU/RSV testing.  Fact Sheet for Patients: EntrepreneurPulse.com.au  Fact Sheet for Healthcare Providers: IncredibleEmployment.be  This test is not yet approved or cleared by the [place] FDA and has been authorized for detection and/or diagnosis of SARS-CoV-2 by FDA under an Emergency Use Authorization (EUA). This EUA will remain in effect (meaning this test can be used) for the duration of the COVID-19 declaration under Section 564(b)(1) of the Act, 21 U.S.C. section 360bbb-3(b)(1), unless the authorization is terminated or revoked.  Performed at [facility], [place] [address] [place], [place] [address]   Respiratory Panel by PCR     Status: Abnormal   Collection Time: 03/21/20  2:58 PM   Specimen: Nasopharyngeal Swab; Respiratory  Result Value Ref Range Status   Adenovirus NOT DETECTED NOT DETECTED Final   Coronavirus 229E NOT DETECTED NOT DETECTED Final    Comment: (NOTE) The Coronavirus on the Respiratory Panel, DOES NOT test for the novel  Coronavirus (2019 nCoV)    Coronavirus HKU1 NOT DETECTED NOT DETECTED Final   Coronavirus NL63 NOT DETECTED NOT DETECTED Final   Coronavirus OC43 NOT DETECTED NOT DETECTED Final   Metapneumovirus DETECTED (A) NOT DETECTED Final   Rhinovirus / Enterovirus NOT  DETECTED NOT DETECTED Final   Influenza A NOT DETECTED NOT DETECTED Final   Influenza B NOT DETECTED NOT DETECTED Final   Parainfluenza Virus 1 NOT DETECTED NOT DETECTED Final   Parainfluenza Virus 2 NOT DETECTED NOT DETECTED Final   Parainfluenza Virus 3 NOT DETECTED NOT DETECTED Final   Parainfluenza Virus 4 NOT DETECTED NOT DETECTED Final   Respiratory Syncytial Virus NOT DETECTED NOT DETECTED Final   Bordetella pertussis NOT DETECTED NOT DETECTED Final   Chlamydophila pneumoniae NOT DETECTED NOT DETECTED Final   Mycoplasma pneumoniae NOT DETECTED NOT DETECTED Final    Comment: Performed at [facility], [address], [place], [place] [address]     Labs: BNP (last 3 results) Recent Labs    02/03/20 1812 02/04/20 2030 03/20/20 2215  BNP 382.3* 557.5* 22.4   Basic Metabolic Panel: Recent Labs  Lab 03/20/20 2215 03/21/20 0417 03/22/20 0359 03/23/20 0411  NA 135 134* 133* 136  K 4.1 5.0 4.6 4.7  CL 101 100 101 101  CO2 22 21* 23 26  GLUCOSE 157* 207* 118* 86  BUN 20 23 21  31*  CREATININE 1.39* 1.51* 1.24* 1.62*  CALCIUM 8.9 8.5* 8.1* 8.9  MG  --   --  2.3 2.4   Liver Function Tests: Recent Labs  Lab 03/20/20 2215  AST 12*  ALT 8  ALKPHOS 64  BILITOT 0.4  PROT 7.9  ALBUMIN 3.5   No results for input(s): LIPASE, AMYLASE in the last 168 hours. No results for input(s): AMMONIA in the last 168 hours. CBC: Recent Labs  Lab 03/20/20 2215 03/21/20 0417 03/22/20 0359 03/23/20 0411  WBC 6.9 5.2 8.0 5.7  NEUTROABS  --   --  6.2 3.8  HGB 10.6* 10.4* 9.1* 10.0*  HCT 35.1* 33.6* 29.4* 33.2*  MCV 92.6 92.6 91.9 93.5  PLT 278 276 275 285   Cardiac Enzymes: No results for input(s): CKTOTAL, CKMB, CKMBINDEX, TROPONINI in the last 168 hours. BNP: Invalid input(s): POCBNP CBG: No results for input(s): GLUCAP in the last 168 hours. D-Dimer No results for input(s): DDIMER in the last 72 hours. Hgb A1c No results for input(s): HGBA1C in the last 72  hours. Lipid Profile No results for input(s): CHOL, HDL, LDLCALC, TRIG, CHOLHDL, LDLDIRECT in the last 72 hours. Thyroid function studies No results  for input(s): TSH, T4TOTAL, T3FREE, THYROIDAB in the last 72 hours.  Invalid input(s): FREET3 Anemia work up No results for input(s): VITAMINB12, FOLATE, FERRITIN, TIBC, IRON, RETICCTPCT in the last 72 hours. Urinalysis    Component Value Date/Time   COLORURINE AMBER (A) 02/03/2020 1812   APPEARANCEUR HAZY (A) 02/03/2020 1812   LABSPEC 1.020 02/03/2020 1812   PHURINE 5.0 02/03/2020 1812   GLUCOSEU NEGATIVE 02/03/2020 1812   HGBUR MODERATE (A) 02/03/2020 1812   BILIRUBINUR NEGATIVE 02/03/2020 1812   KETONESUR 5 (A) 02/03/2020 1812   PROTEINUR 100 (A) 02/03/2020 1812   NITRITE NEGATIVE 02/03/2020 1812   LEUKOCYTESUR TRACE (A) 02/03/2020 1812   Sepsis Labs Invalid input(s): PROCALCITONIN,  WBC,  LACTICIDVEN Microbiology Recent Results (from the past 240 hour(s))  Resp Panel by RT-PCR (Flu A&B, Covid) Nasopharyngeal Swab     Status: None   Collection Time: 03/20/20  9:32 PM   Specimen: Nasopharyngeal Swab; Nasopharyngeal(NP) swabs in vial transport medium  Result Value Ref Range Status   SARS Coronavirus 2 by RT PCR NEGATIVE NEGATIVE Final    Comment: (NOTE) SARS-CoV-2 target nucleic acids are NOT DETECTED.  The SARS-CoV-2 RNA is generally detectable in upper respiratory specimens during the acute phase of infection. The lowest concentration of SARS-CoV-2 viral copies this assay can detect is 138 copies/mL. A negative result does not preclude SARS-Cov-2 infection and should not be used as the sole basis for treatment or other patient management decisions. A negative result may occur with  improper specimen collection/handling, submission of specimen other than nasopharyngeal swab, presence of viral mutation(s) within the areas targeted by this assay, and inadequate number of viral copies(<138 copies/mL). A negative result must be  combined with clinical observations, patient history, and epidemiological information. The expected result is Negative.  Fact Sheet for Patients:  EntrepreneurPulse.com.au  Fact Sheet for Healthcare Providers:  IncredibleEmployment.be  This test is no t yet approved or cleared by the [place] FDA and  has been authorized for detection and/or diagnosis of SARS-CoV-2 by FDA under an Emergency Use Authorization (EUA). This EUA will remain  in effect (meaning this test can be used) for the duration of the COVID-19 declaration under Section 564(b)(1) of the Act, 21 U.S.C.section 360bbb-3(b)(1), unless the authorization is terminated  or revoked sooner.       Influenza A by PCR NEGATIVE NEGATIVE Final   Influenza B by PCR NEGATIVE NEGATIVE Final    Comment: (NOTE) The Xpert Xpress SARS-CoV-2/FLU/RSV plus assay is intended as an aid in the diagnosis of influenza from Nasopharyngeal swab specimens and should not be used as a sole basis for treatment. Nasal washings and aspirates are unacceptable for Xpert Xpress SARS-CoV-2/FLU/RSV testing.  Fact Sheet for Patients: EntrepreneurPulse.com.au  Fact Sheet for Healthcare Providers: IncredibleEmployment.be  This test is not yet approved or cleared by the [place] FDA and has been authorized for detection and/or diagnosis of SARS-CoV-2 by FDA under an Emergency Use Authorization (EUA). This EUA will remain in effect (meaning this test can be used) for the duration of the COVID-19 declaration under Section 564(b)(1) of the Act, 21 U.S.C. section 360bbb-3(b)(1), unless the authorization is terminated or revoked.  Performed at [facility], [place] [address] [place], [place] [address]   Respiratory Panel by PCR     Status: Abnormal   Collection Time: 03/21/20  2:58 PM   Specimen: Nasopharyngeal Swab; Respiratory  Result Value Ref Range  Status   Adenovirus NOT DETECTED NOT DETECTED Final   Coronavirus  229E NOT DETECTED NOT DETECTED Final    Comment: (NOTE) The Coronavirus on the Respiratory Panel, DOES NOT test for the novel  Coronavirus (2019 nCoV)    Coronavirus HKU1 NOT DETECTED NOT DETECTED Final   Coronavirus NL63 NOT DETECTED NOT DETECTED Final   Coronavirus OC43 NOT DETECTED NOT DETECTED Final   Metapneumovirus DETECTED (A) NOT DETECTED Final   Rhinovirus / Enterovirus NOT DETECTED NOT DETECTED Final   Influenza A NOT DETECTED NOT DETECTED Final   Influenza B NOT DETECTED NOT DETECTED Final   Parainfluenza Virus 1 NOT DETECTED NOT DETECTED Final   Parainfluenza Virus 2 NOT DETECTED NOT DETECTED Final   Parainfluenza Virus 3 NOT DETECTED NOT DETECTED Final   Parainfluenza Virus 4 NOT DETECTED NOT DETECTED Final   Respiratory Syncytial Virus NOT DETECTED NOT DETECTED Final   Bordetella pertussis NOT DETECTED NOT DETECTED Final   Chlamydophila pneumoniae NOT DETECTED NOT DETECTED Final   Mycoplasma pneumoniae NOT DETECTED NOT DETECTED Final    Comment: Performed at [facility], [place] [address], [place], [place] [address]    Procedures/Studies: CT ANGIO CHEST PE W OR WO CONTRAST  Result Date: 03/21/2020 CLINICAL DATA:  Hypoxia. EXAM: CT ANGIOGRAPHY CHEST WITH CONTRAST TECHNIQUE: Multidetector CT imaging of the chest was performed using the standard protocol during bolus administration of intravenous contrast. Multiplanar CT image reconstructions and MIPs were obtained to evaluate the vascular anatomy. CONTRAST:  75m OMNIPAQUE IOHEXOL 350 MG/ML SOLN COMPARISON:  February 10, 2020 FINDINGS: Cardiovascular: There is moderate severity calcification of the aortic arch. Satisfactory opacification of the pulmonary arteries to the segmental level. No evidence of pulmonary embolism. Normal heart size. No pericardial effusion. Mediastinum/Nodes: No enlarged mediastinal, hilar, or axillary lymph nodes. Thyroid gland,  trachea, and esophagus demonstrate no significant findings. Lungs/Pleura: Stable subcentimeter noncalcified nodular areas are present, bilaterally. A trace amount of atelectasis is seen within the posterior aspect of the right lung base. There is no evidence of a pleural effusion or pneumothorax. Upper Abdomen: A stable 8 mm focus of parenchymal low attenuation is seen within the lateral aspect of the right lobe of the liver (axial CT image 119, CT series number 4). Musculoskeletal: Numerous stable, ill-defined sclerotic lesions of various sizes are seen scattered throughout the thoracic spine, ribs and sternum. Review of the MIP images confirms the above findings. IMPRESSION: 1. No evidence of pulmonary embolus. 2. Stable subcentimeter bilateral noncalcified nodular areas. 3. Findings consistent with osseous metastasis. 4. Small, stable hepatic cyst versus hemangioma. Aortic Atherosclerosis (ICD10-I70.0). Electronically Signed   By: TVirgina NorfolkM.D.   On: 03/21/2020 17:44   DG Chest Port 1 View  Result Date: 03/20/2020 CLINICAL DATA:  Cough and short of breath, hypoxia, history of breast cancer EXAM: PORTABLE CHEST 1 VIEW COMPARISON:  02/07/2020, 02/10/2020 FINDINGS: Single frontal view of the chest demonstrates an unremarkable cardiac silhouette. Mild diffuse interstitial prominence unchanged. No acute airspace disease, effusion, or pneumothorax. The sclerotic bony lesions seen on CT are less apparent by radiograph. No acute fracture. IMPRESSION: 1. Stable chest, no acute airspace disease. 2. Sclerotic bony metastases seen on previous CTs are less apparent by radiograph. Electronically Signed   By: MRanda NgoM.D.   On: 03/20/2020 22:01     Time coordinating discharge: Over 30 minutes    DDwyane Dee MD  [facility] 03/26/2020, 1:10 PM

## 2020-03-26 NOTE — Progress Notes (Addendum)
Pt discharged to home with hospice via [facility]. Discharge instructions and medication education provided to pt.

## 2020-03-26 NOTE — TOC Transition Note (Addendum)
Transition of Care [facility]) - CM/SW Discharge Note   Patient Details  Name: Kathleen Schneider MRN: [id number] Date of Birth: 07/03/1940  [facility] CM/SW Contact:  Lia Hopping, [place] Phone Number: 03/26/2020, 11:31 AM   Clinical Narrative:    RE: Home w/Hospice services Patient medically stable to discharge today.  CSW reached out to the patient relative Kathleen Schneider. She has arranged for the patient to have caregivers to arrive at 2:00pm today.  CSW confirm with [place] the hospice agency will meet with pt.and family tomorrow at 1:00pm. [place] will reach out to family and coordinate.   Physician completed Peer to Peer for EMS transport.  EMS Approval Authorization: [id number]  EMS to transport home.    Final next level of care: Home w Hospice Care Barriers to Discharge: Barriers Resolved   Patient Goals and CMS Choice Patient states their goals for this hospitalization and ongoing recovery are:: "Can you help me get into a place that can give me help?"   Choice offered to / list presented to : Patient  Discharge Placement                       Discharge Plan and Services   Discharge Planning Services: CM Consult Post Acute Care Choice: [place]: Hospice and [place] Date [place]: 03/26/20 Time HH Agency Contacted: 74 Representative spoke with at [place]: [place] ([place]) Interventions     Readmission Risk Interventions Readmission Risk Prevention Plan 12/10/2019  Medication Screening Complete  Transportation Screening Complete  Some recent data might be hidden

## 2020-03-26 NOTE — Consult Note (Signed)
Palliative Care Consult Note  Kathleen Schneider is an 79 yo woman with multiple chronic medical problems including CHF, CKD and depression who has newly diagnosed stage IV widely metastatic breast cancer diagnosed last month incidentally during a hospitalization. Please see palliative consultation done on 02/11/20 for many details regarding her pre-hospital admission status and the condition of her home. I spoke with patient's HCPOA Faythe Ghee and she was on the phone for most of my visit with Kathleen Schneider. [name] is also the person who helped get the patient's home cleaned out so it was in livable condition and get her caregivers following her last nursing home stay.  I discussed with Kathleen Schneider her diagnosis of widely metastatic breast cancer and how that is impacting her health- she complains of severe fatigue and not being able to get OOB ir care for herself. I had a direct and honest conversation about her trajectory of illness and reasonable goals. She is not a candidate for systemic chemotherapy and in her current debilitated state would have difficulty making outside appointments and with tolerating any chemo or aggressive treatment. Her current PPS is 30%. Given this her prognosis is <6 months and realistically her functional status is unlikely to improve beyond her current state.   There is a good possibility that the lesions involving her RIGHT femoral bone seen on PET scan could lead to pathologic fracture in the near future with weight bearing or aggressive PT rehab efforts and these need further characterization to determine a safe activity level at minimum. Fortunately [facility]usha does not endorse severe pain but I believe that pain is likely limiting her activity level.  Gift was very dyspneic during my visit. She had audible congestion and was wheezing and doing pursed lip breathing -probably due to her URI and also anxiety. Her saturations and vitals were stable despite this distress but requested  neb and anxiety prn be given.  Kathleen Schneider is also very tearful during my visit and is processing the prognostic news that I shared today but she tells me it will help her make plans and also use the money she has for her care.   I recommended two options:  [place] w/ Hospice or/   Home with Hospice and minimum of 12 hours preferably 24 hours of private duty custodial care.   Outside of these options I do not believe that she will receive care that is safe and provides the quality that she needs facing her terminal diagnosis. She is going to consider these and is agreeable now to [facility] care.  Spoke with Barbaraann Rondo from [facility] and updated him on change in goals of care and discharge plans.  Will follow.  Lane Hacker, DO Palliative Medicine  Time: 70 min Greater than 50%  of this time was spent counseling and coordinating care related to the above assessment and plan.

## 2020-03-26 NOTE — Plan of Care (Signed)
  Problem: Clinical Measurements: Goal: Will remain free from infection Outcome: Progressing Goal: Respiratory complications will improve Outcome: Progressing   Problem: Nutrition: Goal: Adequate nutrition will be maintained Outcome: Progressing   

## 2020-04-05 DIAGNOSIS — I5032 Chronic diastolic (congestive) heart failure: Secondary | ICD-10-CM | POA: Diagnosis not present

## 2020-04-05 DIAGNOSIS — N1832 Chronic kidney disease, stage 3b: Secondary | ICD-10-CM | POA: Diagnosis not present

## 2020-04-05 DIAGNOSIS — N3281 Overactive bladder: Secondary | ICD-10-CM | POA: Diagnosis not present

## 2020-04-05 DIAGNOSIS — F330 Major depressive disorder, recurrent, mild: Secondary | ICD-10-CM | POA: Diagnosis not present

## 2020-04-05 DIAGNOSIS — B0229 Other postherpetic nervous system involvement: Secondary | ICD-10-CM | POA: Diagnosis not present

## 2020-04-05 DIAGNOSIS — C50919 Malignant neoplasm of unspecified site of unspecified female breast: Secondary | ICD-10-CM | POA: Diagnosis not present

## 2020-04-05 DIAGNOSIS — C7951 Secondary malignant neoplasm of bone: Secondary | ICD-10-CM | POA: Diagnosis not present

## 2020-08-15 ENCOUNTER — Telehealth: Payer: Self-pay | Admitting: Hematology and Oncology

## 2020-08-15 NOTE — Telephone Encounter (Signed)
I have reviewed the plan of care today with the hospice nurse, Jonna Clark who is taking care of the patient Over the past 6 months, she has some mild improved performance status but still require caregivers and assistance in most activities of daily living The patient is noted to have some memory issues Despite improved performance status, overall, I do not feel that the patient is a candidate for palliative treatment.   The patient has stage IV disease and even with her improved performance status, she is not a candidate for systemic chemotherapy.   I am concerned that systemic chemotherapy will impact on her quality of life that she has enjoyed this past few months and predispose her with increased risk of infection and toxicity that could potentially diminish her quality of life or shorten her life span  At this point in time, I will not bring the patient back to the cancer center for further work-up and palliative chemotherapy I suggest to the hospice nurse that she should continue on palliative care

## 2020-08-15 NOTE — Telephone Encounter (Signed)
I received a referral from her primary care doctor requested a follow-up appointment on her diagnosis of metastatic breast cancer The patient was placed on hospice care since last year as she has very poor performance status, estimated ECOG performance status score of 3-4 and is not a candidate for palliative treatment I spoke with her primary care doctor last week and felt that follow-up oncology appointment is not needed as the patient is not a candidate for palliative treatment and it would be very difficult for the patient to travel to the outpatient clinic for follow-up on the disease that she is not receiving treatment Her primary care doctor explained to me that the request come from hospice service I will reach out to hospice service today to inquire the rationale behind the oncology follow-up

## 2020-08-16 ENCOUNTER — Telehealth: Payer: Self-pay | Admitting: Hematology and Oncology

## 2020-08-16 NOTE — Telephone Encounter (Signed)
I spoke with Dr. Lyman Speller from palliative care and hospice program He is a medical director who oversees her care He is wondering whether the patient would benefit from antiestrogen therapy given her recent improved performance status I shared with him my thoughts and what would involve putting her on treatment, including repeat imaging study, blood work and long-term follow-up He will discuss this further with her primary care doctor

## 2021-04-12 IMAGING — CT CT ANGIO CHEST
2 of 6 series · 18 of 36 positions shown · IV contrast (omnipaque)
Comparison: February 10, 2020

CLINICAL DATA: Hypoxia.

EXAM:
CT ANGIOGRAPHY CHEST WITH CONTRAST
TECHNIQUE: Multidetector CT imaging of the chest was performed using the
standard protocol during bolus administration of intravenous
contrast. Multiplanar CT image reconstructions and MIPs were
obtained to evaluate the vascular anatomy.
CONTRAST:  80mL OMNIPAQUE IOHEXOL 350 MG/ML SOLN

[Series 5: thins · axial · 0.66mm/px · z∈[+1554,+1794]mm · 17 of 270 slices shown]
[im 15/270  lung]
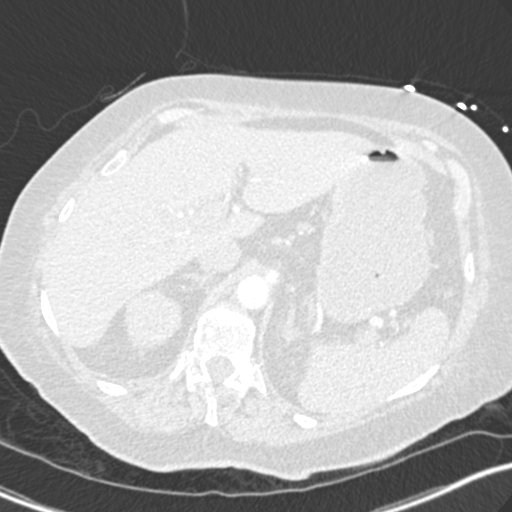
[im 30/270  mediastinal]
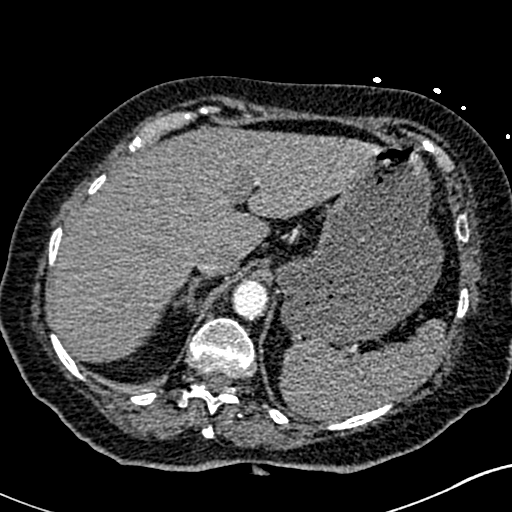
[im 45/270  lung]
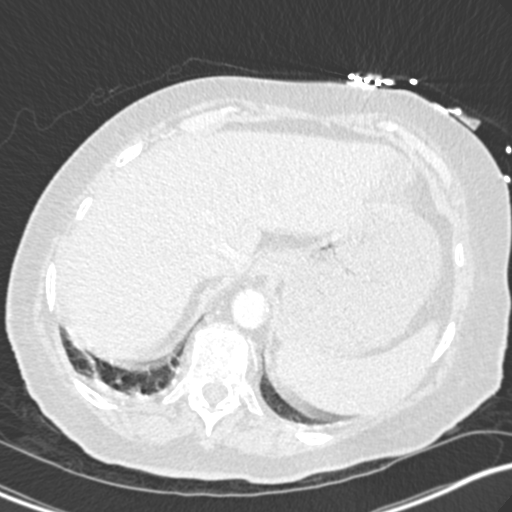
[im 60/270  mediastinal]
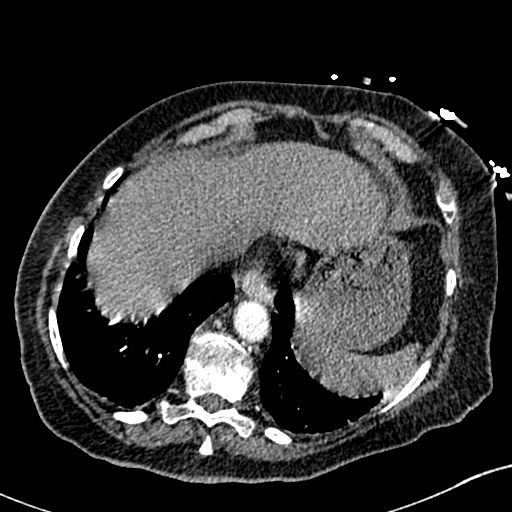
[im 75/270  lung]
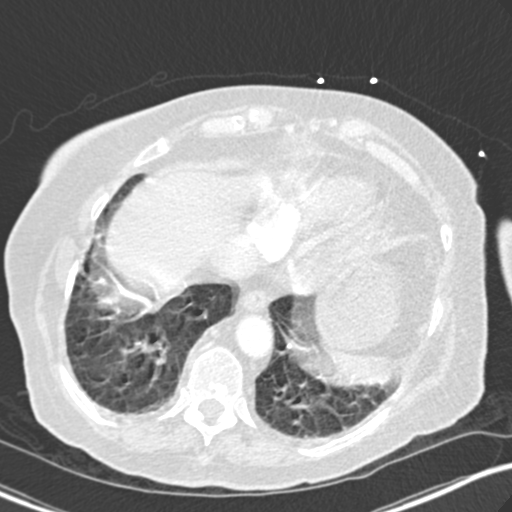
[im 90/270  mediastinal]
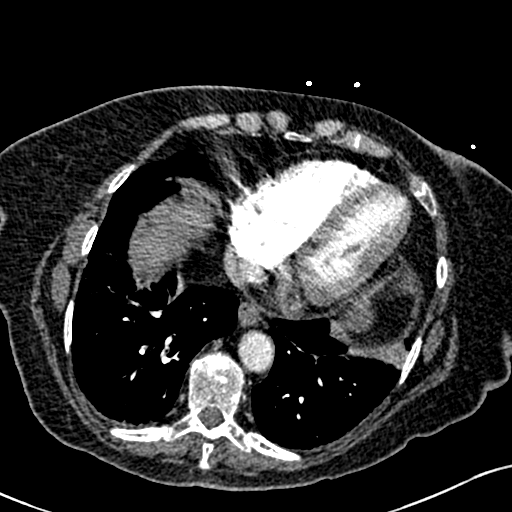
[im 105/270  lung]
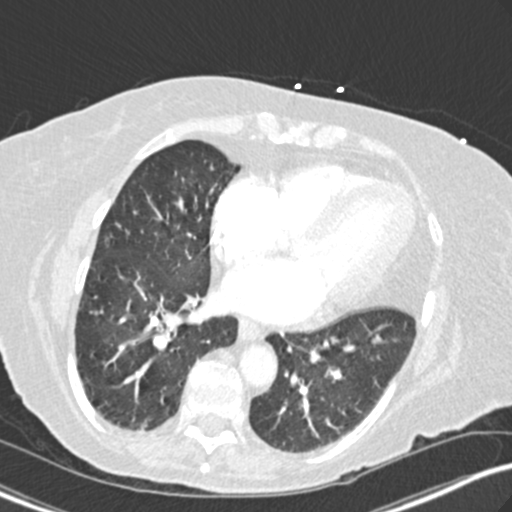
[im 120/270  mediastinal]
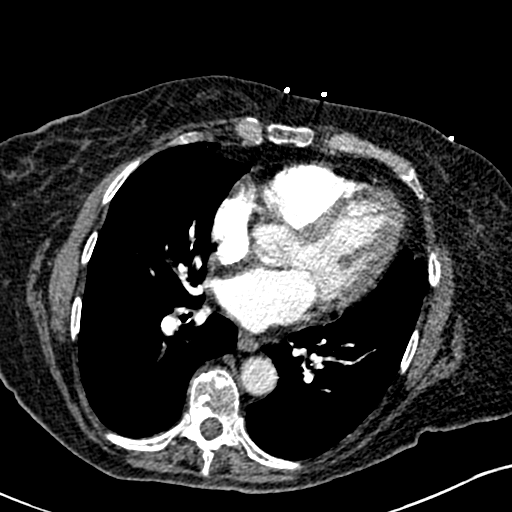
[im 135/270  lung]
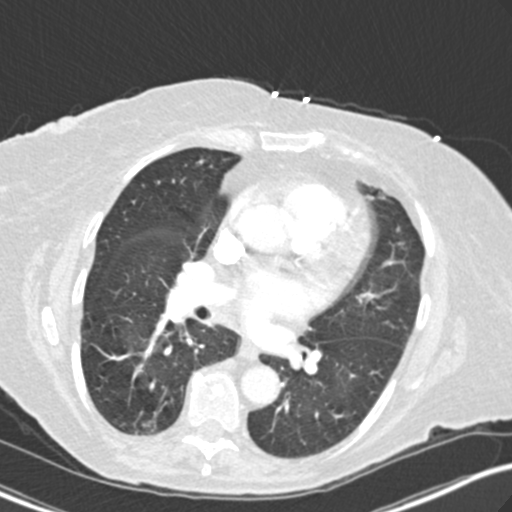
[im 150/270  mediastinal]
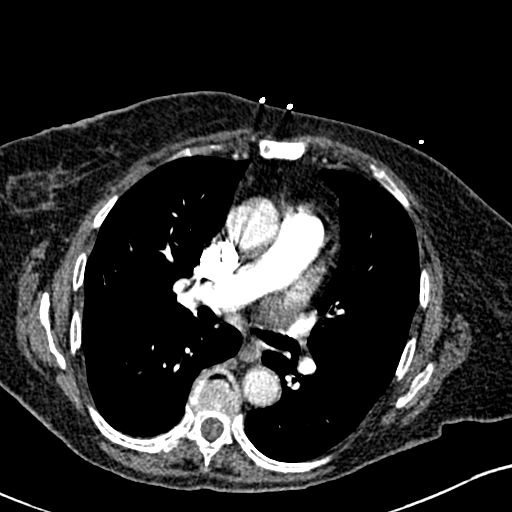
[im 165/270  lung]
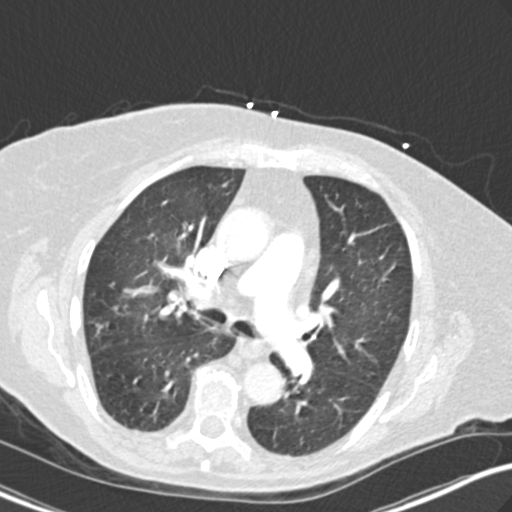
[im 180/270  mediastinal]
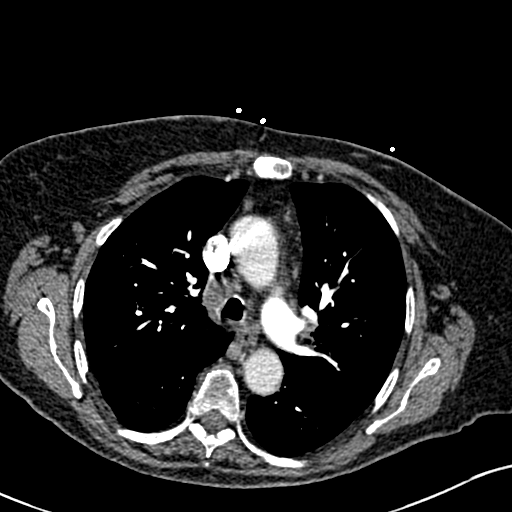
[im 195/270  lung]
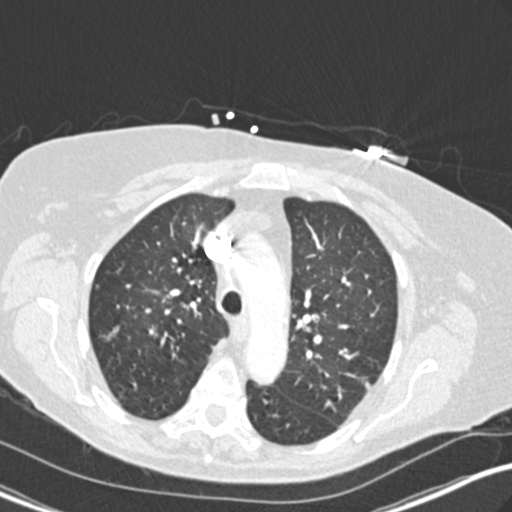
[im 210/270  mediastinal]
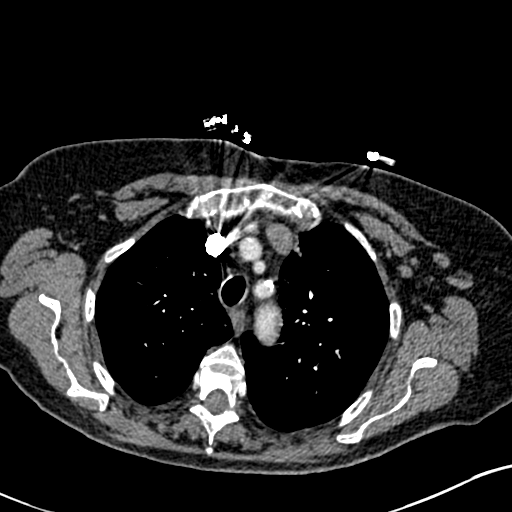
[im 225/270  lung]
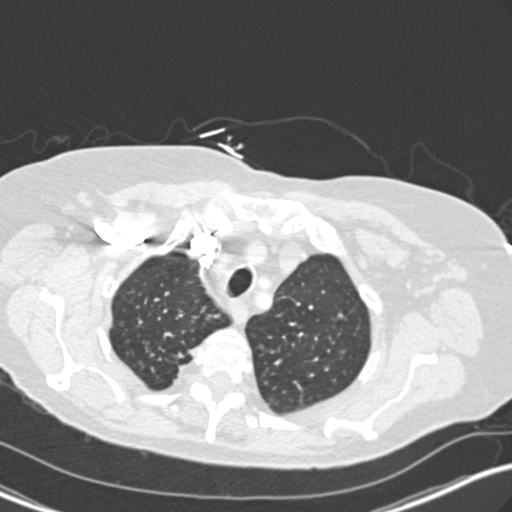
[im 240/270  mediastinal]
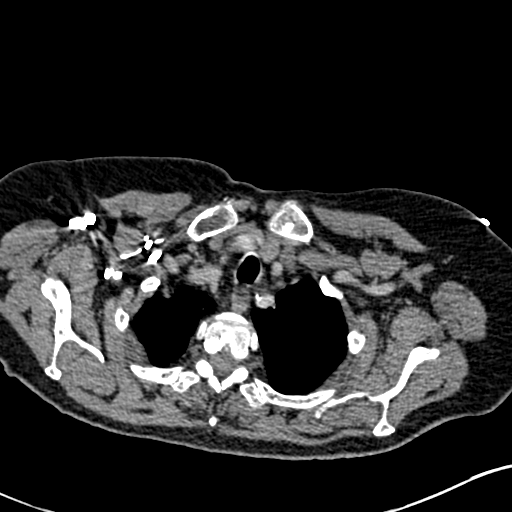
[im 255/270  lung]
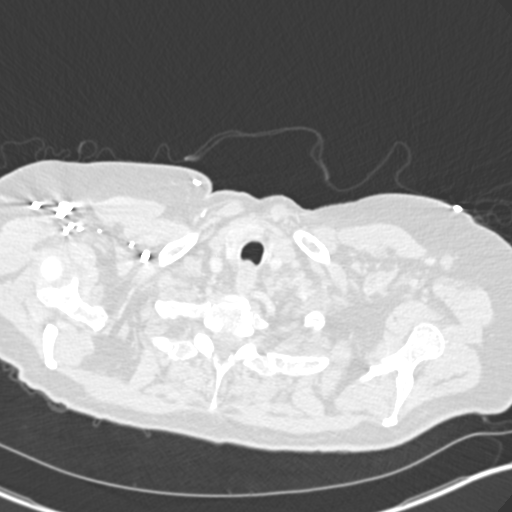

[Series 7: coronal mpr · coronal · 0.54mm/px · 1 of 129 slices shown]
[im 65/129  mediastinal]
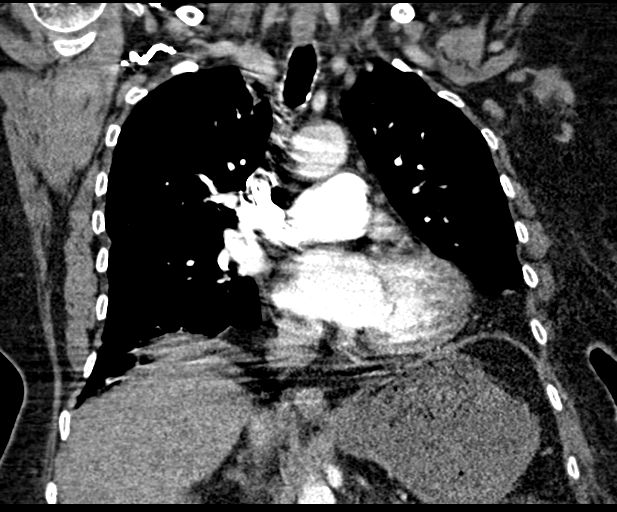

[18 of 36 positions shown; findings below may reference images not displayed]

FINDINGS: Cardiovascular: There is moderate severity calcification of the
aortic arch. Satisfactory opacification of the pulmonary arteries to
the segmental level. No evidence of pulmonary embolism. Normal heart
size. No pericardial effusion.

Mediastinum/Nodes: No enlarged mediastinal, hilar, or axillary lymph
nodes. Thyroid gland, trachea, and esophagus demonstrate no
significant findings.

Lungs/Pleura: Stable subcentimeter noncalcified nodular areas are
present, bilaterally.

A trace amount of atelectasis is seen within the posterior aspect of
the right lung base. There is no evidence of a pleural effusion or
pneumothorax.

Upper Abdomen: A stable 8 mm focus of parenchymal low attenuation is
seen within the lateral aspect of the right lobe of the liver (axial
CT image 119, CT series number 4).

Musculoskeletal: Numerous stable, ill-defined sclerotic lesions of
various sizes are seen scattered throughout the thoracic spine, ribs
and sternum.

Review of the MIP images confirms the above findings.
IMPRESSION: 1. No evidence of pulmonary embolus.
2. Stable subcentimeter bilateral noncalcified nodular areas.
3. Findings consistent with osseous metastasis.
4. Small, stable hepatic cyst versus hemangioma.

Aortic Atherosclerosis ([id number]).

## 2021-07-25 DIAGNOSIS — C50919 Malignant neoplasm of unspecified site of unspecified female breast: Secondary | ICD-10-CM | POA: Diagnosis not present

## 2021-07-25 DIAGNOSIS — G8929 Other chronic pain: Secondary | ICD-10-CM | POA: Diagnosis not present

## 2021-07-25 DIAGNOSIS — K589 Irritable bowel syndrome without diarrhea: Secondary | ICD-10-CM | POA: Diagnosis not present

## 2021-07-25 DIAGNOSIS — C7952 Secondary malignant neoplasm of bone marrow: Secondary | ICD-10-CM | POA: Diagnosis not present

## 2021-07-25 DIAGNOSIS — F3342 Major depressive disorder, recurrent, in full remission: Secondary | ICD-10-CM | POA: Diagnosis not present

## 2021-07-25 DIAGNOSIS — C7951 Secondary malignant neoplasm of bone: Secondary | ICD-10-CM | POA: Diagnosis not present

## 2021-07-25 DIAGNOSIS — R32 Unspecified urinary incontinence: Secondary | ICD-10-CM | POA: Diagnosis not present

## 2021-07-25 DIAGNOSIS — M1990 Unspecified osteoarthritis, unspecified site: Secondary | ICD-10-CM | POA: Diagnosis not present

## 2021-09-12 DIAGNOSIS — I5032 Chronic diastolic (congestive) heart failure: Secondary | ICD-10-CM | POA: Diagnosis not present

## 2021-09-12 DIAGNOSIS — N3281 Overactive bladder: Secondary | ICD-10-CM | POA: Diagnosis not present

## 2021-09-12 DIAGNOSIS — C50919 Malignant neoplasm of unspecified site of unspecified female breast: Secondary | ICD-10-CM | POA: Diagnosis not present

## 2021-09-12 DIAGNOSIS — F330 Major depressive disorder, recurrent, mild: Secondary | ICD-10-CM | POA: Diagnosis not present

## 2021-09-12 DIAGNOSIS — B0229 Other postherpetic nervous system involvement: Secondary | ICD-10-CM | POA: Diagnosis not present

## 2021-09-12 DIAGNOSIS — C7951 Secondary malignant neoplasm of bone: Secondary | ICD-10-CM | POA: Diagnosis not present

## 2022-05-23 DIAGNOSIS — Z0000 Encounter for general adult medical examination without abnormal findings: Secondary | ICD-10-CM | POA: Diagnosis not present

## 2022-05-23 DIAGNOSIS — C50919 Malignant neoplasm of unspecified site of unspecified female breast: Secondary | ICD-10-CM | POA: Diagnosis not present

## 2022-05-23 DIAGNOSIS — I5032 Chronic diastolic (congestive) heart failure: Secondary | ICD-10-CM | POA: Diagnosis not present

## 2022-05-23 DIAGNOSIS — F330 Major depressive disorder, recurrent, mild: Secondary | ICD-10-CM | POA: Diagnosis not present

## 2022-05-23 DIAGNOSIS — Z136 Encounter for screening for cardiovascular disorders: Secondary | ICD-10-CM | POA: Diagnosis not present

## 2022-05-23 DIAGNOSIS — N3281 Overactive bladder: Secondary | ICD-10-CM | POA: Diagnosis not present

## 2022-05-23 DIAGNOSIS — B0229 Other postherpetic nervous system involvement: Secondary | ICD-10-CM | POA: Diagnosis not present

## 2022-05-23 DIAGNOSIS — Z515 Encounter for palliative care: Secondary | ICD-10-CM | POA: Diagnosis not present

## 2022-05-23 DIAGNOSIS — E6609 Other obesity due to excess calories: Secondary | ICD-10-CM | POA: Diagnosis not present

## 2022-05-23 DIAGNOSIS — Z6837 Body mass index (BMI) 37.0-37.9, adult: Secondary | ICD-10-CM | POA: Diagnosis not present

## 2022-05-23 DIAGNOSIS — C7951 Secondary malignant neoplasm of bone: Secondary | ICD-10-CM | POA: Diagnosis not present

## 2022-05-23 DIAGNOSIS — N1832 Chronic kidney disease, stage 3b: Secondary | ICD-10-CM | POA: Diagnosis not present

## 2022-06-21 ENCOUNTER — Ambulatory Visit (INDEPENDENT_AMBULATORY_CARE_PROVIDER_SITE_OTHER): Payer: PPO | Admitting: Podiatry

## 2022-06-21 ENCOUNTER — Ambulatory Visit: Payer: PPO | Admitting: Podiatry

## 2022-06-21 DIAGNOSIS — B351 Tinea unguium: Secondary | ICD-10-CM

## 2022-06-21 DIAGNOSIS — M79675 Pain in left toe(s): Secondary | ICD-10-CM

## 2022-06-21 DIAGNOSIS — M79674 Pain in right toe(s): Secondary | ICD-10-CM | POA: Diagnosis not present

## 2022-06-21 NOTE — Progress Notes (Signed)
  Subjective:  Patient ID: Kathleen Schneider, female    DOB: 06/14/1940,  MRN: [id number]  Chief Complaint  Patient presents with   Nail Problem    Nail trin    82 y.o. female returns for the above complaint.  Patient presents with thickened elongated dystrophic mycotic toenails x 10 mild pain on palpation worse with ambulation worse with pressure she is not able to debride down herself.  She denies any other acute complaints she would like for me to debride down  Objective:  There were no vitals filed for this visit. Podiatric Exam: Vascular: dorsalis pedis and posterior tibial pulses are palpable bilateral. Capillary return is immediate. Temperature gradient is WNL. Skin turgor WNL  Sensorium: Normal Semmes Weinstein monofilament test. Normal tactile sensation bilaterally. Nail Exam: Pt has thick disfigured discolored nails with subungual debris noted bilateral entire nail hallux through fifth toenails.  Pain on palpation to the nails. Ulcer Exam: There is no evidence of ulcer or pre-ulcerative changes or infection. Orthopedic Exam: Muscle tone and strength are WNL. No limitations in general ROM. No crepitus or effusions noted.  Skin: No Porokeratosis. No infection or ulcers    Assessment & Plan:   1. Pain due to onychomycosis of toenails of both feet     Patient was evaluated and treated and all questions answered.  Onychomycosis with pain  -Nails palliatively debrided as below. -Educated on self-care  Procedure: Nail Debridement Rationale: pain  Type of Debridement: manual, sharp debridement. Instrumentation: Nail nipper, rotary burr. Number of Nails: 10  Procedures and Treatment: Consent by patient was obtained for treatment procedures. The patient understood the discussion of treatment and procedures well. All questions were answered thoroughly reviewed. Debridement of mycotic and hypertrophic toenails, 1 through 5 bilateral and clearing of subungual debris. No ulceration,  no infection noted.  Return Visit-Office Procedure: Patient instructed to return to the office for a follow up visit 3 months for continued evaluation and treatment.  Boneta Lucks, DPM    No follow-ups on file.

## 2022-09-21 ENCOUNTER — Ambulatory Visit: Payer: PPO | Admitting: Podiatry
